# Patient Record
Sex: Female | Born: 1935 | Race: White | Hispanic: No | Smoking: Never smoker
Health system: Southern US, Community
[De-identification: ages and names within clinical notes are randomized; demographics above are authoritative.]

## PROBLEM LIST (undated history)

## (undated) DIAGNOSIS — H409 Unspecified glaucoma: Secondary | ICD-10-CM

## (undated) DIAGNOSIS — J45909 Unspecified asthma, uncomplicated: Secondary | ICD-10-CM

## (undated) DIAGNOSIS — T7840XA Allergy, unspecified, initial encounter: Secondary | ICD-10-CM

## (undated) DIAGNOSIS — E785 Hyperlipidemia, unspecified: Secondary | ICD-10-CM

## (undated) HISTORY — DX: Unspecified asthma, uncomplicated: J45.909

## (undated) HISTORY — DX: Unspecified glaucoma: H40.9

## (undated) HISTORY — DX: Allergy, unspecified, initial encounter: T78.40XA

## (undated) HISTORY — DX: Hyperlipidemia, unspecified: E78.5

## (undated) HISTORY — PX: LOBECTOMY: SHX5089

## (undated) HISTORY — PX: LAMINECTOMY: SHX219

## (undated) HISTORY — PX: ABDOMINAL HYSTERECTOMY: SHX81

---

## 2005-10-04 ENCOUNTER — Encounter: Admission: RE | Admit: 2005-10-04 | Discharge: 2005-10-04 | Payer: Self-pay | Admitting: Neurology

## 2005-10-26 ENCOUNTER — Emergency Department (HOSPITAL_COMMUNITY): Admission: EM | Admit: 2005-10-26 | Discharge: 2005-10-26 | Payer: Self-pay | Admitting: Emergency Medicine

## 2005-11-24 ENCOUNTER — Encounter: Admission: RE | Admit: 2005-11-24 | Discharge: 2005-11-24 | Payer: Self-pay | Admitting: Family Medicine

## 2006-06-03 ENCOUNTER — Encounter: Admission: RE | Admit: 2006-06-03 | Discharge: 2006-06-03 | Payer: Self-pay | Admitting: Family Medicine

## 2007-08-02 IMAGING — CR DG CHEST 2V
2 series · 2 of 2 positions shown · non-contrast
Comparison: none

CLINICAL DATA: Cough and congestion

Chest 2 view:
No previous available for comparison. Linear scarring or atelectasis in the left
lower lobe. Blunting of the left costophrenic angle suggesting small effusion.
Right lung clear. Heart size normal.

[w chest pa *]
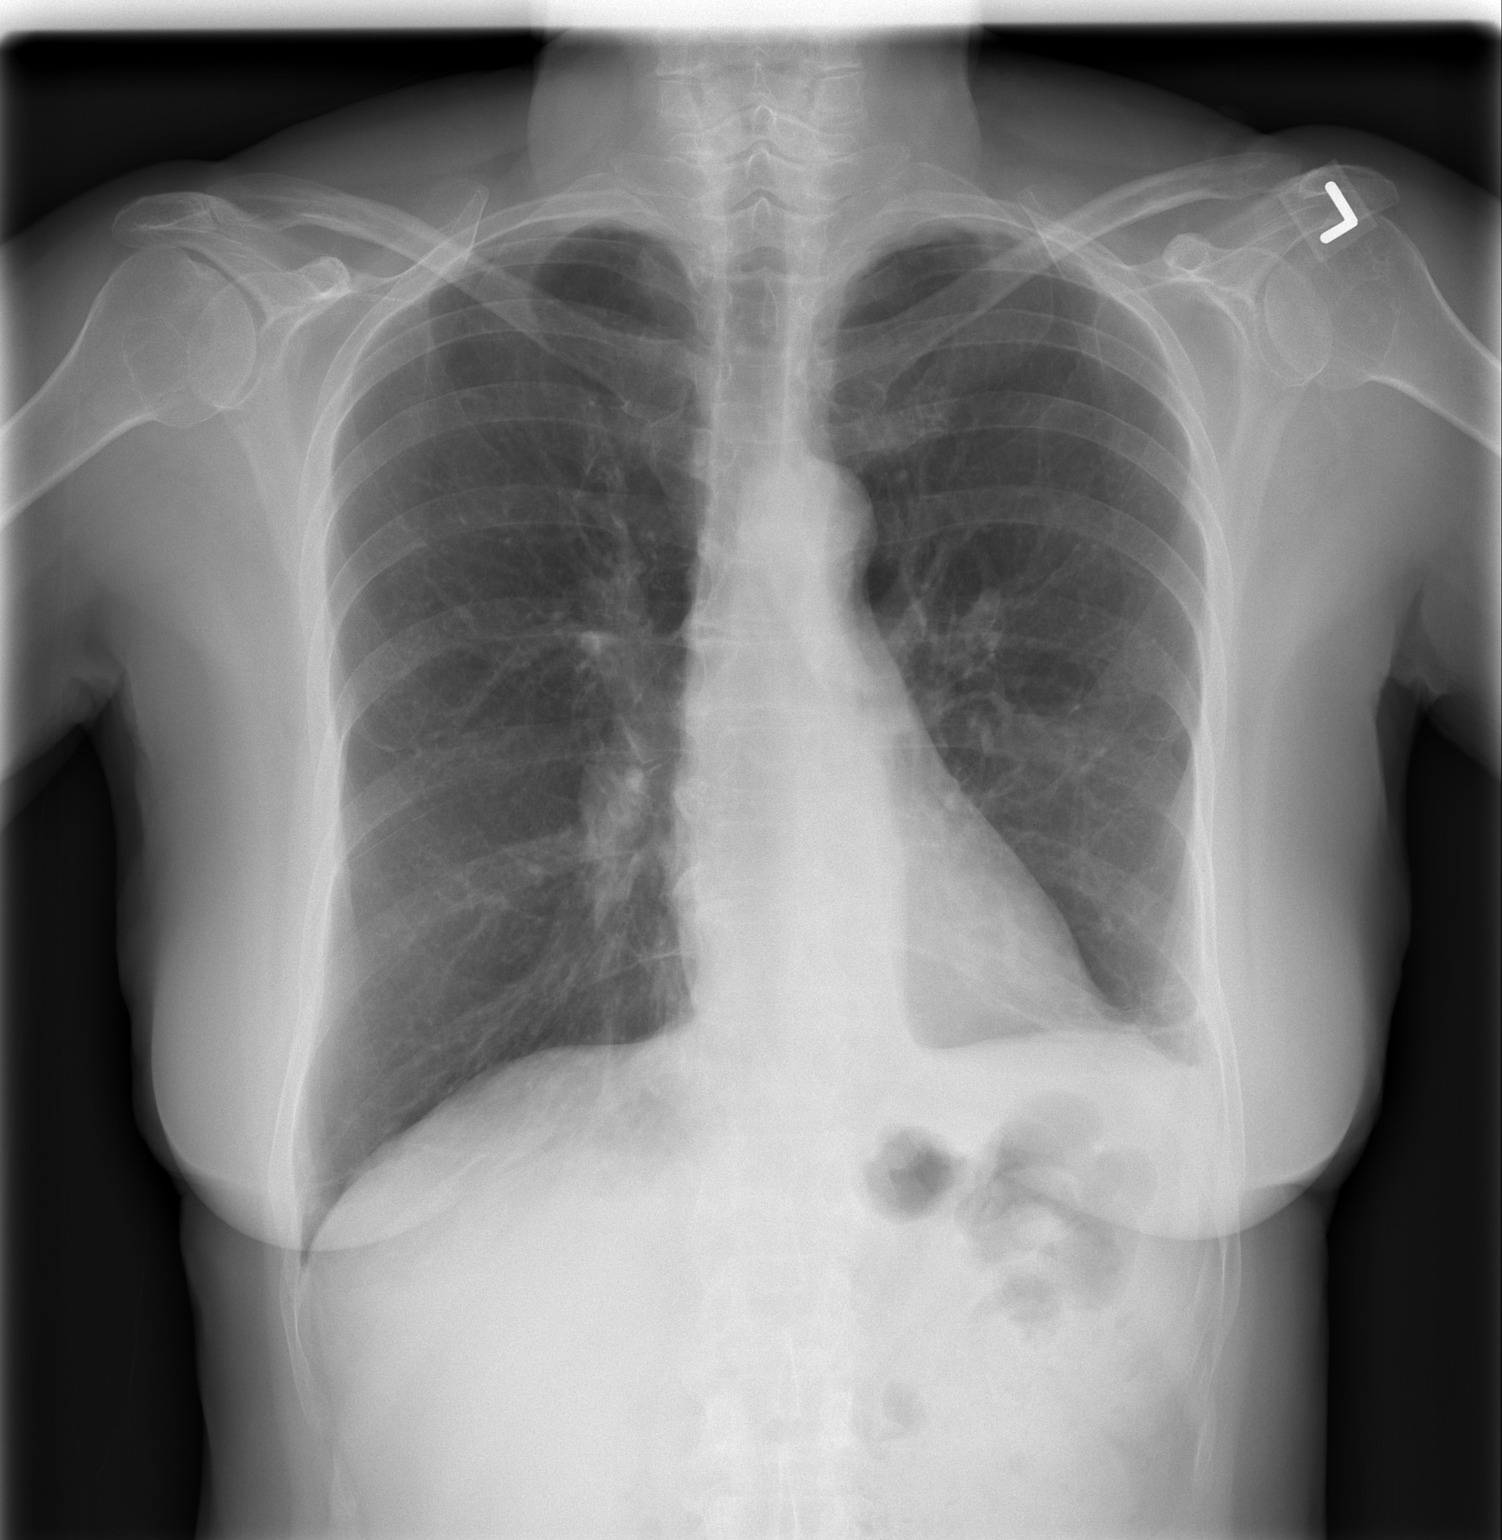

[w chest lat]
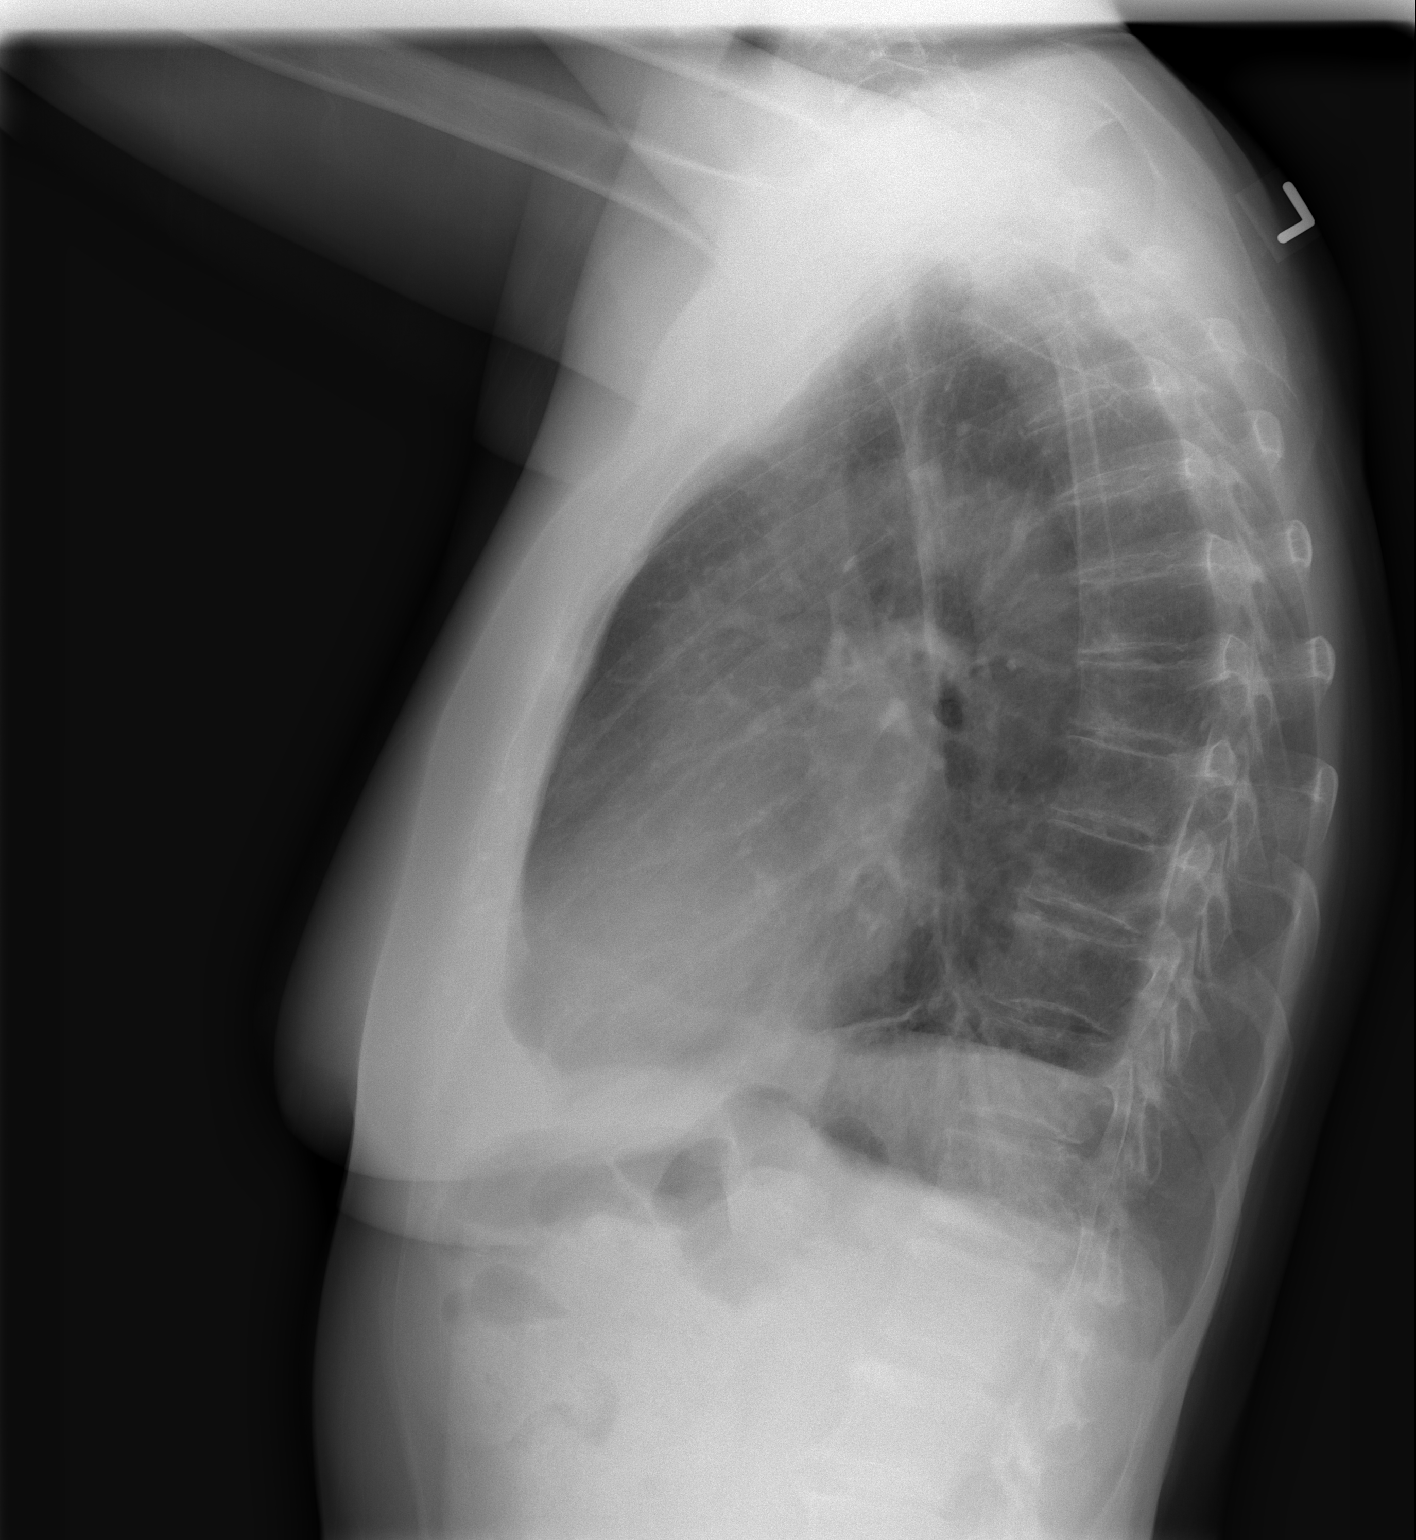

[2 of 2 positions shown; findings below may reference images not displayed]

IMPRESSION: 1. Linear scarring or atelectasis in the left lower lobe with question of small
effusion.

## 2022-03-20 DIAGNOSIS — I10 Essential (primary) hypertension: Secondary | ICD-10-CM | POA: Insufficient documentation

## 2022-03-20 HISTORY — DX: Essential (primary) hypertension: I10

## 2022-03-24 DIAGNOSIS — J449 Chronic obstructive pulmonary disease, unspecified: Secondary | ICD-10-CM | POA: Insufficient documentation

## 2022-03-24 HISTORY — DX: Chronic obstructive pulmonary disease, unspecified: J44.9

## 2022-06-27 ENCOUNTER — Other Ambulatory Visit: Payer: Self-pay

## 2022-06-27 ENCOUNTER — Emergency Department (HOSPITAL_COMMUNITY)
Admission: EM | Admit: 2022-06-27 | Discharge: 2022-06-28 | Disposition: A | Discharge status: Home / Self Care | Payer: Medicare Other | Attending: Emergency Medicine | Admitting: Emergency Medicine

## 2022-06-27 ENCOUNTER — Emergency Department (HOSPITAL_COMMUNITY): Payer: Medicare Other

## 2022-06-27 DIAGNOSIS — S0101XA Laceration without foreign body of scalp, initial encounter: Secondary | ICD-10-CM | POA: Diagnosis not present

## 2022-06-27 DIAGNOSIS — Z23 Encounter for immunization: Secondary | ICD-10-CM | POA: Insufficient documentation

## 2022-06-27 DIAGNOSIS — W01190A Fall on same level from slipping, tripping and stumbling with subsequent striking against furniture, initial encounter: Secondary | ICD-10-CM | POA: Diagnosis not present

## 2022-06-27 DIAGNOSIS — T148XXA Other injury of unspecified body region, initial encounter: Secondary | ICD-10-CM

## 2022-06-27 DIAGNOSIS — S098XXA Other specified injuries of head, initial encounter: Secondary | ICD-10-CM

## 2022-06-27 LAB — BASIC METABOLIC PANEL
Anion gap: 11 (ref 5–15)
BUN: 40 mg/dL — ABNORMAL HIGH (ref 8–23)
CO2: 23 mmol/L (ref 22–32)
Calcium: 9.8 mg/dL (ref 8.9–10.3)
Chloride: 104 mmol/L (ref 98–111)
Creatinine, Ser: 1.31 mg/dL — ABNORMAL HIGH (ref 0.44–1.00)
GFR, Estimated: 40 mL/min — ABNORMAL LOW (ref >60)
Glucose, Bld: 109 mg/dL — ABNORMAL HIGH (ref 70–99)
Potassium: 3.9 mmol/L (ref 3.5–5.1)
Sodium: 138 mmol/L (ref 135–145)

## 2022-06-27 LAB — CBC WITH DIFFERENTIAL/PLATELET
Abs Immature Granulocytes: 0.06 10*3/uL (ref 0.00–0.07)
Basophils Absolute: 0.1 10*3/uL (ref 0.0–0.1)
Basophils Relative: 0 %
Eosinophils Absolute: 0.2 10*3/uL (ref 0.0–0.5)
Eosinophils Relative: 1 %
HCT: 31.2 % — ABNORMAL LOW (ref 36.0–46.0)
Hemoglobin: 10.6 g/dL — ABNORMAL LOW (ref 12.0–15.0)
Immature Granulocytes: 1 %
Lymphocytes Relative: 14 %
Lymphs Abs: 1.9 10*3/uL (ref 0.7–4.0)
MCH: 30.8 pg (ref 26.0–34.0)
MCHC: 34 g/dL (ref 30.0–36.0)
MCV: 90.7 fL (ref 80.0–100.0)
Monocytes Absolute: 1 10*3/uL (ref 0.1–1.0)
Monocytes Relative: 7 %
Neutro Abs: 10 10*3/uL — ABNORMAL HIGH (ref 1.7–7.7)
Neutrophils Relative %: 77 %
Platelets: 378 10*3/uL (ref 150–400)
RBC: 3.44 MIL/uL — ABNORMAL LOW (ref 3.87–5.11)
RDW: 13.1 % (ref 11.5–15.5)
WBC: 13.1 10*3/uL — ABNORMAL HIGH (ref 4.0–10.5)
nRBC: 0 % (ref 0.0–0.2)

## 2022-06-27 MED ORDER — ACETAMINOPHEN 325 MG PO TABS
650.0000 mg | ORAL_TABLET | Freq: Once | ORAL | Status: AC
  Administered 2022-06-27: 650 mg via ORAL
  Filled 2022-06-27: qty 2

## 2022-06-27 MED ORDER — TETANUS-DIPHTH-ACELL PERTUSSIS 5-2.5-18.5 LF-MCG/0.5 IM SUSY
0.5000 mL | PREFILLED_SYRINGE | Freq: Once | INTRAMUSCULAR | Status: AC
  Administered 2022-06-27: 0.5 mL via INTRAMUSCULAR
  Filled 2022-06-27: qty 0.5

## 2022-06-27 NOTE — ED Triage Notes (Signed)
BIB EMS from an independent living facility for a fall from standing, -LOC. Per medic pt has a right 1-2in hematoma above eyebrow, has soaked through abdominal pads. No blood thinners. 100cc of blood estimated on floor on scene

## 2022-06-27 NOTE — ED Provider Notes (Signed)
[facility] EMERGENCY DEPARTMENT Provider Note   CSN: [id number] Arrival date & time: 06/27/22  2222     History  Chief Complaint  Patient presents with   Nicole [name] is a 87 y.o. female.  Patient is an 87 year old female presenting for independent living facility after tripping on her slipper and a rug.  Patient states she fell forward hitting her forehead on her bed.  Patient has large hematoma to the right supraorbital ridge and a laceration.  It was reported to have approximately 100 cc of blood on the floor on EMS arrival.  Patient denies any LOC.  Denies any sensation or motor deficits at this time.  Denies any other bony pain.  Not on blood thinners.  The history is provided by the patient. No language interpreter was used.  Fall Pertinent negatives include no chest pain, no abdominal pain and no shortness of breath.       Home Medications Prior to Admission medications   Not on File      Allergies    Codeine and Penicillins    Review of Systems   Review of Systems  Constitutional:  Negative for chills and fever.  HENT:  Negative for ear pain and sore throat.   Eyes:  Negative for pain and visual disturbance.  Respiratory:  Negative for cough and shortness of breath.   Cardiovascular:  Negative for chest pain and palpitations.  Gastrointestinal:  Negative for abdominal pain and vomiting.  Genitourinary:  Negative for dysuria and hematuria.  Musculoskeletal:  Negative for arthralgias and back pain.  Skin:  Positive for wound. Negative for color change and rash.  Neurological:  Negative for seizures and syncope.  All other systems reviewed and are negative.   Physical Exam Updated Vital Signs BP (!) 149/68 (BP Location: Right Arm)   Pulse (!) 56   Resp 18   SpO2 95%  Physical Exam Vitals and nursing note reviewed.  Constitutional:      General: She is not in acute distress.    Appearance: She is well-developed.  HENT:      Head: Normocephalic.   Eyes:     General: Lids are normal. Vision grossly intact.     Conjunctiva/sclera: Conjunctivae normal.     Pupils: Pupils are equal, round, and reactive to light.     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   Cardiovascular:     Rate and Rhythm: Normal rate and regular rhythm.     Heart sounds: No murmur heard. Pulmonary:     Effort: Pulmonary effort is normal. No respiratory distress.     Breath sounds: Normal breath sounds.  Abdominal:     Palpations: Abdomen is soft.     Tenderness: There is no abdominal tenderness.  Musculoskeletal:        General: No swelling.     Cervical back: Neck supple.  Skin:    General: Skin is warm and dry.     Capillary Refill: Capillary refill takes less than 2 seconds.  Neurological:     Mental Status: She is alert.  Psychiatric:        Mood and Affect: Mood normal.     ED Results / Procedures / Treatments   Labs (all labs ordered are listed, but only abnormal results are displayed) Labs Reviewed  CBC WITH DIFFERENTIAL/PLATELET - Abnormal; Notable for the following components:      Result Value  WBC 13.1 (*)    RBC 3.44 (*)    Hemoglobin 10.6 (*)    HCT 31.2 (*)    Neutro Abs 10.0 (*)    All other components within normal limits  BASIC METABOLIC PANEL    EKG None  Radiology No results found.  Procedures .Marland KitchenLaceration Repair  Date/Time: 06/27/2022 11:26 PM  Performed by: Franne Forts, DO Authorized by: Franne Forts, DO   Consent:    Consent obtained:  Verbal   Consent given by:  Patient   Risks discussed:  Infection and need for additional repair   Alternatives discussed:  No treatment Universal protocol:    Immediately prior to procedure, a time out was called: no     Patient identity confirmed:  Verbally with patient, arm band and provided demographic data Anesthesia:    Anesthesia method:  None Laceration details:    Location:  Scalp    Scalp location:  Frontal   Length (cm):  2 Exploration:    Imaging outcome: foreign body not noted     Wound extent: no muscle damage noted, no nerve damage noted, no tendon damage noted and no vascular damage noted   Treatment:    Area cleansed with:  Saline   Amount of cleaning:  Standard   Irrigation method:  Syringe   Debridement:  None   Undermining:  None   Scar revision: no   Skin repair:    Repair method:  Sutures   Suture size:  5-0   Suture technique:  Simple interrupted   Number of sutures:  3 Approximation:    Approximation:  Close Repair type:    Repair type:  Simple Post-procedure details:    Procedure completion:  Tolerated well, no immediate complications     Medications Ordered in ED Medications  acetaminophen (TYLENOL) tablet 650 mg (650 mg Oral Given 06/27/22 2325)  Tdap (BOOSTRIX) injection 0.5 mL (0.5 mLs Intramuscular Given 06/27/22 2325)    ED Course/ Medical Decision Making/ A&P                           Medical Decision Making Amount and/or Complexity of Data Reviewed Labs: ordered. Radiology: ordered. ECG/medicine tests: ordered.  Risk OTC drugs. Prescription drug management.   50:63 PM  87 year old female presenting for independent living facility after tripping on her slipper and a rug.  Patient is alert oriented x3, no acute distress, afebrile, stable vital signs.  GCS of 15.  No neurovascular deficits.  Physical exam demonstrates 2 cm laceration to the right supraorbital ridge that is not actively bleeding.  No foreign bodies.  Wound irrigated and closed with approximately 3 nonabsorbable sutures.  Patient also has a large right-sided hematoma on forehead.  History of blood thinner use.  Will obtain CT head and neck due to patient's age.  If normal patient is otherwise stable for discharge home.  Patient signed out to oncoming physician while awaiting CT results.         Final Clinical Impression(s) / ED Diagnoses Final diagnoses:   Blunt head trauma, initial encounter  Hematoma  Laceration of scalp, initial encounter    Rx / DC Orders ED Discharge Orders     None         Franne Forts, DO 06/27/22 2331

## 2022-06-28 MED ORDER — AZITHROMYCIN 250 MG PO TABS: ORAL_TABLET | ORAL | 0 refills | Status: DC

## 2022-06-28 NOTE — ED Notes (Signed)
DC papers reviewed. No questions or concerns. No signs of distress. Pt assisted to wheelchair and out to lobby. Appropriate measures for safety taken. 

## 2022-07-16 DIAGNOSIS — E039 Hypothyroidism, unspecified: Secondary | ICD-10-CM | POA: Insufficient documentation

## 2022-07-16 DIAGNOSIS — J479 Bronchiectasis, uncomplicated: Secondary | ICD-10-CM

## 2022-07-16 HISTORY — DX: Bronchiectasis, uncomplicated: J47.9

## 2022-09-29 ENCOUNTER — Inpatient Hospital Stay (HOSPITAL_COMMUNITY)
Admission: EM | Admit: 2022-09-29 | Discharge: 2022-10-08 | DRG: 871 | Disposition: A | Discharge status: Skilled Nursing Facility | Payer: Medicare Other | Source: Skilled Nursing Facility | Attending: Internal Medicine | Admitting: Internal Medicine

## 2022-09-29 ENCOUNTER — Inpatient Hospital Stay (HOSPITAL_COMMUNITY): Payer: Medicare Other

## 2022-09-29 ENCOUNTER — Emergency Department (HOSPITAL_COMMUNITY): Payer: Medicare Other

## 2022-09-29 ENCOUNTER — Encounter (HOSPITAL_COMMUNITY): Payer: Self-pay

## 2022-09-29 ENCOUNTER — Other Ambulatory Visit: Payer: Self-pay

## 2022-09-29 DIAGNOSIS — J9601 Acute respiratory failure with hypoxia: Secondary | ICD-10-CM

## 2022-09-29 DIAGNOSIS — Z79899 Other long term (current) drug therapy: Secondary | ICD-10-CM

## 2022-09-29 DIAGNOSIS — I2699 Other pulmonary embolism without acute cor pulmonale: Secondary | ICD-10-CM | POA: Diagnosis present

## 2022-09-29 DIAGNOSIS — Z1152 Encounter for screening for COVID-19: Secondary | ICD-10-CM

## 2022-09-29 DIAGNOSIS — I959 Hypotension, unspecified: Secondary | ICD-10-CM | POA: Diagnosis not present

## 2022-09-29 DIAGNOSIS — E875 Hyperkalemia: Secondary | ICD-10-CM

## 2022-09-29 DIAGNOSIS — Z681 Body mass index (BMI) 19 or less, adult: Secondary | ICD-10-CM

## 2022-09-29 DIAGNOSIS — K920 Hematemesis: Secondary | ICD-10-CM

## 2022-09-29 DIAGNOSIS — E039 Hypothyroidism, unspecified: Secondary | ICD-10-CM | POA: Diagnosis present

## 2022-09-29 DIAGNOSIS — E872 Acidosis, unspecified: Secondary | ICD-10-CM | POA: Diagnosis present

## 2022-09-29 DIAGNOSIS — J479 Bronchiectasis, uncomplicated: Secondary | ICD-10-CM | POA: Diagnosis present

## 2022-09-29 DIAGNOSIS — E785 Hyperlipidemia, unspecified: Secondary | ICD-10-CM | POA: Diagnosis present

## 2022-09-29 DIAGNOSIS — J690 Pneumonitis due to inhalation of food and vomit: Secondary | ICD-10-CM | POA: Diagnosis present

## 2022-09-29 DIAGNOSIS — G9341 Metabolic encephalopathy: Secondary | ICD-10-CM | POA: Diagnosis present

## 2022-09-29 DIAGNOSIS — Z885 Allergy status to narcotic agent status: Secondary | ICD-10-CM

## 2022-09-29 DIAGNOSIS — R739 Hyperglycemia, unspecified: Secondary | ICD-10-CM | POA: Diagnosis present

## 2022-09-29 DIAGNOSIS — A419 Sepsis, unspecified organism: Principal | ICD-10-CM

## 2022-09-29 DIAGNOSIS — D62 Acute posthemorrhagic anemia: Secondary | ICD-10-CM | POA: Diagnosis present

## 2022-09-29 DIAGNOSIS — R54 Age-related physical debility: Secondary | ICD-10-CM | POA: Diagnosis present

## 2022-09-29 DIAGNOSIS — R6521 Severe sepsis with septic shock: Secondary | ICD-10-CM

## 2022-09-29 DIAGNOSIS — E871 Hypo-osmolality and hyponatremia: Secondary | ICD-10-CM | POA: Diagnosis present

## 2022-09-29 DIAGNOSIS — M19011 Primary osteoarthritis, right shoulder: Secondary | ICD-10-CM | POA: Diagnosis present

## 2022-09-29 DIAGNOSIS — I110 Hypertensive heart disease with heart failure: Secondary | ICD-10-CM | POA: Diagnosis present

## 2022-09-29 DIAGNOSIS — R001 Bradycardia, unspecified: Secondary | ICD-10-CM | POA: Diagnosis present

## 2022-09-29 DIAGNOSIS — J189 Pneumonia, unspecified organism: Secondary | ICD-10-CM

## 2022-09-29 DIAGNOSIS — M19042 Primary osteoarthritis, left hand: Secondary | ICD-10-CM | POA: Diagnosis present

## 2022-09-29 DIAGNOSIS — E440 Moderate protein-calorie malnutrition: Secondary | ICD-10-CM | POA: Insufficient documentation

## 2022-09-29 DIAGNOSIS — R7401 Elevation of levels of liver transaminase levels: Secondary | ICD-10-CM | POA: Diagnosis present

## 2022-09-29 DIAGNOSIS — I4510 Unspecified right bundle-branch block: Secondary | ICD-10-CM | POA: Diagnosis present

## 2022-09-29 DIAGNOSIS — I5033 Acute on chronic diastolic (congestive) heart failure: Secondary | ICD-10-CM | POA: Diagnosis present

## 2022-09-29 DIAGNOSIS — Z66 Do not resuscitate: Secondary | ICD-10-CM | POA: Diagnosis present

## 2022-09-29 DIAGNOSIS — N179 Acute kidney failure, unspecified: Secondary | ICD-10-CM

## 2022-09-29 DIAGNOSIS — N170 Acute kidney failure with tubular necrosis: Secondary | ICD-10-CM | POA: Diagnosis present

## 2022-09-29 DIAGNOSIS — J9600 Acute respiratory failure, unspecified whether with hypoxia or hypercapnia: Secondary | ICD-10-CM

## 2022-09-29 DIAGNOSIS — Z7189 Other specified counseling: Secondary | ICD-10-CM

## 2022-09-29 DIAGNOSIS — Z781 Physical restraint status: Secondary | ICD-10-CM

## 2022-09-29 DIAGNOSIS — J188 Other pneumonia, unspecified organism: Secondary | ICD-10-CM

## 2022-09-29 DIAGNOSIS — Z880 Allergy status to penicillin: Secondary | ICD-10-CM

## 2022-09-29 LAB — URINALYSIS, ROUTINE W REFLEX MICROSCOPIC
Bilirubin Urine: NEGATIVE
Glucose, UA: NEGATIVE mg/dL
Hgb urine dipstick: NEGATIVE
Ketones, ur: NEGATIVE mg/dL
Leukocytes,Ua: NEGATIVE
Nitrite: NEGATIVE
Protein, ur: NEGATIVE mg/dL
Specific Gravity, Urine: 1.003 — ABNORMAL LOW (ref 1.005–1.030)
pH: 7 (ref 5.0–8.0)

## 2022-09-29 LAB — GLUCOSE, CAPILLARY
Glucose-Capillary: 158 mg/dL — ABNORMAL HIGH (ref 70–99)
Glucose-Capillary: 159 mg/dL — ABNORMAL HIGH (ref 70–99)
Glucose-Capillary: 204 mg/dL — ABNORMAL HIGH (ref 70–99)
Glucose-Capillary: 38 mg/dL — CL (ref 70–99)
Glucose-Capillary: 40 mg/dL — CL (ref 70–99)
Glucose-Capillary: 82 mg/dL (ref 70–99)
Glucose-Capillary: 84 mg/dL (ref 70–99)

## 2022-09-29 LAB — HEMOGLOBIN A1C
Hgb A1c MFr Bld: 6.3 % — ABNORMAL HIGH (ref 4.8–5.6)
Mean Plasma Glucose: 134 mg/dL

## 2022-09-29 LAB — I-STAT ARTERIAL BLOOD GAS, ED
Acid-base deficit: 11 mmol/L — ABNORMAL HIGH (ref 0.0–2.0)
Bicarbonate: 14.4 mmol/L — ABNORMAL LOW (ref 20.0–28.0)
Calcium, Ion: 1.06 mmol/L — ABNORMAL LOW (ref 1.15–1.40)
HCT: 27 % — ABNORMAL LOW (ref 36.0–46.0)
Hemoglobin: 9.2 g/dL — ABNORMAL LOW (ref 12.0–15.0)
O2 Saturation: 100 %
Patient temperature: 92.3
Potassium: 6.4 mmol/L (ref 3.5–5.1)
Sodium: 120 mmol/L — ABNORMAL LOW (ref 135–145)
TCO2: 15 mmol/L — ABNORMAL LOW (ref 22–32)
pCO2 arterial: 25.5 mmHg — ABNORMAL LOW (ref 32–48)
pH, Arterial: 7.343 — ABNORMAL LOW (ref 7.35–7.45)
pO2, Arterial: 340 mmHg — ABNORMAL HIGH (ref 83–108)

## 2022-09-29 LAB — BASIC METABOLIC PANEL
Anion gap: 12 (ref 5–15)
Anion gap: 10 (ref 5–15)
Anion gap: 12 (ref 5–15)
BUN: 26 mg/dL — ABNORMAL HIGH (ref 8–23)
BUN: 29 mg/dL — ABNORMAL HIGH (ref 8–23)
BUN: 29 mg/dL — ABNORMAL HIGH (ref 8–23)
CO2: 18 mmol/L — ABNORMAL LOW (ref 22–32)
CO2: 19 mmol/L — ABNORMAL LOW (ref 22–32)
CO2: 18 mmol/L — ABNORMAL LOW (ref 22–32)
Calcium: 8.2 mg/dL — ABNORMAL LOW (ref 8.9–10.3)
Calcium: 8.5 mg/dL — ABNORMAL LOW (ref 8.9–10.3)
Calcium: 8.7 mg/dL — ABNORMAL LOW (ref 8.9–10.3)
Chloride: 95 mmol/L — ABNORMAL LOW (ref 98–111)
Chloride: 96 mmol/L — ABNORMAL LOW (ref 98–111)
Chloride: 98 mmol/L (ref 98–111)
Creatinine, Ser: 1.77 mg/dL — ABNORMAL HIGH (ref 0.44–1.00)
Creatinine, Ser: 1.72 mg/dL — ABNORMAL HIGH (ref 0.44–1.00)
Creatinine, Ser: 1.8 mg/dL — ABNORMAL HIGH (ref 0.44–1.00)
GFR, Estimated: 28 mL/min — ABNORMAL LOW (ref >60)
GFR, Estimated: 29 mL/min — ABNORMAL LOW (ref >60)
GFR, Estimated: 27 mL/min — ABNORMAL LOW (ref >60)
Glucose, Bld: 234 mg/dL — ABNORMAL HIGH (ref 70–99)
Glucose, Bld: 139 mg/dL — ABNORMAL HIGH (ref 70–99)
Glucose, Bld: 28 mg/dL — CL (ref 70–99)
Potassium: 5.5 mmol/L — ABNORMAL HIGH (ref 3.5–5.1)
Potassium: 5.8 mmol/L — ABNORMAL HIGH (ref 3.5–5.1)
Potassium: 4.6 mmol/L (ref 3.5–5.1)
Sodium: 125 mmol/L — ABNORMAL LOW (ref 135–145)
Sodium: 125 mmol/L — ABNORMAL LOW (ref 135–145)
Sodium: 128 mmol/L — ABNORMAL LOW (ref 135–145)

## 2022-09-29 LAB — I-STAT VENOUS BLOOD GAS, ED
Acid-base deficit: 9 mmol/L — ABNORMAL HIGH (ref 0.0–2.0)
Bicarbonate: 17.3 mmol/L — ABNORMAL LOW (ref 20.0–28.0)
Calcium, Ion: 1.08 mmol/L — ABNORMAL LOW (ref 1.15–1.40)
HCT: 30 % — ABNORMAL LOW (ref 36.0–46.0)
Hemoglobin: 10.2 g/dL — ABNORMAL LOW (ref 12.0–15.0)
O2 Saturation: 43 %
Potassium: 6.4 mmol/L (ref 3.5–5.1)
Sodium: 122 mmol/L — ABNORMAL LOW (ref 135–145)
TCO2: 18 mmol/L — ABNORMAL LOW (ref 22–32)
pCO2, Ven: 37.5 mmHg — ABNORMAL LOW (ref 44–60)
pH, Ven: 7.272 (ref 7.25–7.43)
pO2, Ven: 27 mmHg — CL (ref 32–45)

## 2022-09-29 LAB — CBC
HCT: 28.1 % — ABNORMAL LOW (ref 36.0–46.0)
HCT: 25.7 % — ABNORMAL LOW (ref 36.0–46.0)
Hemoglobin: 9.3 g/dL — ABNORMAL LOW (ref 12.0–15.0)
Hemoglobin: 8.4 g/dL — ABNORMAL LOW (ref 12.0–15.0)
MCH: 29.2 pg (ref 26.0–34.0)
MCH: 28.6 pg (ref 26.0–34.0)
MCHC: 33.1 g/dL (ref 30.0–36.0)
MCHC: 32.7 g/dL (ref 30.0–36.0)
MCV: 88.1 fL (ref 80.0–100.0)
MCV: 87.4 fL (ref 80.0–100.0)
Platelets: 490 10*3/uL — ABNORMAL HIGH (ref 150–400)
Platelets: 357 10*3/uL (ref 150–400)
RBC: 3.19 MIL/uL — ABNORMAL LOW (ref 3.87–5.11)
RBC: 2.94 MIL/uL — ABNORMAL LOW (ref 3.87–5.11)
RDW: 14.3 % (ref 11.5–15.5)
RDW: 14.3 % (ref 11.5–15.5)
WBC: 20.8 10*3/uL — ABNORMAL HIGH (ref 4.0–10.5)
WBC: 18.5 10*3/uL — ABNORMAL HIGH (ref 4.0–10.5)
nRBC: 0 % (ref 0.0–0.2)
nRBC: 0 % (ref 0.0–0.2)

## 2022-09-29 LAB — ECHOCARDIOGRAM COMPLETE
AR max vel: 0.72 cm2
AV Area VTI: 0.7 cm2
AV Area mean vel: 0.66 cm2
AV Mean grad: 25 mmHg
AV Peak grad: 38.4 mmHg
Ao pk vel: 3.1 m/s
Area-P 1/2: 3.93 cm2
MV M vel: 5 m/s
MV Peak grad: 100 mmHg
MV VTI: 1.7 cm2
P 1/2 time: 823 msec
S' Lateral: 2.8 cm
Weight: 1788.37 oz

## 2022-09-29 LAB — AMMONIA: Ammonia: 27 umol/L (ref 9–35)

## 2022-09-29 LAB — LACTIC ACID, PLASMA
Lactic Acid, Venous: 5.3 mmol/L (ref 0.5–1.9)
Lactic Acid, Venous: 3.7 mmol/L (ref 0.5–1.9)

## 2022-09-29 LAB — TSH: TSH: 3.466 u[IU]/mL (ref 0.350–4.500)

## 2022-09-29 LAB — COMPREHENSIVE METABOLIC PANEL
ALT: 109 U/L — ABNORMAL HIGH (ref 0–44)
AST: 188 U/L — ABNORMAL HIGH (ref 15–41)
Albumin: 3.4 g/dL — ABNORMAL LOW (ref 3.5–5.0)
Alkaline Phosphatase: 111 U/L (ref 38–126)
Anion gap: 15 (ref 5–15)
BUN: 25 mg/dL — ABNORMAL HIGH (ref 8–23)
CO2: 17 mmol/L — ABNORMAL LOW (ref 22–32)
Calcium: 8.6 mg/dL — ABNORMAL LOW (ref 8.9–10.3)
Chloride: 93 mmol/L — ABNORMAL LOW (ref 98–111)
Creatinine, Ser: 1.92 mg/dL — ABNORMAL HIGH (ref 0.44–1.00)
GFR, Estimated: 25 mL/min — ABNORMAL LOW (ref >60)
Glucose, Bld: 229 mg/dL — ABNORMAL HIGH (ref 70–99)
Potassium: 6.6 mmol/L (ref 3.5–5.1)
Sodium: 125 mmol/L — ABNORMAL LOW (ref 135–145)
Total Bilirubin: 0.6 mg/dL (ref 0.3–1.2)
Total Protein: 6.3 g/dL — ABNORMAL LOW (ref 6.5–8.1)

## 2022-09-29 LAB — PROTIME-INR
INR: 1.1 (ref 0.8–1.2)
Prothrombin Time: 14.1 seconds (ref 11.4–15.2)

## 2022-09-29 LAB — RESP PANEL BY RT-PCR (RSV, FLU A&B, COVID)  RVPGX2
Influenza A by PCR: NEGATIVE
Influenza B by PCR: NEGATIVE
Resp Syncytial Virus by PCR: NEGATIVE
SARS Coronavirus 2 by RT PCR: NEGATIVE

## 2022-09-29 LAB — TYPE AND SCREEN
ABO/RH(D): A NEG
Antibody Screen: NEGATIVE

## 2022-09-29 LAB — MRSA NEXT GEN BY PCR, NASAL: MRSA by PCR Next Gen: NOT DETECTED

## 2022-09-29 LAB — MAGNESIUM: Magnesium: 2.1 mg/dL (ref 1.7–2.4)

## 2022-09-29 LAB — TROPONIN I (HIGH SENSITIVITY)
Troponin I (High Sensitivity): 8 ng/L (ref <18)
Troponin I (High Sensitivity): 8 ng/L (ref <18)

## 2022-09-29 LAB — ABO/RH: ABO/RH(D): A NEG

## 2022-09-29 LAB — LIPASE, BLOOD: Lipase: 32 U/L (ref 11–51)

## 2022-09-29 LAB — SODIUM, URINE, RANDOM: Sodium, Ur: 38 mmol/L

## 2022-09-29 LAB — PHOSPHORUS: Phosphorus: 3.9 mg/dL (ref 2.5–4.6)

## 2022-09-29 LAB — OSMOLALITY, URINE: Osmolality, Ur: 191 mOsm/kg — ABNORMAL LOW (ref 300–900)

## 2022-09-29 MED ORDER — FENTANYL CITRATE PF 50 MCG/ML IJ SOSY: 25.0000 ug | PREFILLED_SYRINGE | INTRAMUSCULAR | Status: DC | PRN

## 2022-09-29 MED ORDER — INSULIN ASPART 100 UNIT/ML IV SOLN
10.0000 [IU] | Freq: Once | INTRAVENOUS | Status: AC
  Administered 2022-09-29: 10 [IU] via INTRAVENOUS

## 2022-09-29 MED ORDER — SODIUM ZIRCONIUM CYCLOSILICATE 10 G PO PACK
10.0000 g | PACK | Freq: Once | ORAL | Status: AC
  Administered 2022-09-29: 10 g
  Filled 2022-09-29: qty 1

## 2022-09-29 MED ORDER — PANTOPRAZOLE SODIUM 40 MG IV SOLR
40.0000 mg | Freq: Two times a day (BID) | INTRAVENOUS | Status: DC
  Administered 2022-09-29 – 2022-10-06 (×14): 40 mg via INTRAVENOUS
  Filled 2022-09-29 (×15): qty 10

## 2022-09-29 MED ORDER — SODIUM CHLORIDE 0.9 % IV SOLN
500.0000 mg | INTRAVENOUS | Status: DC
  Administered 2022-09-29 – 2022-09-30 (×2): 500 mg via INTRAVENOUS
  Filled 2022-09-29 (×3): qty 5

## 2022-09-29 MED ORDER — PROPOFOL 1000 MG/100ML IV EMUL
5.0000 ug/kg/min | INTRAVENOUS | Status: DC
  Administered 2022-09-29: 35 ug/kg/min via INTRAVENOUS
  Administered 2022-09-29 – 2022-09-30 (×2): 30 ug/kg/min via INTRAVENOUS
  Filled 2022-09-29 (×2): qty 100

## 2022-09-29 MED ORDER — ETOMIDATE 2 MG/ML IV SOLN
INTRAVENOUS | Status: DC | PRN
  Administered 2022-09-29: 10 mg via INTRAVENOUS

## 2022-09-29 MED ORDER — CHLORHEXIDINE GLUCONATE CLOTH 2 % EX PADS
6.0000 | MEDICATED_PAD | Freq: Every day | CUTANEOUS | Status: DC
  Administered 2022-09-29 – 2022-09-30 (×2): 6 via TOPICAL

## 2022-09-29 MED ORDER — DOCUSATE SODIUM 50 MG/5ML PO LIQD: 100.0000 mg | Freq: Two times a day (BID) | ORAL | Status: DC | PRN

## 2022-09-29 MED ORDER — SODIUM CHLORIDE 0.9 % IV SOLN
2.0000 g | INTRAVENOUS | Status: AC
  Administered 2022-09-29 – 2022-10-06 (×8): 2 g via INTRAVENOUS
  Filled 2022-09-29 (×8): qty 20

## 2022-09-29 MED ORDER — DEXTROSE 50 % IV SOLN
25.0000 mL | Freq: Once | INTRAVENOUS | Status: AC
  Administered 2022-09-29: 25 mL via INTRAVENOUS
  Filled 2022-09-29: qty 50

## 2022-09-29 MED ORDER — DEXTROSE 50 % IV SOLN
INTRAVENOUS | Status: AC
  Administered 2022-09-29: 50 mL via INTRAVENOUS
  Filled 2022-09-29: qty 50

## 2022-09-29 MED ORDER — POLYETHYLENE GLYCOL 3350 17 G PO PACK
17.0000 g | PACK | Freq: Every day | ORAL | Status: DC | PRN
  Filled 2022-09-29: qty 1

## 2022-09-29 MED ORDER — INSULIN ASPART 100 UNIT/ML IJ SOLN
0.0000 [IU] | INTRAMUSCULAR | Status: DC
  Administered 2022-09-29: 2 [IU] via SUBCUTANEOUS
  Administered 2022-09-29: 3 [IU] via SUBCUTANEOUS

## 2022-09-29 MED ORDER — SODIUM CHLORIDE 0.9 % IV BOLUS
1000.0000 mL | Freq: Once | INTRAVENOUS | Status: AC
  Administered 2022-09-29: 1000 mL via INTRAVENOUS

## 2022-09-29 MED ORDER — CALCIUM GLUCONATE-NACL 1-0.675 GM/50ML-% IV SOLN
1.0000 g | Freq: Once | INTRAVENOUS | Status: AC
  Administered 2022-09-29: 1000 mg via INTRAVENOUS
  Filled 2022-09-29: qty 50

## 2022-09-29 MED ORDER — NOREPINEPHRINE 4 MG/250ML-% IV SOLN
0.0000 ug/min | INTRAVENOUS | Status: DC
  Administered 2022-09-29: 30 ug/min via INTRAVENOUS
  Filled 2022-09-29: qty 250

## 2022-09-29 MED ORDER — LACTATED RINGERS IV SOLN: INTRAVENOUS | Status: DC

## 2022-09-29 MED ORDER — SODIUM CHLORIDE 0.9 % IV SOLN
2.0000 g | Freq: Once | INTRAVENOUS | Status: AC
  Administered 2022-09-29: 2 g via INTRAVENOUS
  Filled 2022-09-29: qty 12.5

## 2022-09-29 MED ORDER — ROCURONIUM BROMIDE 50 MG/5ML IV SOLN
INTRAVENOUS | Status: DC | PRN
  Administered 2022-09-29: 80 mg via INTRAVENOUS

## 2022-09-29 MED ORDER — DEXTROSE 50 % IV SOLN: 1.0000 | Freq: Once | INTRAVENOUS | Status: AC

## 2022-09-29 MED ORDER — VANCOMYCIN HCL 1250 MG/250ML IV SOLN
1250.0000 mg | Freq: Once | INTRAVENOUS | Status: AC
  Administered 2022-09-29: 1250 mg via INTRAVENOUS
  Filled 2022-09-29: qty 250

## 2022-09-29 MED ORDER — ORAL CARE MOUTH RINSE
15.0000 mL | OROMUCOSAL | Status: DC
  Administered 2022-09-29 – 2022-09-30 (×17): 15 mL via OROMUCOSAL

## 2022-09-29 MED ORDER — ORAL CARE MOUTH RINSE: 15.0000 mL | OROMUCOSAL | Status: DC | PRN

## 2022-09-29 MED ORDER — INSULIN ASPART 100 UNIT/ML IV SOLN
5.0000 [IU] | Freq: Once | INTRAVENOUS | Status: AC
  Administered 2022-09-29: 5 [IU] via INTRAVENOUS

## 2022-09-29 MED ORDER — DEXTROSE 50 % IV SOLN
1.0000 | Freq: Once | INTRAVENOUS | Status: AC
  Administered 2022-09-29: 50 mL via INTRAVENOUS
  Filled 2022-09-29: qty 50

## 2022-09-29 MED ORDER — FENTANYL CITRATE PF 50 MCG/ML IJ SOSY
25.0000 ug | PREFILLED_SYRINGE | INTRAMUSCULAR | Status: DC | PRN
  Administered 2022-09-29 – 2022-09-30 (×4): 50 ug via INTRAVENOUS
  Filled 2022-09-29 (×4): qty 1

## 2022-09-29 MED ORDER — SODIUM CHLORIDE 0.9 % IV SOLN: 2.0000 g | Freq: Two times a day (BID) | INTRAVENOUS | Status: DC

## 2022-09-29 MED ORDER — PROPOFOL 1000 MG/100ML IV EMUL
INTRAVENOUS | Status: AC
  Administered 2022-09-29: 5 ug/kg/min
  Filled 2022-09-29: qty 100

## 2022-09-29 MED ORDER — VANCOMYCIN VARIABLE DOSE PER UNSTABLE RENAL FUNCTION (PHARMACIST DOSING): Status: DC

## 2022-09-29 NOTE — Consult Note (Signed)
Cardiology Consultation   Patient ID: Nicole [name] MRN: [id number]; DOB: 1935/04/07  Admit date: 09/29/2022 Date of Consult: 09/29/2022  PCP:  Mercy Moore, MD   [facility] Providers Cardiologist:  None        Patient Profile:   Nicole [name] is a 87 y.o. female with unknown medical history who is being seen 09/29/2022 for the evaluation of bradycardia at the request of Dr. Pilar Plate.  History of Present Illness:   Of note, Nicole [name] is transferred from her nursing facility for evaluation of respiratory distress. When I see the patient, she is alert, awake, however disoriented with blood in her mouth, and ED team is ready to intubate her. Limited medical history was obtained from medical staff. Patient was found be in respiratory distress and have hematemesis. She was given NS and atropine in route to our ED. ECG and telemetry on admission demonstrated baseline artifacts which appeared to junctional rhythm with baseline RBBB vs. Ventricular escape rhythm (40-50s)  ED, Na 125, K 6.6, Cr 1.92, AST/ALT 188/109, lactic acid 5.3, troponin 8, CXR showed possible pneumonia with normal heart size  History reviewed. No pertinent past medical history.  History reviewed. No pertinent surgical history.    Inpatient Medications: Scheduled Meds:  insulin aspart  5 Units Intravenous Once   And   dextrose  1 ampule Intravenous Once   Continuous Infusions:  ceFEPime (MAXIPIME) IV     lactated ringers     norepinephrine (LEVOPHED) Adult infusion Stopped (09/29/22 0258)   PRN Meds: etomidate, rocuronium  Allergies:    Allergies  Allergen Reactions   Codeine    Penicillins     Social History:   Social History   Socioeconomic History   Marital status: Married    Spouse name: Not on file   Number of children: Not on file   Years of education: Not on file   Highest education level: Not on file  Occupational History   Not on file  Tobacco Use   Smoking status: Not on  file   Smokeless tobacco: Not on file  Substance and Sexual Activity   Alcohol use: Not on file   Drug use: Not on file   Sexual activity: Not on file  Other Topics Concern   Not on file  Social History Narrative   Not on file   Social Determinants of Health   Financial Resource Strain: Not on file  Food Insecurity: Not on file  Transportation Needs: Not on file  Physical Activity: Not on file  Stress: Not on file  Social Connections: Not on file  Intimate Partner Violence: Not on file    Family History:   History reviewed. No pertinent family history.   ROS:  Please see the history of present illness.   All other ROS reviewed and negative.     Physical Exam/Data:   Vitals:   09/29/22 0215 09/29/22 0220 09/29/22 0224 09/29/22 0225  BP: (!) 191/88 (!) 196/91  (!) 185/88  Pulse: (!) 44 (!) 44 (!) 44 (!) 44  Resp: 16 17 16 16   SpO2: 100% 100% 100% 100%   No intake or output data in the 24 hours ending 09/29/22 0301     No data to display           There is no height or weight on file to calculate BMI.  General:  fragile, in acute respiratory distress HEENT: normal Neck: no JVD Vascular: No carotid bruits; Distal pulses 2+ bilaterally  Cardiac:  normal S1, S2; RRR; no murmur  Lungs:  decreased breath sounds  Abd: soft, nontender, no hepatomegaly  Ext: no edema Musculoskeletal:  seems to move extremities freely Skin: warm and dry  Neuro:  unable to assess  Relevant CV Studies:   Laboratory Data:  High Sensitivity Troponin:   Recent Labs  Lab 09/29/22 0124  TROPONINIHS 8     Chemistry Recent Labs  Lab 09/29/22 0124 09/29/22 0150 09/29/22 0250  NA 125* 122* 120*  K 6.6* 6.4* 6.4*  CL 93*  --   --   CO2 17*  --   --   GLUCOSE 229*  --   --   BUN 25*  --   --   CREATININE 1.92*  --   --   CALCIUM 8.6*  --   --   GFRNONAA 25*  --   --   ANIONGAP 15  --   --     Recent Labs  Lab 09/29/22 0124  PROT 6.3*  ALBUMIN 3.4*  AST 188*  ALT 109*   ALKPHOS 111  BILITOT 0.6   Lipids No results for input(s): "CHOL", "TRIG", "HDL", "LABVLDL", "LDLCALC", "CHOLHDL" in the last 168 hours.  Hematology Recent Labs  Lab 09/29/22 0124 09/29/22 0150 09/29/22 0250  WBC 20.8*  --   --   RBC 3.19*  --   --   HGB 9.3* 10.2* 9.2*  HCT 28.1* 30.0* 27.0*  MCV 88.1  --   --   MCH 29.2  --   --   MCHC 33.1  --   --   RDW 14.3  --   --   PLT 490*  --   --    Thyroid No results for input(s): "TSH", "FREET4" in the last 168 hours.  BNPNo results for input(s): "BNP", "PROBNP" in the last 168 hours.  DDimer No results for input(s): "DDIMER" in the last 168 hours.   Radiology/Studies:  DG Chest Portable 1 View  Result Date: 09/29/2022 CLINICAL DATA:  Intubation. EXAM: PORTABLE CHEST 1 VIEW COMPARISON:  09/29/2022. FINDINGS: The heart size and mediastinal contours are stable. There is atherosclerotic calcification of the aorta. Patchy opacities are present at the lung bases bilaterally. There is blunting of the costophrenic angles, possible small pleural effusions. No pneumothorax. Apical pleural thickening is unchanged. Degenerative changes are present in the thoracic spine. The distal tip of the endotracheal tube terminates 2.9 cm above the carina. IMPRESSION: 1. The endotracheal tube terminates 2.9 cm above the carina. 2. Remaining findings are unchanged. Electronically Signed   By: Thornell Sartorius M.D.   On: 09/29/2022 02:18   DG Chest Port 1 View  Result Date: 09/29/2022 CLINICAL DATA:  Hematemesis. EXAM: PORTABLE CHEST 1 VIEW COMPARISON:  Remote radiograph 11/24/2005 FINDINGS: Rotated exam. The heart is normal in size. There are patchy ill-defined opacities at both lung bases. Blunting of both costophrenic angles may represent small effusions. Mild biapical pleuroparenchymal scarring. No pulmonary edema. Limited assessment, no acute osseous findings IMPRESSION: 1. Patchy ill-defined opacities at both lung bases, suspicious for pneumonia. Recommend  radiographic follow-up to resolution. 2. Possible small bilateral pleural effusions. Electronically Signed   By: Narda Rutherford M.D.   On: 09/29/2022 01:46     Assessment and Plan:   #Bradycardia -in the setting of electrolytes abnormalities -ECG and telemetry on admission demonstrated baseline artifacts which appeared to be junctional rhythm with baseline RBBB vs. Ventricular escape rhythm (40-50s), no prior ECG in our system -No need for temp  pacing or permanent pacing at this time -Hold any AVN blocking agents -place zoll patch -acquire a TTE -acquire a co-oximetry from her central line, trend troponin, creatinine and LFTs -We will follow along and reassess if pacing is going to be needed once metabolic abnormalities are corrected  Risk Assessment/Risk Scores:     For questions or updates, please contact [facility] Please consult [email or web address] for contact info under    Signed, Filiberto Pinks, MD  09/29/2022 3:01 AM

## 2022-09-29 NOTE — Progress Notes (Signed)
eLink Physician-Brief Progress Note Patient Name: Nicole [name] DOB: Dec 09, 1934 MRN: [id number]   Date of Service  09/29/2022  HPI/Events of Note  Agitation - Nursing request for bilateral soft wrist restraints.   eICU Interventions  Will order bilateral soft wrist restraints X 13 hours.      Intervention Category Major Interventions: Delirium, psychosis, severe agitation - evaluation and management  Tyffani Foglesong Eugene 09/29/2022, 7:47 PM

## 2022-09-29 NOTE — Progress Notes (Addendum)
Initial Nutrition Assessment  DOCUMENTATION CODES:   Non-severe (moderate) malnutrition in context of social or environmental circumstances  INTERVENTION:  Per abdominal x-ray 12/11, side-port of OG tube is at GE junction and advancement is recommended.  Plan is for consideration of trickle tube feeds tomorrow.  Once pt appropriate for enteral nutrition and OG tube has been advanced and verified recommend: -Initiate Osmolite 1.2 at 15 mL/hour -Once appropriate for advancement, advance by 10 mL/hour every 8 hours to goal rate of Osmolite 1.2 at 55 mL/hour -Provides: 1584 kcal, 73 grams protein, 1003 mL H2O daily  NUTRITION DIAGNOSIS:   Moderate Malnutrition related to social / environmental circumstances (inadequate oral intake, decreasing appetite) as evidenced by mild fat depletion, mild muscle depletion, moderate muscle depletion.  GOAL:   Provide needs based on ASPEN/SCCM guidelines  MONITOR:   Vent status, Labs, Weight trends, TF tolerance, I & O's  REASON FOR ASSESSMENT:   Ventilator    ASSESSMENT:   87 year old female with no significant PMHx admitted from independent living facility with acute hypoxic respiratory failure due to PNA, acute renal failure, hyperkalemia, hyponatremia, anemia, and report of coffee-ground emesis. After intubation paperwork arrived with a portable DNR (this paperwork wasn't initially present on arrival).  Met with patient's family at bedside. Daughter reports patient has had a decreasing appetite for a while now, but would always eat a good breakfast. She would typically have oatmeal with banana and bacon. She has difficulty cutting meat due to arthritis, but could tolerate the meat and meals at her facility as they were soft and more tender. She had just been eating less at meals than her baseline intake was. She also enjoys hot tea, apple juice, and cookies.   Daughter reports patient has likely had weight loss over time. UBW was around 130  lbs for most of adult life. Currently documented to be 50.7 kg (111.77 lbs) per chart. Daughter thought she weighed 106 lbs at a recent appointment, so will need to continue to monitor. No other weights available in chart to trend at this time. Height was not entered in chart at time of RD assessment. Family reports her height is 5'.   Patient is currently intubated on ventilator support MV: 7 L/min Temp (24hrs), Avg:93.6 F (34.2 C), Min:92 F (33.3 C), Max:99.7 F (37.6 C)  Propofol: 9.13 ml/hr (241 kcal daily)  Medications reviewed and include: Novolog 0-9 units Q4hrs, pantoprazole, azithromycin, ceftriaxone, norepinephrine gtt now off, propofol gtt.  Labs reviewed: CBG 158-204, Sodium 125, Potassium 5.8, Chloride 96, CO2 19, BUN 29, Creatinine 1.72.  Enteral Access: 16 Fr. OGT placed 09/29/22; 65 cm at corner of mouth; catheter is in stomach but proximal side port lies at GE junction and could be advanced deeper per abdominal x-ray 09/29/22 Discussed with RN via secure chat regarding recommendation for advancement  UOP: 400 mL  I/O: +1069.4 mL since admission  Discussed with RN. Also discussed with PA. Plan is to hold off on initiation of enteral nutrition today. Family does not want aggressive measured. Plan is for time limited trial on ventilator and then possibly consider one way extubation. May be able to consider initiating trickle tube feeds tomorrow pending plan of care. Patient with report of coffee ground emesis on admission.  NUTRITION - FOCUSED PHYSICAL EXAM:  Flowsheet Row Most Recent Value  Orbital Region Mild depletion  Upper Arm Region Moderate depletion  Thoracic and Lumbar Region Mild depletion  Buccal Region Unable to assess  Temple Region Moderate depletion  Clavicle Bone Region Mild depletion  Clavicle and Acromion Bone Region Moderate depletion  Scapular Bone Region Unable to assess  Dorsal Hand Mild depletion  Patellar Region Moderate depletion  Anterior  Thigh Region Moderate depletion  Posterior Calf Region Moderate depletion  Edema (RD Assessment) None  Hair Reviewed  Eyes Unable to assess  Mouth Unable to assess  Skin Reviewed  Nails Reviewed       Diet Order:   Diet Order             Diet NPO time specified  Diet effective now                  EDUCATION NEEDS:   No education needs have been identified at this time  Skin:  Skin Assessment: Reviewed RN Assessment  Last BM:  Unknown/PTA  Height:   Ht Readings from Last 1 Encounters:  09/29/22 5' (1.524 m)   Weight:   Wt Readings from Last 1 Encounters:  09/29/22 50.7 kg   Ideal Body Weight:  45.5 kg  BMI:  Body mass index is 21.83 kg/m.  Estimated Nutritional Needs:   Kcal:  1500-1700  Protein:  70-85 grams  Fluid:  1.5-1.7 L/day  Loanne Drilling, MS, RD, LDN, CNSC Pager number available on Amion

## 2022-09-29 NOTE — ED Triage Notes (Signed)
Patient arrives via EMS from [facility] side [facility] for respiratory distress. On scene EMS reports patients heart rate was 39BPM, unable to palpate an BP. Patient was given NS, 3mg  atropine at 0045, and Epi drip started at 0050. Patient is currently on NRB sitting straight up int he bed. Patient is also vomiting. Emesis is coffee ground and dark.

## 2022-09-29 NOTE — Progress Notes (Signed)
  Echocardiogram 2D Echocardiogram has been performed.  Maren Reamer 09/29/2022, 9:54 AM

## 2022-09-29 NOTE — IPAL (Signed)
  Interdisciplinary Goals of Care Family Meeting   Date carried out:: 09/29/2022  Location of the meeting: Bedside  Member's involved: Bedside Registered Nurse, Family Member or next of kin, and Other: PA-C  Durable Power of Attorney or acting medical decision maker: Daughter [name]    Discussion: We discussed goals of care for Nicole [name] .  Spoke with Selena Batten and her husband at bedside. Updated on patients current condition. Let them know we attempted an SBT and she got tachypneic. Family does not want aggressive measures and would not want trach performed. They do not like seeing patient with an ETT in place and want to know when that can be removed. Recommended to family that we give a time limited trial on the ventilator given patient time to heal with antibiotics and do a one way extubation some time this week. If patients hemodynamic status worsens, will notify family and to discuss comfort options. Family is adament that they want patient to be comfortable but okay with the current plan on giving a few days of time and then transition to comfort if she doesn't improve.   Code status: Full DNR  Disposition: Continue current acute care   Time spent for the meeting: 35 minutes  Lidia Collum 09/29/2022, 11:53 AM

## 2022-09-29 NOTE — ED Provider Notes (Addendum)
Nicole [name], Nicole [name], Nicole [name]   History [name] Present Illness   Nicole [name] is a 87 y.o. year-old female with unknown past medical history presenting to the ED with chief complaint [name] [name].  [name], Nicole [name], Nicole [name] [name].  Nicole [name] Nicole hypotension noted by EMS.  Review [name] Systems  I was unable to obtain a full/accurate HPI, PMH, or ROS due to the patient's altered mental status.  Patient's Health History   History reviewed. No pertinent past medical history.  History reviewed. No pertinent surgical history.  History reviewed. No pertinent family history.  Social History   Socioeconomic History   Marital status: Married    Spouse name: Not on file   Number [name] children: Not on file   Years [name] education: Not on file   Highest education level: Not on file  Occupational History   Not on file  Tobacco Use   Smoking status: Not on file   Smokeless tobacco: Not on file  Substance Nicole Sexual Activity   Alcohol use: Not on file   Drug use: Not on file   Sexual activity: Not on file  Other Topics Concern   Not on file  Social History Narrative   Not on file   Social Determinants [name] Health   Financial Resource Strain: Not on file  Food Insecurity: Not on file  Transportation Needs: Not on file  Physical Activity: Not on file  Stress: Not on file  Social Connections: Not on file  Intimate Partner Violence: Not on file     Physical Exam   Vitals:   09/29/22 0224 09/29/22 0225  BP:  (!) 185/88  Pulse: (!) 44 (!) 44  Resp: 16 16  SpO2: 100% 100%    CONSTITUTIONAL: Ill-appearing NEURO/PSYCH: Alert, not oriented, moves all extremities EYES:  eyes equal Nicole reactive ENT/NECK:  no LAD, no JVD CARDIO: Bradycardic rate, poorly perfused PULM:  CTAB no wheezing  or rhonchi GI/GU:  non-distended, non-tender MSK/SPINE:  No gross deformities, no edema SKIN:  no rash, atraumatic   *Additional Nicole/or pertinent findings included in MDM below  Diagnostic Nicole Interventional Summary    EKG Interpretation  Date/Time:  Monday September 29 2022 01:11:39 EST Ventricular Rate:  47 PR Interval:    QRS Duration: 155 QT Interval:  496 QTC Calculation: 439 R Axis:   97 Text Interpretation: Junctional Nicole [name] right bundle branch block Probable lateral infarct, age indeterminate Confirmed by Gerlene Fee [phone]) on 09/29/2022 3:01:56 AM       Labs Reviewed  CBC - Abnormal; Notable for the following components:      Result Value   WBC 20.8 (*)    RBC 3.19 (*)    Hemoglobin 9.3 (*)    HCT 28.1 (*)    Platelets 490 (*)    All other components within normal limits  COMPREHENSIVE METABOLIC PANEL - Abnormal; Notable for the following components:   Sodium 125 (*)    Potassium 6.6 (*)    Chloride 93 (*)    CO2 17 (*)    Glucose, Bld 229 (*)    BUN 25 (*)    Creatinine, Ser 1.92 (*)    Calcium 8.6 (*)    Total Protein 6.3 (*)    Albumin 3.4 (*)    AST 188 (*)  ALT 109 (*)    GFR, Estimated 25 (*)    All other components within normal limits  LACTIC ACID, PLASMA - Abnormal; Notable for the following components:   Lactic Acid, Venous 5.3 (*)    All other components within normal limits  I-STAT VENOUS BLOOD GAS, ED - Abnormal; Notable for the following components:   pCO2, Ven 37.5 (*)    pO2, Ven 27 (*)    Bicarbonate 17.3 (*)    TCO2 18 (*)    Acid-base deficit 9.0 (*)    Sodium 122 (*)    Potassium 6.4 (*)    Calcium, Ion 1.08 (*)    HCT 30.0 (*)    Hemoglobin 10.2 (*)    All other components within normal limits  I-STAT ARTERIAL BLOOD GAS, ED - Abnormal; Notable for the following components:   pH, Arterial 7.343 (*)    pCO2 arterial 25.5 (*)    pO2, Arterial 340 (*)    Bicarbonate 14.4 (*)    TCO2 15 (*)    Acid-base deficit  11.0 (*)    Sodium 120 (*)    Potassium 6.4 (*)    Calcium, Ion 1.06 (*)    HCT 27.0 (*)    Hemoglobin 9.2 (*)    All other components within normal limits  CULTURE, BLOOD (ROUTINE X 2)  CULTURE, BLOOD (ROUTINE X 2)  PROTIME-INR  LIPASE, BLOOD  URINALYSIS, ROUTINE W REFLEX MICROSCOPIC  TYPE Nicole SCREEN  ABO/RH  TROPONIN I (HIGH SENSITIVITY)  TROPONIN I (HIGH SENSITIVITY)    DG Chest Portable 1 View  Final Result    DG Chest Port 1 View  Final Result    DG Abdomen 1 View    (Results Pending)    Medications  norepinephrine (LEVOPHED) 49m in 2542m(0.016 mg/mL) premix infusion (0 mcg/min Intravenous Stopped 09/29/22 0258)  etomidate (AMIDATE) injection (10 mg Intravenous Given 09/29/22 0205)  rocuronium (ZEMURON) injection (80 mg Intravenous Given 09/29/22 0205)  insulin aspart (novoLOG) injection 5 Units (has no administration in time range)    Nicole  dextrose 50 % solution 50 mL (has no administration in time range)  ceFEPIme (MAXIPIME) 2 g in sodium chloride 0.9 % 100 mL IVPB (has no administration in time range)  lactated ringers infusion (has no administration in time range)  sodium chloride 0.9 % bolus 1,000 mL (1,000 mLs Intravenous New Bag/Given 09/29/22 0145)  propofol (DIPRIVAN) 1000 MG/100ML infusion (5 mcg/kg/min  New Bag/Given 09/29/22 0234)     Procedures  /  Critical Care Procedure Name: Intubation Date/Time: 09/29/2022 2:53 AM  Performed by: BeMaudie FlakesMDPre-anesthesia Checklist: Patient identified, Patient being monitored, Emergency Drugs available, Timeout performed Nicole Suction available Oxygen Delivery Method: Non-rebreather mask Preoxygenation: Pre-oxygenation with 100% oxygen Induction Type: Rapid sequence Ventilation: Mask ventilation without difficulty Laryngoscope Size: Mac Nicole 4 Grade View: Grade I Tube size: 7.5 mm Number [name] attempts: 1 Airway Equipment Nicole Method: Stylet Placement Confirmation: ETT inserted through vocal cords under  direct vision, CO2 detector Nicole [name] sounds checked- equal Nicole bilateral Secured at: 23 cm Tube secured with: ETT holder Comments: RSI with 10 mg etomidate, 80 mg rocuronium.    .Critical Care  Performed by: BeMaudie FlakesMD Authorized by: BeMaudie FlakesMD   Critical care provider statement:    Critical care time (minutes):  80   Critical care was necessary to treat or prevent imminent or life-threatening deterioration [name] the following conditions:  Respiratory failure   Critical care was  time spent personally by me on the following activities:  Development [name] treatment plan with patient or surrogate, discussions with consultants, evaluation [name] patient's response to treatment, examination [name] patient, ordering Nicole review [name] laboratory studies, ordering Nicole review [name] radiographic studies, ordering Nicole performing treatments Nicole interventions, pulse oximetry, re-evaluation [name] patient's condition Nicole review [name] old charts .Central Line  Date/Time: 09/29/2022 2:54 AM  Performed by: Maudie Flakes, MD Authorized by: Maudie Flakes, MD   Consent:    Consent obtained:  Emergent situation Universal protocol:    Patient identity confirmed:  Anonymous protocol, patient vented/unresponsive Pre-procedure details:    Indication(s): central venous access     Hand hygiene: Hand hygiene performed prior to insertion     Skin preparation:  Chlorhexidine   Skin preparation agent: Skin preparation agent completely dried prior to procedure   Sedation:    Sedation type:  Deep Anesthesia:    Anesthesia method:  None Procedure details:    Location:  R femoral   Patient position:  Supine   Procedural supplies:  Triple lumen   Catheter size:  7 Fr   Landmarks identified: yes     Ultrasound guidance: yes     Ultrasound guidance timing: real time     Number [name] attempts:  1   Successful placement: yes   Post-procedure details:    Post-procedure:  Dressing applied Nicole line sutured   Assessment:   Blood return through all ports Nicole free fluid flow   Procedure completion:  Tolerated well, no immediate complications Comments:     CVL placed rapidly using aseptic technique but not fully sterile.  Recommend removal or replacement in 24 to 48 hours.   ED Course Nicole Medical Decision Making  Initial Impression Nicole Ddx Patient presenting with profound hypoxia as well as Nicole [name], hypotension.  Nicole report [name] hematemesis.  Differential diagnosis includes sepsis, hypovolemic shock, cardiopulmonary collapse such as MI, CHF, PE.  Patient is altered, very ill-appearing, saturations only 88% on 15 L nasal cannula.  Decision was made to intubate for airway protection Nicole to help management [name] hypoxic respiratory failure.  Central line placed as described above given the continued need for high-dose pressors.  Has been able to downtrend the norepinephrine drip to 10 mics per minute.  Given the EKG demonstrating a junctional Nicole [name], cardiology was Nicole consulted for recommendations.  Past medical/surgical history that increases complexity [name] ED encounter: Unknown, no documented history, unable to reach daughter by phone  Interpretation [name] Diagnostics I personally reviewed the EKG Nicole my interpretation is as follows: Junctional Nicole [name]    Patient Reassessment Nicole Ultimate Disposition/Management     Patient will be admitted to the intensive care unit for continued care.  She is currently on a propofol drip for sedation, receiving cefepime given lingering concern for sepsis, code sepsis was initiated.  LR infusion as well.   3:15 AM update: I am informed that the care [name] has now produced a picture or photocopy [name] the DNR, which was not transported with the patient.  This is obviously unfortunate as patient is now intubated Nicole sedated.  Still unable to reach daughter.  Personally I am hesitant to terminally extubate without first discussing the case with family.  Patient management  required discussion with the following services or consulting groups:  Intensivist Service Nicole Cardiology  Complexity [name] Problems Addressed Acute illness or injury that poses threat [name] life [name] bodily function  Additional Data Reviewed Nicole Analyzed Further history obtained from: EMS  on arrival  Additional Factors Impacting ED Encounter Risk Consideration [name] hospitalization Nicole Major procedures  Barth Kirks. Sedonia Small, MD [email or web address]  Final Clinical Impressions(s) / ED Diagnoses     ICD-10-CM   1. Nicole [name]  R00.1     2. Hypotension, unspecified hypotension type  I95.9     3. Hematemesis, unspecified whether nausea present  K92.0     4. Acute respiratory failure with hypoxia (Andrew)  J96.01       ED Discharge Orders     None        Discharge Instructions Discussed with Nicole Provided to Patient:   Discharge Instructions   None      Maudie Flakes, MD 09/29/22 0302    Maudie Flakes, MD 09/29/22 [phone]

## 2022-09-29 NOTE — H&P (Signed)
NAME:  Nicole [name], MRN:  [id number], DOB:  September 07, 1935, LOS: 0 ADMISSION DATE:  09/29/2022, CONSULTATION DATE:  09/29/22 REFERRING MD:  Pilar Plate, CHIEF COMPLAINT:  SOB, Bradycardia, emesis   History of Present Illness:  Patient is encephalopathic and/or intubated. Therefore history has been obtained from chart review.   Nicole [name], is a 87 y.o. female, who presented to the [facility] ED via EMS from her independent living facility with a chief complaint of shortness of breath, bradycardia, emesis  She has no known past medical history.  No history available.  Care everywhere.  No history available at this time from facility.  No family available at this time.  Patient presented in September with fall from standing.  On arrival to ED she was found to be hypoxic, bradycardia, hypotensive.  She was intubated.  Code sepsis was called.  Blood cultures were obtained.  Fluid was given.  Cefepime was started.  Documentation was later sent with patient as DNR.  Significant labs WBC 20.8, hemoglobin 9.3, sodium 125, potassium 6.6, lactate 5.3, creatinine 1.92, VBG 7.27/37.03/15/16.3. Temp 92 F. CXR concerning for aspiration. Reports of coffee ground emesis. Scant dark ngt output and emesis on face.  PCCM was consulted for admission.  Pertinent  Medical History  No known past medical history  Significant Hospital Events: Including procedures, antibiotic start and stop dates in addition to other pertinent events   12/11 presented to [facility] ED, PCCM consult, Cefepime,   Interim History / Subjective:  See above  Intubated  Prop  Unable to obtain subjective evaluation due to patient status  Objective   Blood pressure 138/77, pulse (!) 41, temperature (!) 92 F (33.3 C), resp. rate (!) 0, SpO2 94 %.    Vent Mode: PRVC FiO2 (%):  [40 %-100 %] 40 % Set Rate:  [18 bmp] 18 bmp Vt Set:  [400 mL] 400 mL PEEP:  [5 cmH20] 5 cmH20 Plateau Pressure:  [17 cmH20-19 cmH20] 17 cmH20   Intake/Output Summary  (Last 24 hours) at 09/29/2022 0345 Last data filed at 09/29/2022 0302 Gross per 24 hour  Intake --  Output 300 ml  Net -300 ml   There were no vitals filed for this visit.  Examination: General: In bed, elderly, frail HEENT: MM pink/moist, anicteric, atraumatic Neuro: RASS -5, right eye irregular, nonreactive, left eye 3 mm reactive to light, sedated CV: S1S2, junctional rhythm, no m/r/g appreciated PULM:  air movement in all lobes, trachea midline, chest expansion symmetric GI: soft, bsx4 hypoactive, non-tender   Extremities: warm/dry, no pretibial edema, capillary refill less than 3 seconds  Skin:  no rashes or lesions noted  Labs/Imaging WBC 20.8 Platelets 490 Hemoglobin 9.3 Sodium 125 Potassium 6.6 Chloride 93 CO2 17, anion gap 15 Glucose 229 Creatinine 1.92, BUN 25 AST 188, ALT 109, albumin 3.4 Lipase is normal limits Lactate 5.3 Troponin within normal limits Blood cultures pending UA negative for nitrite, leukocytes. Respiratory panel pending Chest x-ray: No pneumothorax, possible small bilateral pleural effusions, suspicious right lower lobe infiltrate. KUB no free air, gastric tube in stomach. Twelve-lead: Junctional rhythm with RBB versus ventricular escape rhythm  Resolved Hospital Problem list     Assessment & Plan:  Shock undifferentiated, suspect septic shock, possibly hemorrhagic component Lactic acidosis, secondary to sepsis WBC 20.8, lactate 5.3, patient hypotensive and hypothermic. chest x-ray suspicious for pneumonia, possibly aspiration event.  UA unremarkable.  -Goal MAP 65 or greater. Levophed ordered. Titrate medication to goal -S/P fluid resusitation. -Obtain/Follow up [facility]  and UC -On Cefepime and Vancomycin. Narrow as cultures result.  -Trend lactate -Follow-up blood cultures, urine culture, tracheal aspirate, MRSA PCR -Obtain ECHO -Obtain CT chest -Geophysicist/field seismologist.  Goal normothermia.  Goal temperature rise 1 degree an  hour.  Acute metabolic encephalopathy, suspect secondary to sepsis and metabolic derangements Anisocoria -obtain CT head -monitor neuro exam -RASS goal 0 to -1  Acute respiratory failure with hypoxia secondary to suspected pneumonia versus aspiration right lower lobe -LTVV strategy with tidal volumes of 4-8 cc/kg ideal body weight -Goal plateau pressures less than 30 and driving pressures less than 15 -Wean PEEP/FiO2 for SpO2 92-98% -VAP bundle -Daily SAT and SBT -PAD bundle with Propofol gtt and fentanyl push -RASS goal 0 to -1 -ABX as above  Bradycardia, suspect secondary to electrolyte abnormalities, hypothermia -Appreciate cardiology assistance. -Avoid AV nodal blocking agents -Follow-up echo -Correct electrolytes as below  Hyperkalemia Hyponatremia K 6.6, sodium 125.  Temporized in ED. -Repeat BMP post fluid resuscitation.  Sodium goal rise 8-41meq -Lokelma now  ?Coffee ground emesis Acute on chronic blood loss anemia Patient presented with coffee-ground emesis.  Hemoglobin 10.63 months ago, now 9.3. -Transfuse PRBC if HBG less than 7 -Obtain AM CBC to trend H&H -Monitor for signs of bleeding -Continue n.p.o.,  -continue NG tube to low intermittent suction -Consider a.m. GI consult status post resuscitation and stabilization -PPI BID  AKI Creatinine 1.92, BUN 25. Secondary to shock -Ensure renal perfusion. Goal MAP 65 or greater. -Avoid neprotoxic drugs as possible. -Strict I&O's -Follow up AM creatinine -continue foley  Transaminitis AST 188, ALT 109, suspect secondary to shock -Trend CMP  DNR Patient initially presenting to ED and intubated. DNR paperwork was sent later  -Continue DNR -Attempt to call family in AM. Continue GOC conversations   Best Practice (right click and "Reselect all SmartList Selections" daily)   Diet/type: NPO DVT prophylaxis: SCD GI prophylaxis: PPI Lines: Central line and yes and it is still needed Foley:  Yes, and it  is still needed Code Status:  DNR Last date of multidisciplinary goals of care discussion [DNR, see above]  Labs   CBC: Recent Labs  Lab 09/29/22 0124 09/29/22 0150 09/29/22 0250  WBC 20.8*  --   --   HGB 9.3* 10.2* 9.2*  HCT 28.1* 30.0* 27.0*  MCV 88.1  --   --   PLT 490*  --   --     Basic Metabolic Panel: Recent Labs  Lab 09/29/22 0124 09/29/22 0150 09/29/22 0250  NA 125* 122* 120*  K 6.6* 6.4* 6.4*  CL 93*  --   --   CO2 17*  --   --   GLUCOSE 229*  --   --   BUN 25*  --   --   CREATININE 1.92*  --   --   CALCIUM 8.6*  --   --    GFR: CrCl cannot be calculated (Unknown ideal weight.). Recent Labs  Lab 09/29/22 0124  WBC 20.8*  LATICACIDVEN 5.3*    Liver Function Tests: Recent Labs  Lab 09/29/22 0124  AST 188*  ALT 109*  ALKPHOS 111  BILITOT 0.6  PROT 6.3*  ALBUMIN 3.4*   Recent Labs  Lab 09/29/22 0124  LIPASE 32   No results for input(s): "AMMONIA" in the last 168 hours.  ABG    Component Value Date/Time   PHART 7.343 (L) 09/29/2022 0250   PCO2ART 25.5 (L) 09/29/2022 0250   PO2ART 340 (H) 09/29/2022 0250   HCO3 14.4 (  L) 09/29/2022 0250   TCO2 15 (L) 09/29/2022 0250   ACIDBASEDEF 11.0 (H) 09/29/2022 0250   O2SAT 100 09/29/2022 0250     Coagulation Profile: Recent Labs  Lab 09/29/22 0124  INR 1.1    Cardiac Enzymes: No results for input(s): "CKTOTAL", "CKMB", "CKMBINDEX", "TROPONINI" in the last 168 hours.  HbA1C: No results found for: "HGBA1C"  CBG: No results for input(s): "GLUCAP" in the last 168 hours.  Review of Systems:   Unable to obtain review of systems due to patient status  Past Medical History:  She,  has no past medical history on file.   Surgical History:  History reviewed. No pertinent surgical history.   Social History:   No social history on file  Family History:  Her family history is not on file.   Allergies Allergies  Allergen Reactions   Codeine    Penicillins      Home Medications   Prior to Admission medications   Medication Sig Start Date End Date Taking? Authorizing Provider  azithromycin (ZITHROMAX Z-PAK) 250 MG tablet Take 500 mg (2 tablets) the first day and 250 mg (1 tablet) each day after. 06/28/22   Franne Forts, DO     Critical care time: 40 minutes    The patient is critically ill with multiple organ systems failure and requires high complexity decision making for assessment and support, frequent evaluation and titration of therapies, application of advanced monitoring technologies and extensive interpretation of multiple databases.    Critical Care Time devoted to patient care services described in this note is 40 minutes. This time reflects time of care of this signee Karle Barr NP. This critical care time does not reflect procedure time but could involve care discussion time with the PCCM attending.  Gershon Mussel., MSN, APRN, AGACNP-BC [facility]  09/29/2022 , 4:17 AM  Please see [email or web address] for pager details  If no response, please call [phone] After hours, please call Elink at [phone]

## 2022-09-29 NOTE — Progress Notes (Addendum)
Rounding Note    Patient Name: Nicole [name] Date of Encounter: 09/29/2022  [facility] Cardiologist: None   Subjective   Pt is intubated and sedated.   Inpatient Medications    Scheduled Meds:  insulin aspart  0-9 Units Subcutaneous Q4H   mouth rinse  15 mL Mouth Rinse Q2H   pantoprazole (PROTONIX) IV  40 mg Intravenous Q12H   vancomycin variable dose per unstable renal function (pharmacist dosing)   Does not apply See admin instructions   Continuous Infusions:  lactated ringers 125 mL/hr at 09/29/22 0600   norepinephrine (LEVOPHED) Adult infusion Stopped (09/29/22 0231)   propofol (DIPRIVAN) infusion     PRN Meds: docusate, etomidate, mouth rinse, polyethylene glycol, rocuronium   Vital Signs    Vitals:   09/29/22 0530 09/29/22 0545 09/29/22 0600 09/29/22 0615  BP: (!) 164/76 (!) 183/91 (!) 167/76 137/72  Pulse: (!) 42 (!) 50 (!) 44 (!) 41  Resp: (!) 0  18 18  Temp: (!) 94 F (34.4 C)     SpO2: 98% 100% 99% 98%  Weight:        Intake/Output Summary (Last 24 hours) at 09/29/2022 0805 Last data filed at 09/29/2022 0600 Gross per 24 hour  Intake 1469.43 ml  Output 400 ml  Net 1069.43 ml      09/29/2022    5:00 AM  Last 3 Weights  Weight (lbs) 111 lb 12.4 oz  Weight (kg) 50.7 kg      Telemetry    Junctional bradycardia - Personally Reviewed  ECG    Junctional bradycardia with PAC, RBBB - Personally Reviewed  Physical Exam   GEN: Intubated, sedated.    Neck: No JVD Cardiac: Huston Foley, regular.  Respiratory: Clear to auscultation bilaterally. GI: Soft MS: No LE edema Neuro:  unable to assess Psych: sedated  Labs    High Sensitivity Troponin:   Recent Labs  Lab 09/29/22 0124 09/29/22 0312  TROPONINIHS 8 8     Chemistry Recent Labs  Lab 09/29/22 0124 09/29/22 0150 09/29/22 0250 09/29/22 0528  NA 125* 122* 120* 125*  K 6.6* 6.4* 6.4* 5.5*  CL 93*  --   --  95*  CO2 17*  --   --  18*  GLUCOSE 229*  --   --  234*  BUN  25*  --   --  26*  CREATININE 1.92*  --   --  1.77*  CALCIUM 8.6*  --   --  8.2*  MG  --   --   --  2.1  PROT 6.3*  --   --   --   ALBUMIN 3.4*  --   --   --   AST 188*  --   --   --   ALT 109*  --   --   --   ALKPHOS 111  --   --   --   BILITOT 0.6  --   --   --   GFRNONAA 25*  --   --  28*  ANIONGAP 15  --   --  12    Lipids No results for input(s): "CHOL", "TRIG", "HDL", "LABVLDL", "LDLCALC", "CHOLHDL" in the last 168 hours.  Hematology Recent Labs  Lab 09/29/22 0124 09/29/22 0150 09/29/22 0250 09/29/22 0528  WBC 20.8*  --   --  18.5*  RBC 3.19*  --   --  2.94*  HGB 9.3* 10.2* 9.2* 8.4*  HCT 28.1* 30.0* 27.0* 25.7*  MCV 88.1  --   --  87.4  MCH 29.2  --   --  28.6  MCHC 33.1  --   --  32.7  RDW 14.3  --   --  14.3  PLT 490*  --   --  357   Thyroid No results for input(s): "TSH", "FREET4" in the last 168 hours.  BNPNo results for input(s): "BNP", "PROBNP" in the last 168 hours.  DDimer No results for input(s): "DDIMER" in the last 168 hours.   Radiology    CT CHEST WO CONTRAST  Result Date: 09/29/2022 CLINICAL DATA:  87 year old female with history of altered mental status. Pneumonia. EXAM: CT CHEST WITHOUT CONTRAST TECHNIQUE: Multidetector CT imaging of the chest was performed following the standard protocol without IV contrast. RADIATION DOSE REDUCTION: This exam was performed according to the departmental dose-optimization program which includes automated exposure control, adjustment of the mA and/or kV according to patient size and/or use of iterative reconstruction technique. COMPARISON:  No priors. FINDINGS: Cardiovascular: Heart size is borderline enlarged. There is no significant pericardial fluid, thickening or pericardial calcification. There is aortic atherosclerosis, as well as atherosclerosis of the great vessels of the mediastinum and the coronary arteries, including calcified atherosclerotic plaque in the left main, left anterior descending, left circumflex  and right coronary arteries. Severe calcifications of the aortic valve. Mediastinum/Nodes: Enlarged AP window lymph node measuring up to 2.4 cm in short axis (axial image 58 of series 3). Oral contrast material in the esophagus. Nasogastric tube extending into the stomach. No axillary lymphadenopathy. Patient is intubated, with the tip of the endotracheal tube 2 cm above the carina. Lungs/Pleura: Scattered areas of bronchial wall thickening, severe thickening of the peribronchovascular interstitium, cylindrical bronchiectasis, extensive mucoid impaction and extensive peribronchovascular micro and macronodularity are noted throughout the lungs bilaterally (left-greater-than-right). Findings are most evident in the left mid to lower lung where the largest of the nodules measure up to 11 x 10 mm (axial image 87 of series 4). Trace bilateral pleural effusions. Calcified pleural plaques in the left hemithorax, presumably sequela of remote left pleural infection or hemorrhage. Upper Abdomen: Aortic atherosclerosis. Musculoskeletal: There are no aggressive appearing lytic or blastic lesions noted in the visualized portions of the skeleton. IMPRESSION: 1. 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 However, the possibility of neoplasm is not entirely excluded. Short-term follow-up noncontrast chest CT is recommended in 3 months, preferably after trial of antimicrobial therapy to ensure the stability or regression of these findings and exclude neoplasm. 2. Enlarged AP window lymph node, potentially reactive in the setting of chronic infection. Attention at time of repeat noncontrast chest CT is recommended to ensure stability or regression. 3. Aortic atherosclerosis, in addition to left main and three-vessel coronary artery disease. 4. There are calcifications of the aortic valve. Echocardiographic correlation for  evaluation of potential valvular dysfunction may be warranted if clinically indicated. Aortic Atherosclerosis (ICD10-I70.0). Electronically Signed   By: Trudie Reed M.D.   On: 09/29/2022 07:26   CT HEAD WO CONTRAST ( )  Result Date: 09/29/2022 CLINICAL DATA:  Altered mental status with unknown cause EXAM: CT HEAD WITHOUT CONTRAST TECHNIQUE: Contiguous axial images were obtained from the base of the skull through the vertex without intravenous contrast. RADIATION DOSE REDUCTION: This exam was performed according to the departmental dose-optimization program which includes automated exposure control, adjustment of the mA and/or kV according to patient size and/or use of iterative reconstruction technique. COMPARISON:  06/27/2022 FINDINGS:  Brain: No evidence of acute infarction, hemorrhage, hydrocephalus, extra-axial collection or mass lesion/mass effect. Patchy low-density in the cerebral white matter attributed to chronic small vessel ischemia. Generalized cerebral volume loss that is mild for age. Vascular: Atheromatous calcification Skull: Normal. Negative for fracture or focal lesion. Sinuses/Orbits: Patchy bilateral sinus opacification, chronic. IMPRESSION: Senescent changes without acute or reversible finding. Electronically Signed   By: Tiburcio Pea M.D.   On: 09/29/2022 07:08   DG Abdomen 1 View  Result Date: 09/29/2022 CLINICAL DATA:  Check gastric catheter placement EXAM: ABDOMEN - 1 VIEW COMPARISON:  None Available. FINDINGS: Gastric catheter is noted within the stomach. Proximal side port lies at the gastroesophageal junction. This could be advanced deeper into the stomach. No free air is seen. No obstructive changes are noted. Right femoral central line is noted standing into the IVC. IMPRESSION: Gastric catheter as described. This could be advanced deeper into the stomach. Electronically Signed   By: Alcide Clever M.D.   On: 09/29/2022 03:18   DG Chest Portable 1 View  Result Date:  09/29/2022 CLINICAL DATA:  Intubation. EXAM: PORTABLE CHEST 1 VIEW COMPARISON:  09/29/2022. FINDINGS: The heart size and mediastinal contours are stable. There is atherosclerotic calcification of the aorta. Patchy opacities are present at the lung bases bilaterally. There is blunting of the costophrenic angles, possible small pleural effusions. No pneumothorax. Apical pleural thickening is unchanged. Degenerative changes are present in the thoracic spine. The distal tip of the endotracheal tube terminates 2.9 cm above the carina. IMPRESSION: 1. The endotracheal tube terminates 2.9 cm above the carina. 2. Remaining findings are unchanged. Electronically Signed   By: Thornell Sartorius M.D.   On: 09/29/2022 02:18   DG Chest Port 1 View  Result Date: 09/29/2022 CLINICAL DATA:  Hematemesis. EXAM: PORTABLE CHEST 1 VIEW COMPARISON:  Remote radiograph 11/24/2005 FINDINGS: Rotated exam. The heart is normal in size. There are patchy ill-defined opacities at both lung bases. Blunting of both costophrenic angles may represent small effusions. Mild biapical pleuroparenchymal scarring. No pulmonary edema. Limited assessment, no acute osseous findings IMPRESSION: 1. Patchy ill-defined opacities at both lung bases, suspicious for pneumonia. Recommend radiographic follow-up to resolution. 2. Possible small bilateral pleural effusions. Electronically Signed   By: Narda Rutherford M.D.   On: 09/29/2022 01:46    Cardiac Studies     Patient Profile     87 y.o. female admitted from Nursing facility with respiratory distress and confusion. She was found to have blood in her mouth and was intubated. Cardiology consulted for bradycardia. Potassium 6.6. Lactic acid 5.3. Full consult note on 09/29/22 at 3am. EKG with junctional rhythm with rates in the 40s to 50s.   Assessment & Plan    Bradycardia in setting of pneumonia, sepsis, acute renal failure, hyperkalemia and lactic acidosis: She is DNR. Continue to follow on telemetry.  No indicated for temporary pacing. Continue to hold any AV nodal blocking agents. Echo today.   Addendum: Echo with normal LV systolic function. Mild AS.   Cardiology will sign off. Please call with questions.   For questions or updates, please contact [facility] Please consult [email or web address] for contact info under      Signed, Verne Carrow, MD , [facility] 09/29/2022, 8:05 AM

## 2022-09-29 NOTE — Progress Notes (Addendum)
Secure chatted bedside RN inquiring if the Pt was on a bed or stretcher that we could get a current weight. Unfortunately they weren't so an approximate weight of 130 lbs will be used for IVF calculations.

## 2022-09-29 NOTE — Progress Notes (Signed)
Pharmacy Antibiotic Note  Nicole [name] is a 87 y.o. female admitted on 09/29/2022 with sepsis.  Pharmacy has been consulted for vancomycin dosing.  Plan: Vancomycin 1250mg  IV x1; monitor SCr +/- vanc level prior to redosing.  Temp (24hrs), Avg:92.1 F (33.4 C), Min:92 F (33.3 C), Max:92.3 F (33.5 C)  Recent Labs  Lab 09/29/22 0124  WBC 20.8*  CREATININE 1.92*  LATICACIDVEN 5.3*      Allergies  Allergen Reactions   Codeine    Penicillins     Thank you for allowing pharmacy to be a part of this patient's care.  14/11/23, PharmD, BCPS  09/29/2022 5:00 AM

## 2022-09-29 NOTE — ED Notes (Signed)
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 My cell is [phone]. This is in regards to Nicole [name], DOB 06/25/1935 who should be arriving shortly via EMS from our Independent Living side of the [employer] [facility]. "

## 2022-09-29 NOTE — Progress Notes (Signed)
Pt being followed by ELink for Sepsis protocol. 

## 2022-09-29 NOTE — Progress Notes (Signed)
Pt transported from 2H20 to CT and back to 2H20 with no complications.

## 2022-09-29 NOTE — Progress Notes (Signed)
NAME:  Nicole [name], MRN:  [id number], DOB:  Nov 28, 1934, LOS: 0 ADMISSION DATE:  09/29/2022, CONSULTATION DATE:  09/29/22 REFERRING MD:  Pilar Plate, CHIEF COMPLAINT:  SOB, Bradycardia, emesis   History of Present Illness:  Patient is encephalopathic and/or intubated. Therefore history has been obtained from chart review.   Nicole [name], is a 87 y.o. female, who presented to the [facility] ED via EMS from her independent living facility with a chief complaint of shortness of breath, bradycardia, emesis  She has no known past medical history.  No history available.  Care everywhere.  No history available at this time from facility.  No family available at this time.  Patient presented in September with fall from standing.  On arrival to ED she was found to be hypoxic, bradycardia, hypotensive.  She was intubated.  Code sepsis was called.  Blood cultures were obtained.  Fluid was given.  Cefepime was started.  Documentation was later sent with patient as DNR.  Significant labs WBC 20.8, hemoglobin 9.3, sodium 125, potassium 6.6, lactate 5.3, creatinine 1.92, VBG 7.27/37.03/15/16.3. Temp 92 F. CXR concerning for aspiration. Reports of coffee ground emesis. Scant dark ngt output and emesis on face.  PCCM was consulted for admission.  Pertinent  Medical History  No known past medical history  Significant Hospital Events: Including procedures, antibiotic start and stop dates in addition to other pertinent events   12/11 presented to [facility] ED, PCCM consult, Cefepime,   Interim History / Subjective:  Remains intubated on PRVC; failed SBT due to tachypnea Off pressors Off sedation; eyes open and moving but not following commands  Objective   Blood pressure (!) 168/131, pulse (!) 55, temperature (!) 96.3 F (35.7 C), temperature source Bladder, resp. rate (!) 22, weight 50.7 kg, SpO2 100 %.    Vent Mode: PRVC FiO2 (%):  [40 %-100 %] 40 % Set Rate:  [18 bmp] 18 bmp Vt Set:  [400 mL] 400 mL PEEP:  [5  cmH20] 5 cmH20 Plateau Pressure:  [15 cmH20-19 cmH20] 15 cmH20   Intake/Output Summary (Last 24 hours) at 09/29/2022 1006 Last data filed at 09/29/2022 0900 Gross per 24 hour  Intake 2020.16 ml  Output 450 ml  Net 1570.16 ml    Filed Weights   09/29/22 0500  Weight: 50.7 kg    Examination: General:  critically ill elderly appearing female on mech vent HEENT: MM pink/moist; ETT in place Neuro: Off sedation; eyes open and moving but not following commands, Left pupil reactive, Right pupil eye injury in past with pupil in superior position of eye CV: s1s2, brady 50s, no m/r/g PULM:  dim clear BS bilaterally; on mech vent PRVC GI: soft, bsx4 active  Extremities: warm/dry, no edema  Skin: no rashes or lesions appreciated  Resolved Hospital Problem list     Assessment & Plan:  Shock undifferentiated, suspect septic shock, possibly hemorrhagic component Lactic acidosis, secondary to sepsis WBC 20.8, lactate 5.3, patient hypotensive and hypothermic. chest x-ray suspicious for pneumonia, possibly aspiration event.  UA unremarkable.  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  Temporized in ED. P: -giving lokelma, calcium, and insulin/dextrose -IV fluids given -trend bmp  ?Coffee ground emesis Acute on chronic blood loss anemia Patient presented with coffee-ground emesis.  Hemoglobin 10.63 months ago, now 9.3. P: -trend cbc -Continue PPI bid -hold on GI consult for now  AKI Creatinine 1.92, BUN 25. Secondary to shock P: -Trend BMP / urinary output -Replace electrolytes as indicated -Avoid nephrotoxic agents, ensure adequate renal perfusion  Transaminitis AST 188, ALT 109, suspect secondary to shock P: -Trend CMP   Best Practice (right click and "Reselect all SmartList Selections" daily)   Diet/type: NPO DVT prophylaxis: SCD GI prophylaxis: PPI Lines: Central line and yes and it is still needed Foley:  Yes, and it is still needed Code Status:  DNR Last date of multidisciplinary goals of care discussion [12/11 see separate IPAL note]  Labs   CBC: Recent Labs  Lab 09/29/22 0124 09/29/22 0150 09/29/22 0250 09/29/22 0528  WBC 20.8*  --   --  18.5*  HGB 9.3* 10.2* 9.2* 8.4*  HCT 28.1* 30.0* 27.0* 25.7*  MCV 88.1  --   --  87.4  PLT 490*  --   --  357     Basic Metabolic Panel: Recent Labs  Lab 09/29/22 0124 09/29/22 0150 09/29/22 0250 09/29/22 0528 09/29/22 0759  NA 125* 122* 120* 125* 125*  K 6.6* 6.4* 6.4* 5.5* 5.8*  CL 93*  --   --  95* 96*  CO2 17*  --   --  18* 19*  GLUCOSE 229*  --   --  234* 139*  BUN 25*  --   --  26* 29*  CREATININE 1.92*  --   --  1.77* 1.72*  CALCIUM 8.6*  --   --  8.2* 8.5*  MG  --   --   --  2.1  --   PHOS  --   --   --  3.9  --     GFR: CrCl cannot be calculated (Unknown ideal weight.). Recent Labs  Lab 09/29/22 0124  09/29/22 0528  WBC 20.8* 18.5*  LATICACIDVEN 5.3* 3.7*     Liver Function Tests: Recent Labs  Lab 09/29/22 0124  AST 188*  ALT 109*  ALKPHOS 111  BILITOT 0.6  PROT 6.3*  ALBUMIN 3.4*    Recent Labs  Lab 09/29/22 0124  LIPASE 32    No results for input(s): "AMMONIA" in the last 168 hours.  ABG    Component Value Date/Time   PHART 7.343 (L) 09/29/2022 0250   PCO2ART 25.5 (L) 09/29/2022 0250   PO2ART 340 (H) 09/29/2022 0250   HCO3 14.4 (L) 09/29/2022 0250   TCO2 15 (L) 09/29/2022 0250   ACIDBASEDEF 11.0 (H) 09/29/2022 0250   O2SAT 100 09/29/2022 0250     Coagulation Profile: Recent Labs  Lab 09/29/22 0124  INR 1.1     Cardiac Enzymes: No results for input(s): "CKTOTAL", "CKMB", "CKMBINDEX", "TROPONINI" in the last 168 hours.  HbA1C: No results  found for: "HGBA1C"  CBG: Recent Labs  Lab 09/29/22 0751  GLUCAP 158*    Review of Systems:   Unable to obtain review of systems due to patient status  Past Medical History:  She,  has no past medical history on file.   Surgical History:  History reviewed. No pertinent surgical history.   Social History:   No social history on file  Family History:  Her family history is not on file.   Allergies Allergies  Allergen Reactions   Codeine    Penicillins      Home Medications  Prior to Admission medications   Medication Sig Start Date End Date Taking? Authorizing Provider  azithromycin (ZITHROMAX Z-PAK) 250 MG tablet Take 500 mg (2 tablets) the first day and 250 mg (1 tablet) each day after. 06/28/22   Franne Forts, DO     Critical care time: 35 minutes    JD Anselm [facility] 09/29/2022, 11:46 AM  Please see [email or web address] for pager details.  From 7A-7P if no response, please call [phone]. After hours, please call ELink [phone].

## 2022-09-29 NOTE — Progress Notes (Signed)
eLink Physician-Brief Progress Note Patient Name: Nicole [name] DOB: [birth year] MRN: [id number]   Date of Service  09/29/2022  HPI/Events of Note  87/F brought in her independent living facility due to shortness of breath, bradycardia and emesis.  In the ED, she was hypotensive, bradycardia and hypoxia.  She was intubated. Documentation of DNR was later sent after she got intubated.   CXR with patchy opacities bilaterally.  7.343/pCO2 25.2/pO2 340  eICU Interventions  Continue empiric antibiotics.  Get CT head.  Follow K. Pt given lokelma.  Insulin for glucose control. Titrate FiO2.  Pt sedated on propofol.  Protonix for GI prophylaxis.       Intervention Category Evaluation Type: New Patient Evaluation  Larinda Buttery 09/29/2022, 6:18 AM

## 2022-09-30 DIAGNOSIS — R6521 Severe sepsis with septic shock: Secondary | ICD-10-CM | POA: Diagnosis not present

## 2022-09-30 DIAGNOSIS — A419 Sepsis, unspecified organism: Secondary | ICD-10-CM | POA: Diagnosis not present

## 2022-09-30 DIAGNOSIS — E871 Hypo-osmolality and hyponatremia: Secondary | ICD-10-CM

## 2022-09-30 LAB — COMPREHENSIVE METABOLIC PANEL
ALT: 126 U/L — ABNORMAL HIGH (ref 0–44)
AST: 97 U/L — ABNORMAL HIGH (ref 15–41)
Albumin: 2.9 g/dL — ABNORMAL LOW (ref 3.5–5.0)
Alkaline Phosphatase: 102 U/L (ref 38–126)
Anion gap: 12 (ref 5–15)
BUN: 23 mg/dL (ref 8–23)
CO2: 18 mmol/L — ABNORMAL LOW (ref 22–32)
Calcium: 8.4 mg/dL — ABNORMAL LOW (ref 8.9–10.3)
Chloride: 103 mmol/L (ref 98–111)
Creatinine, Ser: 1.35 mg/dL — ABNORMAL HIGH (ref 0.44–1.00)
GFR, Estimated: 39 mL/min — ABNORMAL LOW (ref >60)
Glucose, Bld: 84 mg/dL (ref 70–99)
Potassium: 3.4 mmol/L — ABNORMAL LOW (ref 3.5–5.1)
Sodium: 133 mmol/L — ABNORMAL LOW (ref 135–145)
Total Bilirubin: 0.3 mg/dL (ref 0.3–1.2)
Total Protein: 5.3 g/dL — ABNORMAL LOW (ref 6.5–8.1)

## 2022-09-30 LAB — CBC
HCT: 23 % — ABNORMAL LOW (ref 36.0–46.0)
Hemoglobin: 8 g/dL — ABNORMAL LOW (ref 12.0–15.0)
MCH: 29.3 pg (ref 26.0–34.0)
MCHC: 34.8 g/dL (ref 30.0–36.0)
MCV: 84.2 fL (ref 80.0–100.0)
Platelets: 279 10*3/uL (ref 150–400)
RBC: 2.73 MIL/uL — ABNORMAL LOW (ref 3.87–5.11)
RDW: 14.6 % (ref 11.5–15.5)
WBC: 13.7 10*3/uL — ABNORMAL HIGH (ref 4.0–10.5)
nRBC: 0 % (ref 0.0–0.2)

## 2022-09-30 LAB — GLUCOSE, CAPILLARY
Glucose-Capillary: 76 mg/dL (ref 70–99)
Glucose-Capillary: 70 mg/dL (ref 70–99)
Glucose-Capillary: 72 mg/dL (ref 70–99)
Glucose-Capillary: 92 mg/dL (ref 70–99)
Glucose-Capillary: 105 mg/dL — ABNORMAL HIGH (ref 70–99)
Glucose-Capillary: 87 mg/dL (ref 70–99)

## 2022-09-30 LAB — MAGNESIUM: Magnesium: 2 mg/dL (ref 1.7–2.4)

## 2022-09-30 LAB — TRIGLYCERIDES: Triglycerides: 66 mg/dL (ref <150)

## 2022-09-30 MED ORDER — METOPROLOL SUCCINATE ER 50 MG PO TB24: 50.0000 mg | ORAL_TABLET | Freq: Every day | ORAL | Status: DC

## 2022-09-30 MED ORDER — METOPROLOL TARTRATE 25 MG PO TABS
25.0000 mg | ORAL_TABLET | Freq: Two times a day (BID) | ORAL | Status: DC
  Administered 2022-09-30: 25 mg via ORAL
  Filled 2022-09-30: qty 1

## 2022-09-30 MED ORDER — MOMETASONE FURO-FORMOTEROL FUM 200-5 MCG/ACT IN AERO
2.0000 | INHALATION_SPRAY | Freq: Two times a day (BID) | RESPIRATORY_TRACT | Status: DC
  Administered 2022-10-01 – 2022-10-08 (×13): 2 via RESPIRATORY_TRACT
  Filled 2022-09-30 (×2): qty 8.8

## 2022-09-30 MED ORDER — HYDRALAZINE HCL 20 MG/ML IJ SOLN
10.0000 mg | INTRAMUSCULAR | Status: DC | PRN
  Administered 2022-10-01 – 2022-10-07 (×2): 20 mg via INTRAVENOUS
  Filled 2022-09-30 (×2): qty 1

## 2022-09-30 MED ORDER — UMECLIDINIUM BROMIDE 62.5 MCG/ACT IN AEPB
1.0000 | INHALATION_SPRAY | Freq: Every day | RESPIRATORY_TRACT | Status: DC
  Administered 2022-10-01 – 2022-10-08 (×8): 1 via RESPIRATORY_TRACT
  Filled 2022-09-30 (×2): qty 7

## 2022-09-30 MED ORDER — MONTELUKAST SODIUM 10 MG PO TABS
10.0000 mg | ORAL_TABLET | Freq: Every day | ORAL | Status: DC
  Administered 2022-09-30 – 2022-10-08 (×9): 10 mg via ORAL
  Filled 2022-09-30 (×9): qty 1

## 2022-09-30 MED ORDER — LEVOTHYROXINE SODIUM 75 MCG PO TABS: 75.0000 ug | ORAL_TABLET | Freq: Every day | ORAL | Status: DC

## 2022-09-30 MED ORDER — POTASSIUM CHLORIDE 20 MEQ PO PACK
40.0000 meq | PACK | Freq: Once | ORAL | Status: AC
  Administered 2022-09-30: 40 meq
  Filled 2022-09-30: qty 2

## 2022-09-30 MED ORDER — ACETAMINOPHEN 160 MG/5ML PO SOLN
650.0000 mg | Freq: Four times a day (QID) | ORAL | Status: DC | PRN
  Administered 2022-09-30 – 2022-10-01 (×2): 650 mg
  Filled 2022-09-30 (×4): qty 20.3

## 2022-09-30 MED ORDER — LEVOTHYROXINE SODIUM 75 MCG PO TABS
75.0000 ug | ORAL_TABLET | Freq: Every day | ORAL | Status: DC
  Administered 2022-10-01 – 2022-10-08 (×8): 75 ug via ORAL
  Filled 2022-09-30 (×8): qty 1

## 2022-09-30 MED ORDER — TIOTROPIUM BROMIDE MONOHYDRATE 18 MCG IN CAPS
18.0000 ug | ORAL_CAPSULE | Freq: Every day | RESPIRATORY_TRACT | Status: DC
  Filled 2022-09-30: qty 5

## 2022-09-30 MED ORDER — ORAL CARE MOUTH RINSE: 15.0000 mL | OROMUCOSAL | Status: DC | PRN

## 2022-09-30 NOTE — Progress Notes (Signed)
[facility] ADULT ICU REPLACEMENT PROTOCOL   The patient does apply for the [facility] Adult ICU Electrolyte Replacment Protocol based on the criteria listed below:   1.Exclusion criteria: TCTS, ECMO, Dialysis, and Myasthenia Gravis patients 2. Is GFR >/= 30 ml/min? Yes.    Patient's GFR today is 39 3. Is SCr </= 2? Yes.   Patient's SCr is 1.35 mg/dL 4. Did SCr increase >/= 0.5 in 24 hours? No. 5.Pt's weight >40kg  Yes.   6. Abnormal electrolyte(s): K+3.4  7. Electrolytes replaced per protocol 8.  Call MD STAT for K+ </= 2.5, Phos </= 1, or Mag </= 1 Physician:  Dr.Sommer  Lolita Lenz 09/30/2022 6:24 AM

## 2022-09-30 NOTE — Evaluation (Signed)
Physical Therapy Evaluation Patient Details Name: Nicole [name] MRN: [id number] DOB: 09-08-1935 Today's Date: 09/30/2022  History of Present Illness  Pt is an 87 y.o. female admitted from [facility] with SOB, bradycardia, emesis. CXR concerning for aspiration. Workup for acute respiratory failure and metabolic encephalopathy secondary to sepsis, suspected RLL aspiration PNA. ETT 12/11-12/12. PMH includes recent admission for fall 06/2022 sustaining scalp hematoma, otherwise no known PMH.   Clinical Impression  Pt presents with an overall decrease in functional mobility secondary to above. PTA, pt resident [facility], lives alone in apartment, recently 'graduated' from HHPT there, ambulatory with RW. Today, pt requiring frequent min-modA for LOB with transfers and ambulation; motivated to participate despite fatigue. Pt limited by generalized weakness, decreased activity tolerance and impaired balance strategies/postural reactions. Pt would benefit from continued acute PT services to maximize functional mobility and independence prior to d/c with SNF-level therapies if ILF unable to provide necessary level of assist.     SpO2 94-95% on RA HR 86, BP 154/92   Recommendations for follow up therapy are one component of a multi-disciplinary discharge planning process, led by the attending physician.  Recommendations may be updated based on patient status, additional functional criteria and insurance authorization.  Follow Up Recommendations Skilled nursing-short term rehab (<3 hours/day) Can patient physically be transported by private vehicle: Yes    Assistance Recommended at Discharge Frequent or constant Supervision/Assistance  Patient can return home with the following  A little help with walking and/or transfers;A little help with bathing/dressing/bathroom;Assistance with cooking/housework;Assist for transportation;Direct supervision/assist for medications management;Direct supervision/assist for financial  management;Help with stairs or ramp for entrance    Equipment Recommendations  (TBD)  Recommendations for Other Services       Functional Status Assessment Patient has had a recent decline in their functional status and demonstrates the ability to make significant improvements in function in a reasonable and predictable amount of time.     Precautions / Restrictions Precautions Precautions: Fall Restrictions Weight Bearing Restrictions: No      Mobility  Bed Mobility               General bed mobility comments: received sitting in recliner    Transfers Overall transfer level: Needs assistance Equipment used: Rolling walker (2 wheels) Transfers: Sit to/from Stand Sit to Stand: Mod assist           General transfer comment: pt able to stand with minA for trunk elevation, heavy posterior lean requiring modA to prevent LOB ; initiating pregait activity helped pt translate weight anteirorly and establish center of balance    Ambulation/Gait Ambulation/Gait assistance: Min guard, Min assist Gait Distance (Feet): 220 Feet Assistive device: Rolling walker (2 wheels) Gait Pattern/deviations: Step-to pattern, Step-through pattern, Decreased stride length, Shuffle, Trunk flexed Gait velocity: Decreased     General Gait Details: slow, mildly unsteady gait with RW and frequent minA to prevent LOB; pt easily distracted by her own conversation, stopping to talk with hands or removing UE support from RW completely with intermittent LOB; frequent cues for sequencing, safety, attention  Stairs            Wheelchair Mobility    Modified Rankin (Stroke Patients Only)       Balance Overall balance assessment: Needs assistance Sitting-balance support: No upper extremity supported, Feet supported Sitting balance-Leahy Scale: Fair     Standing balance support: Reliant on assistive device for balance, During functional activity Standing balance-Leahy Scale:  Poor Standing balance comment: reliant on UE support; LOB  without UE support                             Pertinent Vitals/Pain Pain Assessment Pain Assessment: Faces Faces Pain Scale: Hurts a little bit Pain Location: R shoulder Pain Descriptors / Indicators: Discomfort Pain Intervention(s): Monitored during session    Home Living Family/patient expects to be discharged to:: Assisted living                 Home Equipment: Agricultural consultant (2 wheels) Additional Comments: Resident at [facility], lives alone in apartment; meals provided by [employer]. Daughter visits near daily    Prior Function Prior Level of Function : Independent/Modified Independent             Mobility Comments: mod indep ambulating with RW. recently finished working with HHPT services at ILF ADLs Comments: reports mod indep standing to shower. meals provided by facility     Hand Dominance        Extremity/Trunk Assessment   Upper Extremity Assessment Upper Extremity Assessment: Generalized weakness    Lower Extremity Assessment Lower Extremity Assessment: Generalized weakness    Cervical / Trunk Assessment Cervical / Trunk Assessment: Kyphotic  Communication   Communication: No difficulties  Cognition Arousal/Alertness: Awake/alert Behavior During Therapy: WFL for tasks assessed/performed Overall Cognitive Status: History of cognitive impairments - at baseline                                 General Comments: did not discuss baseline cognition with family; pt easily distracted and requiring frequent redirection in conversation, tangential with speech at times; suspect pt near baseline        General Comments General comments (skin integrity, edema, etc.): pt's family present and supportive. daughter reports hopeful for pt to receive short term rehab at her facility, or transition to assisted living "for a bit" to allow for increased assist as  needed. SpO2 >/94% on RA    Exercises     Assessment/Plan    PT Assessment Patient needs continued PT services  PT Problem List Decreased strength;Decreased activity tolerance;Decreased balance;Decreased mobility;Decreased cognition;Decreased safety awareness;Cardiopulmonary status limiting activity       PT Treatment Interventions DME instruction;Gait training;Functional mobility training;Therapeutic activities;Therapeutic exercise;Balance training;Patient/family education    PT Goals (Current goals can be found in the Care Plan section)  Acute Rehab PT Goals Patient Stated Goal: return home PT Goal Formulation: With patient Time For Goal Achievement: 10/14/22 Potential to Achieve Goals: Good    Frequency Min 2X/week     Co-evaluation               AM-PAC PT "6 Clicks" Mobility  Outcome Measure Help needed turning from your back to your side while in a flat bed without using bedrails?: A Lot Help needed moving from lying on your back to sitting on the side of a flat bed without using bedrails?: A Lot Help needed moving to and from a bed to a chair (including a wheelchair)?: A Little Help needed standing up from a chair using your arms (e.g., wheelchair or bedside chair)?: A Lot Help needed to walk in hospital room?: A Little Help needed climbing 3-5 steps with a railing? : A Lot 6 Click Score: 14    End of Session Equipment Utilized During Treatment: Gait belt Activity Tolerance: Patient tolerated treatment well Patient left: in chair;with call  bell/phone within reach;with family/visitor present Nurse Communication: Mobility status PT Visit Diagnosis: Other abnormalities of gait and mobility (R26.89);Muscle weakness (generalized) (M62.81)    Time: 6237-6283 PT Time Calculation (min) (ACUTE ONLY): 25 min   Charges:   PT Evaluation $PT Eval Moderate Complexity: 1 Mod PT Treatments $Gait Training: 8-22 mins      Ina Homes, PT, DPT Acute Rehabilitation  Services  Personal: Secure Chat Rehab Office: [phone]  Malachy Chamber 09/30/2022, 5:31 PM
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
## 2022-09-30 NOTE — Progress Notes (Signed)
NAME:  Nicole [name], MRN:  [id number], DOB:  [birth year], LOS: 1 ADMISSION DATE:  09/29/2022, CONSULTATION DATE:  09/29/22 REFERRING MD:  Pilar Plate, CHIEF COMPLAINT:  SOB, Bradycardia, emesis   History of Present Illness:  Patient is encephalopathic and/or intubated. Therefore history has been obtained from chart review.   Nicole [name], is a 87 y.o. female, who presented to the [facility] ED via EMS from her independent living facility with a chief complaint of shortness of breath, bradycardia, emesis  She has no known past medical history.  No history available.  Care everywhere.  No history available at this time from facility.  No family available at this time.  Patient presented in September with fall from standing.  On arrival to ED she was found to be hypoxic, bradycardia, hypotensive.  She was intubated.  Code sepsis was called.  Blood cultures were obtained.  Fluid was given.  Cefepime was started.  Documentation was later sent with patient as DNR.  Significant labs WBC 20.8, hemoglobin 9.3, sodium 125, potassium 6.6, lactate 5.3, creatinine 1.92, VBG 7.27/37.03/15/16.3. Temp 92 F. CXR concerning for aspiration. Reports of coffee ground emesis. Scant dark ngt output and emesis on face.  PCCM was consulted for admission.  Pertinent  Medical History  No known past medical history on file  Significant Hospital Events: Including procedures, antibiotic start and stop dates in addition to other pertinent events   12/11 presented to [facility] ED, PCCM consult, Cefepime, vanc started; switch to rocephin/azithro; weaned off pressors 12/12 extubated  Interim History / Subjective:  Extubated this am Aox3 BP stable  Objective   Blood pressure (!) 141/76, pulse 96, temperature 98.8 F (37.1 C), temperature source Bladder, resp. rate 18, height 5' (1.524 m), weight 51.2 kg, SpO2 100 %.    Vent Mode: CPAP;PSV FiO2 (%):  [40 %] 40 % Set Rate:  [18 bmp] 18 bmp Vt Set:  [400 mL] 400 mL PEEP:  [5 cmH20] 5  cmH20 Pressure Support:  [5 cmH20] 5 cmH20 Plateau Pressure:  [14 cmH20-18 cmH20] 17 cmH20   Intake/Output Summary (Last 24 hours) at 09/30/2022 1009 Last data filed at 09/30/2022 1000 Gross per 24 hour  Intake 807.74 ml  Output 2430 ml  Net -1622.26 ml    Filed Weights   09/29/22 0500 09/30/22 0500  Weight: 50.7 kg 51.2 kg    Examination: General:  elderly appearing female on NAD HEENT: MM pink/moist; [place] in place Neuro: Aox3; MAE; Left pupil reactive, Right pupil 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 Possible history of bronchiectasis: reported by daughter P: -passed SBT; spoke with daughter who is in agreement with one way extubation -proceeded with extubation -wean [place] for sats >92% -pulm toiletry: is/flutter -on spiriva/advair at home; resume home spiriva and start dulera -follow trach culture; change abx to rocephin/azithro -repeat CT chest in 3 months  Bradycardia, suspect secondary  to electrolyte abnormalities, hypothermia P: -cards following -rate in 90s today -telemetry monitoring -hold home metoprolol/lisinopril/hctz  Hyperkalemia Hyponatremia AKI P: -improving -trend bmp -Trend BMP / urinary output -Replace electrolytes as indicated -Avoid  nephrotoxic agents, ensure adequate renal perfusion  ?Coffee ground emesis Acute on chronic blood loss anemia Patient presented with coffee-ground emesis.  Hemoglobin 10.63 months ago, now 9.3. P: -trend cbc -Continue PPI bid  Transaminitis AST 188, ALT 109, suspect secondary to shock P: -LFTs trending down -hold home statin for now  HTN HLD P: -hold home verapimil, lisinopril/hctz, metoprolol -consider resuming metoprolol tomorrow if bp stable -prn hydralazine for HTN  Hypothyroidism P: -resume synthroid   Best Practice (right click and "Reselect all SmartList Selections" daily)   Diet/type: NPO bedside swallow eval DVT prophylaxis: SCD GI prophylaxis: PPI Lines: No longer needed.  Order written to d/c  Foley:  removal ordered  Code Status:  DNR Last date of multidisciplinary goals of care discussion [12/12 called daughter and informed on one way extubation today; agreed with plan]  Labs   CBC: Recent Labs  Lab 09/29/22 0124 09/29/22 0150 09/29/22 0250 09/29/22 0528 09/30/22 0506  WBC 20.8*  --   --  18.5* 13.7*  HGB 9.3* 10.2* 9.2* 8.4* 8.0*  HCT 28.1* 30.0* 27.0* 25.7* 23.0*  MCV 88.1  --   --  87.4 84.2  PLT 490*  --   --  357 279     Basic Metabolic Panel: Recent Labs  Lab 09/29/22 0124 09/29/22 0150 09/29/22 0250 09/29/22 0528 09/29/22 0759 09/29/22 1600 09/30/22 0506  NA 125*   < > 120* 125* 125* 128* 133*  K 6.6*   < > 6.4* 5.5* 5.8* 4.6 3.4*  CL 93*  --   --  95* 96* 98 103  CO2 17*  --   --  18* 19* 18* 18*  GLUCOSE 229*  --   --  234* 139* 28* 84  BUN 25*  --   --  26* 29* 29* 23  CREATININE 1.92*  --   --  1.77* 1.72* 1.80* 1.35*  CALCIUM 8.6*  --   --  8.2* 8.5* 8.7* 8.4*  MG  --   --   --  2.1  --   --  2.0  PHOS  --   --   --  3.9  --   --   --    < > = values in this interval not displayed.    GFR: Estimated Creatinine Clearance: 21.9 mL/min (A) (by C-G formula based on SCr of 1.35 mg/dL (H)). Recent Labs  Lab  09/29/22 0124 09/29/22 0528 09/30/22 0506  WBC 20.8* 18.5* 13.7*  LATICACIDVEN 5.3* 3.7*  --      Liver Function Tests: Recent Labs  Lab 09/29/22 0124 09/30/22 0506  AST 188* 97*  ALT 109* 126*  ALKPHOS 111 102  BILITOT 0.6 0.3  PROT 6.3* 5.3*  ALBUMIN 3.4* 2.9*    Recent Labs  Lab 09/29/22 0124  LIPASE 32    Recent Labs  Lab 09/29/22 1140  AMMONIA 27    ABG    Component Value Date/Time   PHART 7.343 (L) 09/29/2022 0250   PCO2ART 25.5 (L) 09/29/2022 0250   PO2ART 340 (H) 09/29/2022 0250   HCO3 14.4 (L) 09/29/2022 0250   TCO2 15 (L) 09/29/2022 0250   ACIDBASEDEF 11.0 (H) 09/29/2022 0250   O2SAT 100 09/29/2022 0250     Coagulation Profile: Recent Labs  Lab 09/29/22 0124  INR  1.1     Cardiac Enzymes: No results for input(s): "CKTOTAL", "CKMB", "CKMBINDEX", "TROPONINI" in the last 168 hours.  HbA1C: Hgb A1c MFr Bld  Date/Time Value Ref Range Status  09/29/2022 05:28 AM 6.3 (H) 4.8 - 5.6 % Final    Comment:    (NOTE)         Prediabetes: 5.7 - 6.4         Diabetes: >6.4         Glycemic control for adults with diabetes: <7.0     CBG: Recent Labs  Lab 09/29/22 1644 09/29/22 2008 09/29/22 2345 09/30/22 0420 09/30/22 0753  GLUCAP 159* 84 82 76 70     Review of Systems:   Unable to obtain review of systems due to patient status  Past Medical History:  She,  has no past medical history on file.   Surgical History:  History reviewed. No pertinent surgical history.   Social History:   No social history on file  Family History:  Her family history is not on file.   Allergies Allergies  Allergen Reactions   Codeine Nausea Only and Other (See Comments)    Dizziness    Penicillins Other (See Comments)    Unknown     Home Medications  Prior to Admission medications   Medication Sig Start Date End Date Taking? Authorizing Provider  azithromycin (ZITHROMAX Z-PAK) 250 MG tablet Take 500 mg (2 tablets) the first day and 250 mg (1  tablet) each day after. 06/28/22   Franne Forts, DO     Critical care time: 35 minutes    JD Anselm Lis [facility] 09/30/2022, 10:09 AM  Please see [email or web address] for pager details.  From 7A-7P if no response, please call [phone]. After hours, please call ELink [phone].
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
## 2022-10-01 ENCOUNTER — Inpatient Hospital Stay (HOSPITAL_COMMUNITY): Payer: Medicare Other

## 2022-10-01 DIAGNOSIS — K920 Hematemesis: Secondary | ICD-10-CM | POA: Diagnosis not present

## 2022-10-01 DIAGNOSIS — R001 Bradycardia, unspecified: Secondary | ICD-10-CM | POA: Diagnosis not present

## 2022-10-01 DIAGNOSIS — I959 Hypotension, unspecified: Secondary | ICD-10-CM

## 2022-10-01 DIAGNOSIS — E440 Moderate protein-calorie malnutrition: Secondary | ICD-10-CM | POA: Insufficient documentation

## 2022-10-01 DIAGNOSIS — A419 Sepsis, unspecified organism: Secondary | ICD-10-CM | POA: Diagnosis not present

## 2022-10-01 DIAGNOSIS — J9601 Acute respiratory failure with hypoxia: Secondary | ICD-10-CM | POA: Diagnosis not present

## 2022-10-01 DIAGNOSIS — E871 Hypo-osmolality and hyponatremia: Secondary | ICD-10-CM

## 2022-10-01 LAB — CBC
HCT: 24.3 % — ABNORMAL LOW (ref 36.0–46.0)
Hemoglobin: 8.3 g/dL — ABNORMAL LOW (ref 12.0–15.0)
MCH: 29 pg (ref 26.0–34.0)
MCHC: 34.2 g/dL (ref 30.0–36.0)
MCV: 85 fL (ref 80.0–100.0)
Platelets: 299 10*3/uL (ref 150–400)
RBC: 2.86 MIL/uL — ABNORMAL LOW (ref 3.87–5.11)
RDW: 14.6 % (ref 11.5–15.5)
WBC: 11.6 10*3/uL — ABNORMAL HIGH (ref 4.0–10.5)
nRBC: 0 % (ref 0.0–0.2)

## 2022-10-01 LAB — COMPREHENSIVE METABOLIC PANEL
ALT: 104 U/L — ABNORMAL HIGH (ref 0–44)
AST: 61 U/L — ABNORMAL HIGH (ref 15–41)
Albumin: 3.1 g/dL — ABNORMAL LOW (ref 3.5–5.0)
Alkaline Phosphatase: 100 U/L (ref 38–126)
Anion gap: 9 (ref 5–15)
BUN: 13 mg/dL (ref 8–23)
CO2: 22 mmol/L (ref 22–32)
Calcium: 8.7 mg/dL — ABNORMAL LOW (ref 8.9–10.3)
Chloride: 107 mmol/L (ref 98–111)
Creatinine, Ser: 0.93 mg/dL (ref 0.44–1.00)
GFR, Estimated: >60 mL/min (ref >60)
Glucose, Bld: 85 mg/dL (ref 70–99)
Potassium: 3.5 mmol/L (ref 3.5–5.1)
Sodium: 138 mmol/L (ref 135–145)
Total Bilirubin: 0.4 mg/dL (ref 0.3–1.2)
Total Protein: 5.6 g/dL — ABNORMAL LOW (ref 6.5–8.1)

## 2022-10-01 LAB — C-REACTIVE PROTEIN: CRP: 7.1 mg/dL — ABNORMAL HIGH (ref <1.0)

## 2022-10-01 LAB — GLUCOSE, CAPILLARY
Glucose-Capillary: 98 mg/dL (ref 70–99)
Glucose-Capillary: 87 mg/dL (ref 70–99)
Glucose-Capillary: 123 mg/dL — ABNORMAL HIGH (ref 70–99)
Glucose-Capillary: 105 mg/dL — ABNORMAL HIGH (ref 70–99)
Glucose-Capillary: 119 mg/dL — ABNORMAL HIGH (ref 70–99)

## 2022-10-01 LAB — URIC ACID: Uric Acid, Serum: 5.4 mg/dL (ref 2.5–7.1)

## 2022-10-01 LAB — SEDIMENTATION RATE: Sed Rate: 25 mm/hr — ABNORMAL HIGH (ref 0–22)

## 2022-10-01 LAB — MAGNESIUM: Magnesium: 1.9 mg/dL (ref 1.7–2.4)

## 2022-10-01 MED ORDER — POLYETHYLENE GLYCOL 3350 17 G PO PACK: 17.0000 g | PACK | Freq: Every day | ORAL | Status: DC | PRN

## 2022-10-01 MED ORDER — ACETAMINOPHEN 325 MG PO TABS
650.0000 mg | ORAL_TABLET | Freq: Four times a day (QID) | ORAL | Status: DC | PRN
  Administered 2022-10-01 (×2): 650 mg via ORAL
  Filled 2022-10-01 (×2): qty 2

## 2022-10-01 MED ORDER — INSULIN ASPART 100 UNIT/ML IJ SOLN
0.0000 [IU] | Freq: Every day | INTRAMUSCULAR | Status: DC
  Administered 2022-10-03: 2 [IU] via SUBCUTANEOUS

## 2022-10-01 MED ORDER — ENSURE ENLIVE PO LIQD
237.0000 mL | Freq: Two times a day (BID) | ORAL | Status: DC
  Administered 2022-10-01 – 2022-10-08 (×10): 237 mL via ORAL

## 2022-10-01 MED ORDER — ADULT MULTIVITAMIN W/MINERALS CH
1.0000 | ORAL_TABLET | Freq: Every day | ORAL | Status: DC
  Administered 2022-10-01 – 2022-10-04 (×4): 1 via ORAL
  Filled 2022-10-01 (×4): qty 1

## 2022-10-01 MED ORDER — AZITHROMYCIN 250 MG PO TABS
500.0000 mg | ORAL_TABLET | Freq: Every day | ORAL | Status: AC
  Administered 2022-10-01 – 2022-10-06 (×6): 500 mg via ORAL
  Filled 2022-10-01 (×6): qty 2

## 2022-10-01 MED ORDER — DOCUSATE SODIUM 100 MG PO CAPS
100.0000 mg | ORAL_CAPSULE | Freq: Two times a day (BID) | ORAL | Status: DC | PRN
  Filled 2022-10-01: qty 1

## 2022-10-01 MED ORDER — INSULIN ASPART 100 UNIT/ML IJ SOLN
0.0000 [IU] | Freq: Three times a day (TID) | INTRAMUSCULAR | Status: DC
  Administered 2022-10-01: 1 [IU] via SUBCUTANEOUS
  Administered 2022-10-02: 2 [IU] via SUBCUTANEOUS
  Administered 2022-10-02: 1 [IU] via SUBCUTANEOUS
  Administered 2022-10-03: 2 [IU] via SUBCUTANEOUS
  Administered 2022-10-04: 1 [IU] via SUBCUTANEOUS
  Administered 2022-10-04: 3 [IU] via SUBCUTANEOUS
  Administered 2022-10-05: 2 [IU] via SUBCUTANEOUS
  Administered 2022-10-05: 1 [IU] via SUBCUTANEOUS
  Administered 2022-10-06: 2 [IU] via SUBCUTANEOUS
  Administered 2022-10-06: 1 [IU] via SUBCUTANEOUS
  Administered 2022-10-07: 2 [IU] via SUBCUTANEOUS
  Administered 2022-10-07: 1 [IU] via SUBCUTANEOUS

## 2022-10-01 MED ORDER — TRAMADOL HCL 50 MG PO TABS
50.0000 mg | ORAL_TABLET | Freq: Three times a day (TID) | ORAL | Status: DC | PRN
  Administered 2022-10-07: 50 mg via ORAL
  Filled 2022-10-01: qty 1

## 2022-10-01 NOTE — Progress Notes (Signed)
PHARMACIST - PHYSICIAN COMMUNICATION   CONCERNING: IV to Oral Route Change Policy  RECOMMENDATION: This patient is receiving azithromycin by the intravenous route.  Based on criteria approved by the Pharmacy and Therapeutics Committee, the intravenous medication(s) is/are being converted to the equivalent oral dose form(s).   DESCRIPTION: These criteria include: The patient is eating (either orally or via tube) and/or has been taking other orally administered medications for a least 24 hours The patient has no evidence of active gastrointestinal bleeding or impaired GI absorption (gastrectomy, short bowel, patient on TNA or NPO).  If you have questions about this conversion, please contact the Pharmacy Department  []  ( [phone] )  [place] []  ( [phone] )  [facility] [x]  ( [phone] )  [place] []  ( [phone] )  [facility] []  ( [phone] )  [facility]   Maccoy Haubner, PharmD Clinical Pharmacist **Pharmacist phone directory can now be found on [email or web address] ([email or web address]).  Listed under [facility].    

## 2022-10-01 NOTE — Progress Notes (Signed)
[facility]                                                                              Nicole [name], is a 87 y.o. female, DOB - 21-Jul-1935, OJJ:[id number] Admit date - 09/29/2022    Outpatient Primary MD for the patient is Sherrie Sport, MD  LOS - 2  days  Chief Complaint  Patient presents with   Shortness of Breath   Bradycardia   Emesis       Brief summary   Patient is a 87 year old female with no known past medical history on file presented to ED via EMS from her independent living facility with shortness of breath, bradycardia and emesis.  In ED, she was found to be hypoxic, bradycardic and hypotensive.  She was intubated and code sepsis was called.  Blood cultures obtained.  Patient was placed on IV fluids and antibiotics. Documentation was later sent with the patient as DNR. WBCs 28.8, hemoglobin 9.3, sodium 125, potassium 6.6, lactate 5.3, creatinine 1.92. VBG 7.27/37.03/15/16.3. Temp 92 F. CXR concerning for aspiration. Reports of coffee ground emesis. Scant dark ngt output and emesis on face.  Patient was admitted to the ICU by CCM. She was extubated on 12/12, weaned off of pressors, transferred to the floor  [facility] assumed care on 12/13  Assessment & Plan    Principal Problem: Acute metabolic encephalopathy -Possibly due to sepsis and metabolic derangements, currently alert and oriented -CT head negative for any acute intracranial abnormality - may have unknown baseline dementia   Active problems Acute respiratory failure with hypoxia suspected secondary to pneumonia versus aspiration, has underlying history of bronchiectasis -CT chest showed chronic indolent atypical infectious process such as MAI however, the possibility of neoplasm is not entirely excluded, recommended follow-up noncontrast chest CT in 3 months -Patient was extubated on 12/12 -Continue Spiriva, Dulera and patient was placed on Rocephin, Zithromax -Repeat CT chest in 3  months  Bradycardia likely due to hypothermia and electrolyte abnormalities -Patient was noted to be headache with heart rate in low 40s on admission with hypothermia with temp of 92.1 F -Home metoprolol, lisinopril, HCTZ  held, heart rate now improved  Acute kidney injury, lactic acidosis -Presented with sodium of 125, potassium 6.6, creatinine of 1.92  -Baseline creatinine 1.3 on 06/27/2022.  Lisinopril, HCTZ held -Patient was placed on IV fluid hydration -Creatinine improved to 0.9, back to baseline   Hyperkalemia, Hyponatremia -Potassium 6.6, sodium 125 on admission -Lisinopril HCTZ held, sodium improved to 138, potassium 3.5   Acute on chronic blood loss anemia, ?  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   Weight as of this encounter: 48.7  kg.  Code Status: DNR DVT Prophylaxis:  SCDs Start: 09/29/22 0340   Level of Care: Level of care: Telemetry Medical Family Communication: Updated patient  Disposition Plan:      Remains inpatient appropriate: Hopefully DC to independent living facility tomorrow, patient declines SNF   Procedures:  Intubation, extubation  Consultants:   Patient was admitted by CCM  Antimicrobials:   Anti-infectives (From admission, onward)    Start     Dose/Rate Route Frequency Ordered Stop   10/01/22 1215  azithromycin (ZITHROMAX) tablet 500 mg        500 mg Oral Daily 10/01/22 1123     09/29/22 1700  ceFEPIme (MAXIPIME) 2 g in sodium chloride 0.9 % 100 mL IVPB  Status:  Discontinued        2 g 200 mL/hr over 30 Minutes Intravenous Every 12 hours 09/29/22 1142 09/29/22 1148   09/29/22 1700  cefTRIAXone (ROCEPHIN) 2 g in sodium chloride 0.9 % 100 mL IVPB        2 g 200 mL/hr over 30 Minutes Intravenous Every 24 hours 09/29/22 1152     09/29/22 1245  azithromycin (ZITHROMAX) 500 mg in sodium chloride 0.9 % 250 mL IVPB  Status:  Discontinued        500 mg 250 mL/hr over 60 Minutes Intravenous Every 24 hours 09/29/22 1148 10/01/22 1123   09/29/22 0501  vancomycin variable dose per unstable renal function (pharmacist dosing)  Status:  Discontinued         Does not apply See admin instructions 09/29/22 0501 09/29/22 1148   09/29/22 0500  vancomycin (VANCOREADY) IVPB 1250 mg/250 mL        1,250 mg 166.7 mL/hr over 90 Minutes Intravenous  Once 09/29/22 0455 09/29/22 0652   09/29/22 0300  ceFEPIme (MAXIPIME) 2 g in sodium chloride 0.9 % 100 mL IVPB        2 g 200 mL/hr over 30 Minutes Intravenous  Once 09/29/22 0245 09/29/22 0351          Medications  azithromycin  500 mg Oral Daily   Chlorhexidine Gluconate Cloth  6 each Topical Daily   feeding supplement  237 mL Oral BID BM   insulin aspart  0-5 Units Subcutaneous QHS   insulin aspart  0-9 Units Subcutaneous TID WC   levothyroxine  75  mcg Oral Q0600   mometasone-formoterol  2 puff Inhalation BID   montelukast  10 mg Oral Daily   multivitamin with minerals  1 tablet Oral Daily   pantoprazole (PROTONIX) IV  40 mg Intravenous Q12H   umeclidinium bromide  1 puff Inhalation Daily      Subjective:   Nicole [name] was seen and examined today.  Complaining of left hand/wrist pain and right shoulder pain.  No acute swelling noted.  No chest pain, shortness of breath, dizziness, lightheadedness.  Alert and oriented.  Objective:   Vitals:   10/01/22 0205 10/01/22 0435 10/01/22 0901 10/01/22 1232  BP: (!) 170/80 (!) 109/56 138/70 126/61  Pulse: 87 87 79 75  Resp: 20  16 18  Temp: 99.1 F (37.3 C) 98.3 F (36.8 C) 98.5 F (36.9 C) 97.9 F (36.6 C)  TempSrc: Oral Oral Oral Oral  SpO2: 94% 95% 96% 97%  Weight: 48.7 kg     Height: _0  (1.575 m)       Intake/Output Summary (Last 24 hours) at 10/01/2022 1523 Last data filed at 10/01/2022 1000 Gross per 24 hour  Intake 220 ml  Output 200 ml  Net 20 ml     Wt Readings from Last 3 Encounters:  10/01/22 48.7 kg     Exam General: Alert and oriented x 3, NAD Cardiovascular: S1 S2 auscultated,  RRR Respiratory: Clear to auscultation bilaterally, no wheezing Gastrointestinal: Soft, nontender, nondistended, + bowel sounds Ext: no pedal edema bilaterally Neuro: Strength 5/5 upper and lower extremities bilaterally Musculoskeletal: No digital cyanosis, clubbing, ROM within normal limits Skin: No rashes Psych: Normal affect and demeanor, alert and oriented x3     Data Reviewed:  I have personally reviewed following labs    CBC Lab Results  Component Value Date   WBC 11.6 (H) 10/01/2022   RBC 2.86 (L) 10/01/2022   HGB 8.3 (L) 10/01/2022   HCT 24.3 (L) 10/01/2022   MCV 85.0 10/01/2022   MCH 29.0 10/01/2022   PLT 299 10/01/2022   MCHC 34.2 10/01/2022   RDW 14.6 10/01/2022   LYMPHSABS 1.9 06/27/2022   MONOABS 1.0 06/27/2022   EOSABS 0.2 06/27/2022    BASOSABS 0.1 63/14/9702     Last metabolic panel Lab Results  Component Value Date   NA 138 10/01/2022   K 3.5 10/01/2022   CL 107 10/01/2022   CO2 22 10/01/2022   BUN 13 10/01/2022   CREATININE 0.93 10/01/2022   GLUCOSE 85 10/01/2022   GFRNONAA >60 10/01/2022   CALCIUM 8.7 (L) 10/01/2022   PHOS 3.9 09/29/2022   PROT 5.6 (L) 10/01/2022   ALBUMIN 3.1 (L) 10/01/2022   BILITOT 0.4 10/01/2022   ALKPHOS 100 10/01/2022   AST 61 (H) 10/01/2022   ALT 104 (H) 10/01/2022   ANIONGAP 9 10/01/2022    CBG (last 3)  Recent Labs    10/01/22 0436 10/01/22 0859 10/01/22 1238  GLUCAP 98 87 123*      Coagulation Profile: Recent Labs  Lab 09/29/22 0124  INR 1.1     Radiology Studies: I have personally reviewed the imaging studies  DG Hand Complete Left  Result Date: 10/01/2022 CLINICAL DATA:  Left hand pain. EXAM: LEFT HAND - COMPLETE 3+ VIEW COMPARISON:  None Available. FINDINGS: There is diffuse decreased bone mineralization. Mild-to-moderate interphalangeal joint space narrowing and peripheral osteophytosis diffusely throughout the first through fifth digits. Moderate thumb carpometacarpal joint space narrowing, subchondral sclerosis, and peripheral osteophytosis. No acute fracture is seen.  No dislocation. Mild calcification overlying the triangular fibrocartilage complex. IMPRESSION: 1. Moderate thumb carpometacarpal osteoarthritis. 2. Mild-to-moderate interphalangeal osteoarthritis diffusely throughout the first through fifth digits. Electronically Signed   By: Yvonne Kendall M.D.   On: 10/01/2022 14:56   DG Shoulder Right  Result Date: 10/01/2022 CLINICAL DATA:  Right shoulder pain. EXAM: RIGHT SHOULDER - 2+ VIEW COMPARISON:  None Available. FINDINGS: There is diffuse decreased bone mineralization. Moderate to severe inferior glenohumeral joint space narrowing. Moderate inferior humeral head-neck junction degenerative osteophytosis. Mild peripheral glenoid degenerative  osteophytosis. Mild-to-moderate superomedial humeral head cortical flattening/remodeling with diffuse subchondral sclerosis and mild subchondral cystic change. Mild acromioclavicular joint space narrowing and peripheral osteophytosis. No acute fracture or dislocation. The visualized portion of the  right lung is unremarkable. Mild-to-moderate atherosclerotic calcifications within the aortic arch. IMPRESSION: 1. 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 2. Mild acromioclavicular osteoarthritis. Electronically Signed   By: Yvonne Kendall M.D.   On: 10/01/2022 14:55       Buckley Bradly M.D. [facility] 10/01/2022, 3:23 PM  Available via Epic secure chat 7am-7pm After 7 pm, please refer to night coverage provider listed on amion.

## 2022-10-01 NOTE — Progress Notes (Addendum)
Nutrition Follow-up  DOCUMENTATION CODES:   Non-severe (moderate) malnutrition in context of social or environmental circumstances  INTERVENTION:  - Add Ensure Enlive po BID, each supplement provides 350 kcal and 20 grams of protein.   - Add MVI q day.   NUTRITION DIAGNOSIS:   Moderate Malnutrition related to social / environmental circumstances (inadequate oral intake, decreasing appetite) as evidenced by mild fat depletion, mild muscle depletion, moderate muscle depletion.  GOAL:   Provide needs based on ASPEN/SCCM guidelines  MONITOR:   Vent status, Labs, Weight trends, TF tolerance, I & O's  REASON FOR ASSESSMENT:   Ventilator    ASSESSMENT:   87 year old female with no significant PMHx admitted from independent living facility with acute hypoxic respiratory failure due to PNA, acute renal failure, hyperkalemia, hyponatremia, anemia, and report of coffee-ground emesis. After intubation paperwork arrived with a portable DNR (this paperwork wasn't initially present on arrival).  Meds reviewed: sliding scale insulin. Labs reviewed.   The pt was extubated on 12/12. Pt's diet was advanced to soft diet today. Per RN documentation, pt was hitting staff. No intakes documented at this time. RD will add Ensure BID for now. Will continue to monitor PO intakes.   Diet Order:   Diet Order             DIET SOFT Room service appropriate? Yes; Fluid consistency: Thin  Diet effective now                   EDUCATION NEEDS:   No education needs have been identified at this time  Skin:  Skin Assessment: Reviewed RN Assessment  Last BM:  Unknown/PTA  Height:   Ht Readings from Last 1 Encounters:  10/01/22 5\' 2"  (1.575 m)    Weight:   Wt Readings from Last 1 Encounters:  10/01/22 48.7 kg    Ideal Body Weight:  45.5 kg  BMI:  Body mass index is 19.64 kg/m.  Estimated Nutritional Needs:   Kcal:  1500-1700  Protein:  70-85 grams  Fluid:  1.5-1.7  L/day  10/03/22, RD, LDN, CNSC.

## 2022-10-01 NOTE — Evaluation (Signed)
Occupational Therapy Evaluation Patient Details Name: Nicole [name] MRN: [id number] DOB: [birth year] Today's Date: 10/01/2022   History of Present Illness Pt is an 87 y.o. female admitted from ILF with SOB, bradycardia, emesis. CXR concerning for aspiration. Workup for acute respiratory failure and metabolic encephalopathy secondary to sepsis, suspected RLL aspiration PNA. ETT 12/11-12/12. PMH includes recent admission for fall 06/2022 sustaining scalp hematoma, otherwise no known PMH.   Clinical Impression   Pt from ALF, needing assist for ADLs at baseline and uses RW for mobility. At time of OT evaluation, pt needing min-mod A for ADLs, min A for bed mobility, and min A for transfers with RW. Pt currently  presenting with BUE weakness, however functional for basic ADLs. Pt presenting with impairments listed below, will follow acutely. Recommend SNF at d/c.     Recommendations for follow up therapy are one component of a multi-disciplinary discharge planning process, led by the attending physician.  Recommendations may be updated based on patient status, additional functional criteria and insurance authorization.   Follow Up Recommendations  Skilled nursing-short term rehab (<3 hours/day)     Assistance Recommended at Discharge Frequent or constant Supervision/Assistance  Patient can return home with the following A little help with walking and/or transfers;A lot of help with bathing/dressing/bathroom    Functional Status Assessment  Patient has had a recent decline in their functional status and demonstrates the ability to make significant improvements in function in a reasonable and predictable amount of time.  Equipment Recommendations  Other (comment) (defer)    Recommendations for Other Services PT consult     Precautions / Restrictions Precautions Precautions: Fall Restrictions Weight Bearing Restrictions: No      Mobility Bed Mobility Overal bed mobility: Needs  Assistance Bed Mobility: Supine to Sit     Supine to sit: Min assist     General bed mobility comments: min A, cues to scoot forward to EOB    Transfers Overall transfer level: Needs assistance Equipment used: Rolling walker (2 wheels) Transfers: Sit to/from Stand Sit to Stand: Min assist                  Balance Overall balance assessment: Needs assistance Sitting-balance support: No upper extremity supported, Feet supported Sitting balance-Leahy Scale: Fair     Standing balance support: Reliant on assistive device for balance, During functional activity Standing balance-Leahy Scale: Poor Standing balance comment: reliant on UE support; LOB without UE support                           ADL either performed or assessed with clinical judgement   ADL Overall ADL's : Needs assistance/impaired Eating/Feeding: Set up   Grooming: Set up   Upper Body Bathing: Minimal assistance   Lower Body Bathing: Moderate assistance   Upper Body Dressing : Minimal assistance   Lower Body Dressing: Moderate assistance   Toilet Transfer: Minimal assistance;Rolling walker (2 wheels);Ambulation;Regular Toilet   Toileting- Clothing Manipulation and Hygiene: Minimal assistance       Functional mobility during ADLs: Minimal assistance;Rolling walker (2 wheels)       Vision   Additional Comments: will further assess     Perception Perception Perception Tested?: No   Praxis Praxis Praxis tested?: Not tested    Pertinent Vitals/Pain Pain Assessment Pain Assessment: Faces Pain Score: 4  Faces Pain Scale: Hurts little more Pain Location: R shoulder, back and L hand Pain Descriptors / Indicators: Discomfort Pain Intervention(s): Limited  activity within patient's tolerance, Monitored during session, Repositioned     Hand Dominance     Extremity/Trunk Assessment Upper Extremity Assessment Upper Extremity Assessment: LUE deficits/detail;RUE deficits/detail RUE  Deficits / Details: Shoulder AROM 90*, 150* PROM, L hand with mild edema noted, dupuytren's contracture, 3/5 grasp RUE Coordination: decreased fine motor;decreased gross motor LUE Deficits / Details: shoulder flexion WNL, dupuytren's contracture, decreased grasp (2/5)   Lower Extremity Assessment Lower Extremity Assessment: Defer to PT evaluation   Cervical / Trunk Assessment Cervical / Trunk Assessment: Kyphotic   Communication Communication Communication: No difficulties   Cognition Arousal/Alertness: Awake/alert Behavior During Therapy: WFL for tasks assessed/performed Overall Cognitive Status: History of cognitive impairments - at baseline                                 General Comments: tangential and conversational, follows commands with increased time     General Comments  VSS on RA    Exercises     Shoulder Instructions      Home Living Family/patient expects to be discharged to:: Assisted living                             Home Equipment: Agricultural consultant (2 wheels)   Additional Comments: Resident at [facility], lives alone in apartment; meals provided by [employer]. Daughter visits near daily      Prior Functioning/Environment Prior Level of Function : Independent/Modified Independent             Mobility Comments: mod indep ambulating with RW. recently finished working with HHPT services at ILF ADLs Comments: reports mod indep standing to shower. meals provided by facility        OT Problem List: Decreased strength;Decreased range of motion;Decreased activity tolerance;Impaired balance (sitting and/or standing);Decreased safety awareness;Impaired UE functional use      OT Treatment/Interventions: Self-care/ADL training;Therapeutic exercise;Energy conservation;DME and/or AE instruction;Therapeutic activities;Patient/family education;Balance training    OT Goals(Current goals can be found in the care plan section)  Acute Rehab OT Goals Patient Stated Goal: none stated OT Goal Formulation: With patient Time For Goal Achievement: 10/15/22 Potential to Achieve Goals: Good ADL Goals Pt Will Perform Upper Body Dressing: with supervision;sitting Pt Will Perform Lower Body Dressing: with min assist;sit to/from stand;sitting/lateral leans Pt Will Transfer to Toilet: with supervision;ambulating;regular height toilet Pt Will Perform Tub/Shower Transfer: Tub transfer;Shower transfer;with supervision;ambulating;rolling walker  OT Frequency: Min 2X/week    Co-evaluation              AM-PAC OT "6 Clicks" Daily Activity     Outcome Measure Help from another person eating meals?: None Help from another person taking care of personal grooming?: A Little Help from another person toileting, which includes using toliet, bedpan, or urinal?: A Little Help from another person bathing (including washing, rinsing, drying)?: A Lot Help from another person to put on and taking off regular upper body clothing?: A Lot Help from another person to put on and taking off regular lower body clothing?: A Lot 6 Click Score: 16   End of Session Equipment Utilized During Treatment: Gait belt;Rolling walker (2 wheels) Nurse Communication: Mobility status  Activity Tolerance: Patient tolerated treatment well Patient left: in chair;with call bell/phone within reach;with chair alarm set  OT Visit Diagnosis: Unsteadiness on feet (R26.81);Other abnormalities of gait and mobility (R26.89);Muscle weakness (generalized) (M62.81)  Time: 4128-7867 OT Time Calculation (min): 30 min Charges:  OT General Charges $OT Visit: 1 Visit OT Evaluation $OT Eval Moderate Complexity: 1 Mod OT Treatments $Self Care/Home Management : 8-22 mins  Carver Fila, OTD, OTR/L SecureChat Preferred Acute Rehab [phone]   Carver Fila Koonce 10/01/2022, 10:49 AM

## 2022-10-01 NOTE — Progress Notes (Signed)
Mobility Specialist - Progress Note   10/01/22 1154  Mobility  Activity Ambulated with assistance in room  Level of Assistance Minimal assist, patient does 75% or more  Assistive Device Front wheel walker  Distance Ambulated (ft) 20 ft  Activity Response Tolerated well  Mobility Referral Yes  $Mobility charge 1 Mobility   Pt received in chair and agreeable to session. Pt c/o RUE pain throughout. Pt was returned to chair with all needs met and family present.   Franki Monte  Mobility Specialist Please contact via Solicitor or Rehab office at [phone]

## 2022-10-01 NOTE — TOC Progression Note (Signed)
Transition of Care [facility]) - Progression Note    Patient Details  Name: Nicole [name] MRN: [id number] Date of Birth: May 22, 1935  [facility] CM/SW Contact  Graves-Bigelow, Lamar Laundry, RN Phone Number: 10/01/2022, 2:34 PM  Clinical Narrative:  Patient is currently active with [facility] for CSW, PT,OT- patient will need resumption orders for services once stable to transition home. No further needs identified at this time.   Expected Discharge Plan: Home w Home Health Services Barriers to Discharge: Continued Medical Work up  Expected Discharge Plan and Services Expected Discharge Plan: Home w Home Health Services In-house Referral: Clinical Social Work     Living arrangements for the past 2 months: Independent Living Facility [facility])  Readmission Risk Interventions     No data to display

## 2022-10-01 NOTE — TOC Initial Note (Addendum)
Transition of Care [facility]) - Initial/Assessment Note    Patient Details  Name: Nicole [name] MRN: [id number] Date of Birth: 1935-07-27  Transition of Care [facility]) CM/SW Contact:    [name], LCSWA Phone Number: 10/01/2022, 10:52 AM  Clinical Narrative:                  CSW received consult for possible SNF placement at time of discharge. CSW spoke with patient at bedside regarding PT recommendation of SNF placement at time of discharge. Patient reports she comes from [facility]. Patient expressed understanding of PT recommendation and declined SNF placement at time of discharge. Patient interested in returning back home. Patients daughter Nicole [name] at bedside updated on patients dc plan. CSW informed CM. No further questions reported at this time. CSW to continue to follow and assist with discharge planning needs.    Expected Discharge Plan: Home w Home Health Services (Interested in hh services) Barriers to Discharge: Continued Medical Work up   Patient Goals and CMS Choice Patient states their goals for this hospitalization and ongoing recovery are:: to return back to her independant living with [facility] pt/ interested in hh services CMS Medicare.gov Compare Post Acute Care list provided to:: Patient Choice offered to / list presented to : Patient  Expected Discharge Plan and Services Expected Discharge Plan: Home w Home Health Services (Interested in hh services) In-house Referral: Clinical Social Work     Living arrangements for the past 2 months: Independent Living Facility [facility], [facility])                                      Prior Living Arrangements/Services Living arrangements for the past 2 months: Independent Living Facility Administrator, arts) Lives with:: Self Patient language and need for interpreter reviewed:: Yes Do you feel safe going back to the place where you live?: Yes      Need for Family Participation in Patient Care: Yes  (Comment) Care giver support system in place?: Yes (comment)   Criminal Activity/Legal Involvement Pertinent to Current Situation/Hospitalization: No - Comment as needed  Activities of Daily Living      Permission Sought/Granted Permission sought to share information with : Case Manager, Family Supports, Magazine features editor Permission granted to share information with : Yes, Verbal Permission Granted  Share Information with NAME: Nicole [name]     Permission granted to share info w Relationship: daughter  Permission granted to share info w Contact Information: Nicole [name] [phone]  Emotional Assessment Appearance:: Appears stated age Attitude/Demeanor/Rapport: Gracious Affect (typically observed): Calm Orientation: : Oriented to Self, Oriented to Place, Oriented to  Time, Oriented to Situation Alcohol / Substance Use: Not Applicable Psych Involvement: No (comment)  Admission diagnosis:  Bradycardia [R00.1] Acute respiratory failure with hypoxia (HCC) [J96.01] Septic shock (HCC) [A41.9, R65.21] Hypotension, unspecified hypotension type [I95.9] Hematemesis, unspecified whether nausea present [K92.0] Patient Active Problem List   Diagnosis Date Noted   Malnutrition of moderate degree 10/01/2022   Hyponatremia 09/30/2022   Septic shock (HCC) 09/29/2022   Bradycardia 09/29/2022   Acute respiratory failure with hypoxia (HCC) 09/29/2022   Hematemesis 09/29/2022   AKI (acute kidney injury) (HCC) 09/29/2022   Hyperkalemia 09/29/2022   Pneumonia due to infectious organism 09/29/2022   Acute respiratory failure (HCC) 09/29/2022   Advance care planning 09/29/2022   PCP:  Mercy Moore, MD Pharmacy:   [facility] [place], [place] - [address] [place] [address] Phone: [phone] Fax: [phone]     Social Determinants of Health (SDOH) Interventions    Readmission Risk Interventions     No data to display

## 2022-10-02 ENCOUNTER — Inpatient Hospital Stay (HOSPITAL_COMMUNITY): Payer: Medicare Other

## 2022-10-02 LAB — MAGNESIUM: Magnesium: 1.8 mg/dL (ref 1.7–2.4)

## 2022-10-02 LAB — COMPREHENSIVE METABOLIC PANEL
ALT: 72 U/L — ABNORMAL HIGH (ref 0–44)
AST: 33 U/L (ref 15–41)
Albumin: 2.8 g/dL — ABNORMAL LOW (ref 3.5–5.0)
Alkaline Phosphatase: 83 U/L (ref 38–126)
Anion gap: 7 (ref 5–15)
BUN: 11 mg/dL (ref 8–23)
CO2: 24 mmol/L (ref 22–32)
Calcium: 8.6 mg/dL — ABNORMAL LOW (ref 8.9–10.3)
Chloride: 108 mmol/L (ref 98–111)
Creatinine, Ser: 0.79 mg/dL (ref 0.44–1.00)
GFR, Estimated: >60 mL/min (ref >60)
Glucose, Bld: 98 mg/dL (ref 70–99)
Potassium: 3.5 mmol/L (ref 3.5–5.1)
Sodium: 139 mmol/L (ref 135–145)
Total Bilirubin: 0.2 mg/dL — ABNORMAL LOW (ref 0.3–1.2)
Total Protein: 5.4 g/dL — ABNORMAL LOW (ref 6.5–8.1)

## 2022-10-02 LAB — GLUCOSE, CAPILLARY
Glucose-Capillary: 89 mg/dL (ref 70–99)
Glucose-Capillary: 125 mg/dL — ABNORMAL HIGH (ref 70–99)
Glucose-Capillary: 195 mg/dL — ABNORMAL HIGH (ref 70–99)
Glucose-Capillary: 151 mg/dL — ABNORMAL HIGH (ref 70–99)

## 2022-10-02 LAB — CBC
HCT: 22.4 % — ABNORMAL LOW (ref 36.0–46.0)
Hemoglobin: 7.6 g/dL — ABNORMAL LOW (ref 12.0–15.0)
MCH: 29.5 pg (ref 26.0–34.0)
MCHC: 33.9 g/dL (ref 30.0–36.0)
MCV: 86.8 fL (ref 80.0–100.0)
Platelets: 265 10*3/uL (ref 150–400)
RBC: 2.58 MIL/uL — ABNORMAL LOW (ref 3.87–5.11)
RDW: 15 % (ref 11.5–15.5)
WBC: 10.5 10*3/uL (ref 4.0–10.5)
nRBC: 0 % (ref 0.0–0.2)

## 2022-10-02 LAB — BRAIN NATRIURETIC PEPTIDE: B Natriuretic Peptide: 755.3 pg/mL — ABNORMAL HIGH (ref 0.0–100.0)

## 2022-10-02 LAB — HEMOGLOBIN AND HEMATOCRIT, BLOOD
HCT: 25.7 % — ABNORMAL LOW (ref 36.0–46.0)
Hemoglobin: 8.6 g/dL — ABNORMAL LOW (ref 12.0–15.0)

## 2022-10-02 LAB — D-DIMER, QUANTITATIVE: D-Dimer, Quant: 3.18 ug/mL-FEU — ABNORMAL HIGH (ref 0.00–0.50)

## 2022-10-02 MED ORDER — SALINE SPRAY 0.65 % NA SOLN
1.0000 | NASAL | Status: DC | PRN
  Filled 2022-10-02: qty 44

## 2022-10-02 MED ORDER — METHYLPREDNISOLONE SODIUM SUCC 125 MG IJ SOLR
60.0000 mg | Freq: Once | INTRAMUSCULAR | Status: AC
  Administered 2022-10-02: 60 mg via INTRAVENOUS
  Filled 2022-10-02: qty 2

## 2022-10-02 MED ORDER — POTASSIUM CHLORIDE CRYS ER 20 MEQ PO TBCR
20.0000 meq | EXTENDED_RELEASE_TABLET | Freq: Once | ORAL | Status: AC
  Administered 2022-10-02: 20 meq via ORAL
  Filled 2022-10-02: qty 1

## 2022-10-02 MED ORDER — FUROSEMIDE 10 MG/ML IJ SOLN
40.0000 mg | Freq: Every day | INTRAMUSCULAR | Status: AC
  Administered 2022-10-02 – 2022-10-03 (×2): 40 mg via INTRAVENOUS
  Filled 2022-10-02 (×2): qty 4

## 2022-10-02 MED ORDER — METOPROLOL TARTRATE 25 MG PO TABS
25.0000 mg | ORAL_TABLET | Freq: Two times a day (BID) | ORAL | Status: DC
  Administered 2022-10-02 – 2022-10-08 (×13): 25 mg via ORAL
  Filled 2022-10-02 (×13): qty 1

## 2022-10-02 MED ORDER — IOHEXOL 350 MG/ML SOLN
60.0000 mL | Freq: Once | INTRAVENOUS | Status: AC | PRN
  Administered 2022-10-02: 60 mL via INTRAVENOUS

## 2022-10-02 MED ORDER — LEVALBUTEROL HCL 0.63 MG/3ML IN NEBU
0.6300 mg | INHALATION_SOLUTION | Freq: Four times a day (QID) | RESPIRATORY_TRACT | Status: DC
  Administered 2022-10-02 – 2022-10-03 (×5): 0.63 mg via RESPIRATORY_TRACT
  Filled 2022-10-02 (×6): qty 3

## 2022-10-02 NOTE — Progress Notes (Addendum)
CTA chest IMPRESSION: 1. Findings consistent with right lower lobe segmental pulmonary embolus, and associated pulmonary infarct. Minimal clot burden. No evidence of right heart strain. 2. Persistent nodular airspace disease throughout the left lung, compatible with inflammatory or infectious etiology. As per previous recommendation, short interval follow-up recommended to document resolution after appropriate medical management. 3. Opacification of the left mainstem bronchus and segmental upper and lower lobe bronchi, consistent with retained secretions, mucoid impaction, or aspiration. 4. Stable adenopathy at the AP window. 5. Aortic Atherosclerosis (ICD10-I70.0). Coronary artery atherosclerosis.    Patient seen.  Patient is alert and oriented x 3. Explained to patient, CTA chest findings of pulmonary embolism and blood clots.  Explained the need for blood thinners, heparin drip to prevent formation of further blood clots. Discussed need for rectal to check for blood in stool based on concerns for emesis with blood in it.  Patient states she had no emesis with blood, states she drank tea prior to vomiting.  She denied possibility of GI bleed.  She denied rectal.  Patient denied initiation of heparin drip.  Patient revisited with nurse Annabelle Harman at bedside.  Again reiterated patient the blood clot/pulmonary embolism that if untreated could get larger and could possibly lead to death.  Patient again declined treatment, stating she had been going through much, she was too old and she did not want anything done.  Lower extremity Doppler ordered Attempted to contact daughter Selena Batten, phone turned off.  Message not left. Nursing did inform me that she spoke with patient's daughter on the issue last night.

## 2022-10-02 NOTE — Progress Notes (Addendum)
[facility]                                                                              Nicole [name], is a 87 y.o. female, DOB - 10/20/1935, ZJQ:[id number] Admit date - 09/29/2022    Outpatient Primary MD for the patient is Sherrie Sport, MD  LOS - 3  days  Chief Complaint  Patient presents with   Shortness of Breath   Bradycardia   Emesis       Brief summary   Patient is a 87 year old female with no known past medical history on file presented to ED via EMS from her independent living facility with shortness of breath, bradycardia and emesis.  In ED, she was found to be hypoxic, bradycardic and hypotensive.  She was intubated and code sepsis was called.  Blood cultures obtained.  Patient was placed on IV fluids and antibiotics. Documentation was later sent with the patient as DNR. WBCs 28.8, hemoglobin 9.3, sodium 125, potassium 6.6, lactate 5.3, creatinine 1.92. VBG 7.27/37.03/15/16.3. Temp 92 F. CXR concerning for aspiration. Reports of coffee ground emesis. Scant dark ngt output and emesis on face.  Patient was admitted to the ICU by CCM. She was extubated on 12/12, weaned off of pressors, transferred to the floor  [facility] assumed care on 12/13  Assessment & Plan    Principal Problem: Acute metabolic encephalopathy -Possibly due to sepsis and metabolic derangements, may have underlying mild dementia -CT head negative for any acute intracranial abnormality -Resolved, fully alert and oriented x 3, at baseline   Active problems Acute respiratory failure with hypoxia suspected secondary to pneumonia versus aspiration, has underlying history of bronchiectasis -CT chest showed chronic indolent atypical infectious process such as MAI however, the possibility of neoplasm is not entirely excluded, recommended follow-up noncontrast chest CT in 3 months -Patient was extubated on 12/12 -Continue Rocephin, Zithromax -Repeat CT chest in 3 months -Noted to be short of  breath today, on exam wheezing with bibasilar Rales, hypoxic, was placed on 5 L O2 -CXR showed bilateral pleural effusions, BNP 755, f/u D dimer -Placed on Xopenex nebs scheduled, Solu-Medrol 60 mg IV x 1, Lasix 40 mg IV daily x 2 doses -Wean O2 as tolerated.  If no improvement in hypoxia, will obtain CTA chest.    Bradycardia likely due to hypothermia and electrolyte abnormalities -On admission noted to be bradycardiac with heart rate in low 40s, hypothermia with temp of 92.1 F -Home verapamil,  metoprolol, lisinopril, HCTZ  held on admit  - HR now elevated in low 100s, will resume metoprolol.   Acute kidney injury, lactic acidosis -Presented with sodium of 125, potassium 6.6, creatinine of 1.92  -Baseline creatinine 1.3 on 06/27/2022.  Lisinopril, HCTZ held -Patient was placed on IV fluid hydration -Renal function improved, creatinine 0.79   Hyperkalemia, Hyponatremia -Potassium 6.6, sodium 125 on admission -Lisinopril HCTZ held -Sodium improved to 139, potassium 3.5   Acute on chronic blood loss anemia, ?  Coffee-ground emesis -Hemoglobin 10.6 on 06/27/2022 -Continue PPI, hemoglobin 7.6 this a.m., repeated 8.6.  Currently stable    Transaminitis AST 188, ALT 109, suspect secondary to shock -LFTs  trending down, continue to hold statins    Essential hypertension -BP now stable, resuming metoprolol due to tachycardia.   -Continue to hold verapamil, lisinopril, HCTZ.    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   Weight as of this encounter: 48.1 kg.  Code Status: DNR DVT Prophylaxis:  SCDs Start: 09/29/22 0340   Level of Care: Level of care: Telemetry Medical Family Communication: Updated patient's daughter on the phone.  Disposition Plan:      Remains inpatient appropriate:   Procedures:  Intubation, extubation  Consultants:   Patient was admitted by CCM  Antimicrobials:   Anti-infectives (From admission, onward)    Start     Dose/Rate Route Frequency Ordered Stop   10/01/22 1215  azithromycin (ZITHROMAX) tablet 500 mg        500 mg Oral Daily 10/01/22 1123     09/29/22 1700  ceFEPIme (MAXIPIME) 2 g in sodium chloride 0.9 % 100 mL IVPB  Status:  Discontinued        2 g 200 mL/hr over 30 Minutes Intravenous Every 12 hours 09/29/22 1142 09/29/22 1148   09/29/22 1700  cefTRIAXone (ROCEPHIN) 2 g in sodium chloride 0.9 % 100 mL IVPB        2 g 200 mL/hr over 30 Minutes Intravenous Every 24 hours 09/29/22 1152     09/29/22 1245  azithromycin (ZITHROMAX) 500 mg in sodium chloride 0.9 % 250 mL IVPB  Status:  Discontinued        500 mg 250 mL/hr over 60 Minutes Intravenous Every 24 hours 09/29/22 1148 10/01/22 1123   09/29/22 0501  vancomycin variable dose per unstable renal function (pharmacist dosing)  Status:  Discontinued         Does not apply See admin instructions 09/29/22 0501 09/29/22 1148   09/29/22 0500  vancomycin (VANCOREADY) IVPB 1250 mg/250 mL        1,250 mg 166.7 mL/hr over 90 Minutes Intravenous  Once 09/29/22 0455 09/29/22 0652   09/29/22 0300  ceFEPIme (MAXIPIME) 2 g in sodium chloride 0.9 % 100 mL IVPB        2 g 200  mL/hr over 30 Minutes Intravenous  Once 09/29/22 0245 09/29/22 0351          Medications  azithromycin  500 mg Oral Daily   feeding supplement  237 mL Oral BID BM   furosemide  40 mg Intravenous Daily   insulin aspart  0-5 Units Subcutaneous QHS   insulin aspart  0-9 Units Subcutaneous TID WC   levalbuterol  0.63 mg Nebulization Q6H   levothyroxine  75 mcg Oral Q0600   mometasone-formoterol  2 puff Inhalation BID   montelukast  10 mg Oral Daily   multivitamin with minerals  1 tablet Oral Daily   pantoprazole (PROTONIX) IV  40 mg Intravenous Q12H   umeclidinium bromide  1 puff Inhalation  Daily      Subjective:   Nicole [name] was seen and examined today.  Today complaining of shortness of breath, congestion, wheezing.  Hypoxic, was placed on 5 L O2.  No chest pain, fevers or chills.  Alert and oriented x 3.   Objective:   Vitals:   10/02/22 0750 10/02/22 0759 10/02/22 1042 10/02/22 1306  BP:  (!) 166/93    Pulse: (!) 101 97 99 (!) 102  Resp:  20  18  Temp:  97.9 F (36.6 C)    TempSrc:  Oral    SpO2: 91% 91% 90%   Weight:      Height:        Intake/Output Summary (Last 24 hours) at 10/02/2022 1357 Last data filed at 10/02/2022 1100 Gross per 24 hour  Intake 340 ml  Output --  Net 340 ml     Wt Readings from Last 3 Encounters:  10/02/22 48.1 kg   Physical Exam General: Alert and oriented x 3, NAD Cardiovascular: S1 S2 clear, RRR.  Tachycardia Respiratory: Bibasilar Rales with wheezing Gastrointestinal: Soft, nontender, nondistended, NBS Ext: no pedal edema bilaterally Neuro: no new deficits Skin: No rashes Psych: Normal affect    Data Reviewed:  I have personally reviewed following labs    CBC Lab Results  Component Value Date   WBC 10.5 10/02/2022   RBC 2.58 (L) 10/02/2022   HGB 8.6 (L) 10/02/2022   HCT 25.7 (L) 10/02/2022   MCV 86.8 10/02/2022   MCH 29.5 10/02/2022   PLT 265 10/02/2022   MCHC 33.9 10/02/2022   RDW 15.0 10/02/2022    LYMPHSABS 1.9 06/27/2022   MONOABS 1.0 06/27/2022   EOSABS 0.2 06/27/2022   BASOSABS 0.1 54/65/6812     Last metabolic panel Lab Results  Component Value Date   NA 139 10/02/2022   K 3.5 10/02/2022   CL 108 10/02/2022   CO2 24 10/02/2022   BUN 11 10/02/2022   CREATININE 0.79 10/02/2022   GLUCOSE 98 10/02/2022   GFRNONAA >60 10/02/2022   CALCIUM 8.6 (L) 10/02/2022   PHOS 3.9 09/29/2022   PROT 5.4 (L) 10/02/2022   ALBUMIN 2.8 (L) 10/02/2022   BILITOT 0.2 (L) 10/02/2022   ALKPHOS 83 10/02/2022   AST 33 10/02/2022   ALT 72 (H) 10/02/2022   ANIONGAP 7 10/02/2022    CBG (last 3)  Recent Labs    10/01/22 2209 10/02/22 1057 10/02/22 1234  GLUCAP 119* 89 125*      Coagulation Profile: Recent Labs  Lab 09/29/22 0124  INR 1.1     Radiology Studies: I have personally reviewed the imaging studies  DG CHEST PORT 1 VIEW  Result Date: 10/02/2022 CLINICAL DATA:  Shortness of breath. EXAM: PORTABLE CHEST 1 VIEW COMPARISON:  CT chest and chest x-ray dated September 29, 2022. FINDINGS: Interval extubation. Unchanged cardiomegaly. Similar left basilar patchy reticulonodular opacities. Improving interstitial opacity at the right lung base. Unchanged small bilateral pleural effusions. No pneumothorax. No acute osseous abnormality. IMPRESSION: 1. Unchanged left basilar and improving right basilar pneumonia. 2. Unchanged small bilateral pleural effusions. Electronically Signed   By: Titus Dubin M.D.   On: 10/02/2022 10:57   DG Hand Complete Left  Result Date: 10/01/2022 CLINICAL DATA:  Left hand pain. EXAM: LEFT HAND - COMPLETE 3+ VIEW COMPARISON:  None Available. FINDINGS: There is diffuse decreased bone mineralization. Mild-to-moderate interphalangeal joint space narrowing and peripheral osteophytosis diffusely throughout the first through fifth digits. Moderate thumb carpometacarpal joint space narrowing, subchondral sclerosis,  and peripheral osteophytosis. No acute fracture is  seen.  No dislocation. Mild calcification overlying the triangular fibrocartilage complex. IMPRESSION: 1. Moderate thumb carpometacarpal osteoarthritis. 2. Mild-to-moderate interphalangeal osteoarthritis diffusely throughout the first through fifth digits. Electronically Signed   By: Yvonne Kendall M.D.   On: 10/01/2022 14:56   DG Shoulder Right  Result Date: 10/01/2022 CLINICAL DATA:  Right shoulder pain. EXAM: RIGHT SHOULDER - 2+ VIEW COMPARISON:  None Available. FINDINGS: There is diffuse decreased bone mineralization. Moderate to severe inferior glenohumeral joint space narrowing. Moderate inferior humeral head-neck junction degenerative osteophytosis. Mild peripheral glenoid degenerative osteophytosis. Mild-to-moderate superomedial humeral head cortical flattening/remodeling with diffuse subchondral sclerosis and mild subchondral cystic change. Mild acromioclavicular joint space narrowing and peripheral osteophytosis. No acute fracture or dislocation. The visualized portion of the right lung is unremarkable. Mild-to-moderate atherosclerotic calcifications within the aortic arch. IMPRESSION: 1. 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 2. Mild acromioclavicular osteoarthritis. Electronically Signed   By: Yvonne Kendall M.D.   On: 10/01/2022 14:55       Ryaan Vanwagoner M.D. [facility] 10/02/2022, 1:57 PM  Available via Epic secure chat 7am-7pm After 7 pm, please refer to night coverage provider listed on amion.

## 2022-10-02 NOTE — Care Management Important Message (Signed)
Important Message  Patient Details  Name: Nicole [name] MRN: [id number] Date of Birth: 04-Aug-1935   Medicare Important Message Given:  Yes     Aerin, Delany 10/02/2022, 9:57 AM

## 2022-10-02 NOTE — Evaluation (Signed)
Clinical/Bedside Swallow Evaluation Patient Details  Name: Nicole [name] MRN: [id number] Date of Birth: May 21, 1935  Today's Date: 10/02/2022 Time: SLP Start Time (ACUTE ONLY): 1446 SLP Stop Time (ACUTE ONLY): 1502 SLP Time Calculation (min) (ACUTE ONLY): 16 min  Past Medical History: History reviewed. No pertinent past medical history. Past Surgical History: History reviewed. No pertinent surgical history. HPI:  Nicole [name], is a 87 y.o. female, who presented [facility] ED via EMS from her independent living facility with a chief complaint of shortness of breath, bradycardia, emesis     She has no known past medical history.  No history available.  Care everywhere.  No history available at this time from facility.  No family available at this time.  Patient presented in September with fall from standing.     On arrival to ED she was found to be hypoxic, bradycardia, hypotensive.  She was intubated.  Code sepsis was called.  Blood cultures were obtained.  Fluid was given.  Cefepime was started.  Documentation was later sent with patient as DNR.  Significant labs WBC 20.8, hemoglobin 9.3, sodium 125, potassium 6.6, lactate 5.3, creatinine 1.92, VBG 7.27/37.03/15/16.3. Temp 92 F. CXR concerning for aspiration. Reports of coffee ground emesis. Scant dark ngt output and emesis on face. CXR on 10/02/22 indicated Unchanged left basilar and improving right basilar pneumonia.  2. Unchanged small bilateral pleural effusions.  ; BSE generated to assess swallowing function    Assessment / Plan / Recommendation  Clinical Impression  Pt seen for clinical swallowing evaluation with a normal oropharyngeal swallow noted despite recent deconditioning/brief intubation.  Pt denotes "difficulty with pills", but otherwise, no dysphagia reported.  Min hoarse vocal quality during speaking, but no overt s/sx of aspiration noted during BSE.  Xerostomia noted during OME, but good strength/coordination observed.  Discussed  using breathing/swallowing strategies including small bites/sips and slow rate during meals with liquid wash /repetitive swallows prn during consumption.  Recommend continuing soft/thin liquid diet with pills whole with liquids, unless pills are large and then recommend splitting prior to intake. SLP Visit Diagnosis: Dysphagia, unspecified (R13.10)    Aspiration Risk  Mild aspiration risk    Diet Recommendation   Soft/thin liquids  Medication Administration: Whole meds with liquid (or split if too large)    Other  Recommendations Oral Care Recommendations: Oral care BID;Patient independent with oral care    Recommendations for follow up therapy are one component of a multi-disciplinary discharge planning process, led by the attending physician.  Recommendations may be updated based on patient status, additional functional criteria and insurance authorization.  Follow up Recommendations Follow physician's recommendations for discharge plan and follow up therapies      Assistance Recommended at Discharge    Functional Status Assessment Patient has had a recent decline in their functional status and demonstrates the ability to make significant improvements in function in a reasonable and predictable amount of time.  Frequency and Duration  (evaluation only)          Prognosis Prognosis for Safe Diet Advancement: Good      Swallow Study   General Date of Onset: 09/29/22 HPI: Nicole [name], is a 87 y.o. female, who presented to the [facility] ED via EMS from her independent living facility with a chief complaint of shortness of breath, bradycardia, emesis     She has no known past medical history.  No history available.  Care everywhere.  No history available at this time from facility.  No  family available at this time.  Patient presented in September with fall from standing.     On arrival to ED she was found to be hypoxic, bradycardia, hypotensive.  She was intubated.  Code sepsis was called.   Blood cultures were obtained.  Fluid was given.  Cefepime was started.  Documentation was later sent with patient as DNR.  Significant labs WBC 20.8, hemoglobin 9.3, sodium 125, potassium 6.6, lactate 5.3, creatinine 1.92, VBG 7.27/37.03/15/16.3. Temp 92 F. CXR concerning for aspiration. Reports of coffee ground emesis. Scant dark ngt output and emesis on face. CXR on 10/02/22 indicated Unchanged left basilar and improving right basilar pneumonia.  2. Unchanged small bilateral pleural effusions.  ; BSE generated to assess swallowing function Type of Study: Bedside Swallow Evaluation Previous Swallow Assessment: n/a Diet Prior to this Study: Regular;Thin liquids (soft) Temperature Spikes Noted: No Respiratory Status: Nasal cannula (2L) History of Recent Intubation: Yes Length of Intubations (days): 1 days Date extubated: 09/30/22 Behavior/Cognition: Alert;Cooperative;Pleasant mood Oral Cavity Assessment: Dry Oral Care Completed by SLP: No Oral Cavity - Dentition: Adequate natural dentition Vision: Functional for self-feeding Self-Feeding Abilities: Able to feed self Patient Positioning: Upright in bed Baseline Vocal Quality:  (hoarse (min)) Volitional Cough: Strong Volitional Swallow: Able to elicit    Oral/Motor/Sensory Function Overall Oral Motor/Sensory Function: Within functional limits   Ice Chips Ice chips: Not tested   Thin Liquid Thin Liquid: Within functional limits Presentation: Cup;Straw    Nectar Thick Nectar Thick Liquid: Not tested   Honey Thick Honey Thick Liquid: Not tested   Puree Puree: Not tested   Solid     Solid: Within functional limits Presentation: Self Fed      Tressie Stalker, M.S., CCC-SLP 10/02/2022,3:15 PM

## 2022-10-03 ENCOUNTER — Inpatient Hospital Stay (HOSPITAL_COMMUNITY): Payer: Medicare Other

## 2022-10-03 DIAGNOSIS — A419 Sepsis, unspecified organism: Secondary | ICD-10-CM | POA: Diagnosis not present

## 2022-10-03 DIAGNOSIS — K920 Hematemesis: Secondary | ICD-10-CM | POA: Diagnosis not present

## 2022-10-03 DIAGNOSIS — R001 Bradycardia, unspecified: Secondary | ICD-10-CM | POA: Diagnosis not present

## 2022-10-03 DIAGNOSIS — I2699 Other pulmonary embolism without acute cor pulmonale: Secondary | ICD-10-CM

## 2022-10-03 DIAGNOSIS — J9601 Acute respiratory failure with hypoxia: Secondary | ICD-10-CM | POA: Diagnosis not present

## 2022-10-03 LAB — HEPARIN LEVEL (UNFRACTIONATED): Heparin Unfractionated: <0.1 IU/mL — ABNORMAL LOW (ref 0.30–0.70)

## 2022-10-03 LAB — BASIC METABOLIC PANEL
Anion gap: 13 (ref 5–15)
BUN: 15 mg/dL (ref 8–23)
CO2: 26 mmol/L (ref 22–32)
Calcium: 9.3 mg/dL (ref 8.9–10.3)
Chloride: 100 mmol/L (ref 98–111)
Creatinine, Ser: 1.05 mg/dL — ABNORMAL HIGH (ref 0.44–1.00)
GFR, Estimated: 52 mL/min — ABNORMAL LOW (ref >60)
Glucose, Bld: 130 mg/dL — ABNORMAL HIGH (ref 70–99)
Potassium: 4 mmol/L (ref 3.5–5.1)
Sodium: 139 mmol/L (ref 135–145)

## 2022-10-03 LAB — CULTURE, BLOOD (ROUTINE X 2)
Culture: NO GROWTH
Special Requests: ADEQUATE

## 2022-10-03 LAB — CBC
HCT: 28.6 % — ABNORMAL LOW (ref 36.0–46.0)
Hemoglobin: 9.5 g/dL — ABNORMAL LOW (ref 12.0–15.0)
MCH: 29.1 pg (ref 26.0–34.0)
MCHC: 33.2 g/dL (ref 30.0–36.0)
MCV: 87.5 fL (ref 80.0–100.0)
Platelets: 326 10*3/uL (ref 150–400)
RBC: 3.27 MIL/uL — ABNORMAL LOW (ref 3.87–5.11)
RDW: 15.1 % (ref 11.5–15.5)
WBC: 10.3 10*3/uL (ref 4.0–10.5)
nRBC: 0 % (ref 0.0–0.2)

## 2022-10-03 LAB — GLUCOSE, CAPILLARY
Glucose-Capillary: 119 mg/dL — ABNORMAL HIGH (ref 70–99)
Glucose-Capillary: 185 mg/dL — ABNORMAL HIGH (ref 70–99)
Glucose-Capillary: 111 mg/dL — ABNORMAL HIGH (ref 70–99)
Glucose-Capillary: 226 mg/dL — ABNORMAL HIGH (ref 70–99)

## 2022-10-03 LAB — TRIGLYCERIDES: Triglycerides: 61 mg/dL (ref <150)

## 2022-10-03 MED ORDER — LEVALBUTEROL HCL 0.63 MG/3ML IN NEBU
0.6300 mg | INHALATION_SOLUTION | Freq: Two times a day (BID) | RESPIRATORY_TRACT | Status: DC
  Administered 2022-10-03 – 2022-10-05 (×3): 0.63 mg via RESPIRATORY_TRACT
  Filled 2022-10-03 (×3): qty 3

## 2022-10-03 MED ORDER — LEVALBUTEROL HCL 0.63 MG/3ML IN NEBU: 0.6300 mg | INHALATION_SOLUTION | Freq: Four times a day (QID) | RESPIRATORY_TRACT | Status: DC | PRN

## 2022-10-03 MED ORDER — HEPARIN BOLUS VIA INFUSION
2000.0000 [IU] | Freq: Once | INTRAVENOUS | Status: AC
  Administered 2022-10-03: 2000 [IU] via INTRAVENOUS
  Filled 2022-10-03: qty 2000

## 2022-10-03 MED ORDER — PREDNISONE 20 MG PO TABS
40.0000 mg | ORAL_TABLET | Freq: Every day | ORAL | Status: AC
  Administered 2022-10-03 – 2022-10-07 (×5): 40 mg via ORAL
  Filled 2022-10-03 (×5): qty 2

## 2022-10-03 MED ORDER — HEPARIN (PORCINE) 25000 UT/250ML-% IV SOLN
900.0000 [IU]/h | INTRAVENOUS | Status: DC
  Administered 2022-10-03: 650 [IU]/h via INTRAVENOUS
  Filled 2022-10-03: qty 250

## 2022-10-03 NOTE — Progress Notes (Signed)
ANTICOAGULATION CONSULT NOTE - Initial Consult  Pharmacy Consult for Heparin  Indication: pulmonary embolus  Allergies  Allergen Reactions   Codeine Nausea Only and Other (See Comments)    Dizziness    Penicillins Other (See Comments)    Unknown    Patient Measurements: Height: 5\' 2"  (157.5 cm) Weight: 46.6 kg (102 lb 11.2 oz) IBW/kg (Calculated) : 50.1  Vital Signs: Temp: 98 F (36.7 C) (12/15 1646) Temp Source: Oral (12/15 1646) BP: 157/81 (12/15 1646) Pulse Rate: 90 (12/15 1646)  Labs: Recent Labs    10/01/22 0216 10/02/22 0211 10/02/22 1007 10/03/22 0540 10/03/22 1631  HGB 8.3* 7.6* 8.6* 9.5*  --   HCT 24.3* 22.4* 25.7* 28.6*  --   PLT 299 265  --  326  --   HEPARINUNFRC  --   --   --   --  <0.10*  CREATININE 0.93 0.79  --  1.05*  --      Estimated Creatinine Clearance: 28.8 mL/min (A) (by C-G formula based on SCr of 1.05 mg/dL (H)).   Medical History: History reviewed. No pertinent past medical history.  Assessment: 87 y/o F with new onset PE, starting heparin, Hgb 8.6, renal function ok.   Heparin level came back undetectable tonight. No issue per Rn. We will rebolus and increase rate. Check level in AM.   Goal of Therapy:  Heparin level 0.3-0.7 units/ml Monitor platelets by anticoagulation protocol: Yes   Plan:  Heparin 2000 units BOLUS Increase heparin drip to 800 units/hr Daily CBC/Heparin level Monitor for bleeding  83, PharmD, BCIDP, AAHIVP, CPP Infectious Disease Pharmacist 10/03/2022 6:06 PM

## 2022-10-03 NOTE — Progress Notes (Signed)
Lower extremity venous has been completed.   Preliminary results in CV Proc.   Aundra Millet Echo [place] 10/03/2022 2:23 PM

## 2022-10-03 NOTE — TOC Progression Note (Signed)
Transition of Care [facility]) - Progression Note    Patient Details  Name: Nicole [name] MRN: [id number] Date of Birth: [birth year]  [facility]) CM/SW Contact  Bess Kinds, RN Phone Number: [phone] 10/03/2022, 4:38 PM  Clinical Narrative:     Benefits check for Eliquis vs Xarelto on [employer] Search web search (online) - both prescriptions have $38/month retail cost vx $34 for 37-month supply by mail  Attempted to call [employer] at [phone]. Unable to verify address or phone number with agent.   Expected Discharge Plan: Home w Home Health Services Barriers to Discharge: Continued Medical Work up  Expected Discharge Plan and Services Expected Discharge Plan: Home w Home Health Services In-house Referral: Clinical Social Work     Living arrangements for the past 2 months: Independent Living Facility Administrator, arts)                                       Social Determinants of Health (SDOH) Interventions    Readmission Risk Interventions     No data to display

## 2022-10-03 NOTE — Progress Notes (Signed)
ANTICOAGULATION CONSULT NOTE - Initial Consult  Pharmacy Consult for Heparin  Indication: pulmonary embolus  Allergies  Allergen Reactions   Codeine Nausea Only and Other (See Comments)    Dizziness    Penicillins Other (See Comments)    Unknown    Patient Measurements: Height: 5\' 2"  (157.5 cm) Weight: 46.6 kg (102 lb 11.2 oz) IBW/kg (Calculated) : 50.1  Vital Signs: Temp: 97.9 F (36.6 C) (12/15 0416) Temp Source: Oral (12/15 0416) BP: 167/93 (12/15 0416) Pulse Rate: 101 (12/15 0416)  Labs: Recent Labs    10/01/22 0216 10/02/22 0211 10/02/22 1007  HGB 8.3* 7.6* 8.6*  HCT 24.3* 22.4* 25.7*  PLT 299 265  --   CREATININE 0.93 0.79  --     Estimated Creatinine Clearance: 37.8 mL/min (by C-G formula based on SCr of 0.79 mg/dL).   Medical History: History reviewed. No pertinent past medical history.  Assessment: 87 y/o F with new onset PE, starting heparin, Hgb 8.6, renal function ok.   Goal of Therapy:  Heparin level 0.3-0.7 units/ml Monitor platelets by anticoagulation protocol: Yes   Plan:  Heparin 2000 units BOLUS Start heparin drip at 650 units/hr 1500 Heparin level Daily CBC/Heparin level Monitor for bleeding  83, PharmD, BCPS Clinical Pharmacist Phone: [phone]

## 2022-10-03 NOTE — Progress Notes (Signed)
Physical Therapy Treatment Patient Details Name: Nicole [name] MRN: [id number] DOB: August 21, 1935 Today's Date: 10/03/2022   History of Present Illness Pt is an 87 y.o. female admitted from ILF with SOB, bradycardia, emesis. CXR concerning for aspiration. Workup for acute respiratory failure and metabolic encephalopathy secondary to sepsis, suspected RLL aspiration PNA. ETT 12/11-12/12. PMH includes recent admission for fall 06/2022 sustaining scalp hematoma, otherwise no known PMH.    PT Comments    Pt presents semi-reclined in bed w/ Heparin IV infusing.  Per discussion w/ nursing, bedside rx only 2/2 Dopplers ordered.  Pt transferred sup to sit w/ supervision and then scoots to EOB w/ cues.  Pt sat EOB, discussing POC for return to PLOF in ILF.  During EOB sitting, O2 sats fluctuated between 90-100% w/o any signs of SOB, pt talking and gesturing w/ hands resulting in inconsistency of O2 reading.  Pt took meds form nurse during session.  Pt returned to semi-reclined in bed w/ supervision, bed alarm on and all needs in reach.    Recommendations for follow up therapy are one component of a multi-disciplinary discharge planning process, led by the attending physician.  Recommendations may be updated based on patient status, additional functional criteria and insurance authorization.  Follow Up Recommendations  Skilled nursing-short term rehab (<3 hours/day)     Assistance Recommended at Discharge Frequent or constant Supervision/Assistance  Patient can return home with the following A little help with walking and/or transfers;A little help with bathing/dressing/bathroom;Assistance with cooking/housework;Assist for transportation;Direct supervision/assist for medications management;Direct supervision/assist for financial management;Help with stairs or ramp for entrance   Equipment Recommendations       Recommendations for Other Services       Precautions / Restrictions  Precautions Precautions: Fall Restrictions Weight Bearing Restrictions: No     Mobility  Bed Mobility   Bed Mobility: Supine to Sit     Supine to sit: Supervision     General bed mobility comments: cues to transition forward to EOB w/ feet touching ground.    Transfers                   General transfer comment: Per nursing, bedside only, still awaiting dopplers LE s and receiving Heparin IV.       Wheelchair Mobility    Modified Rankin (Stroke Patients Only)       Balance   Sitting-balance support: No upper extremity supported, Feet supported Sitting balance-Leahy Scale: Fair                                      Cognition Arousal/Alertness: Awake/alert Behavior During Therapy: WFL for tasks assessed/performed                                   General Comments: verbose        Exercises      General Comments        Pertinent Vitals/Pain Pain Assessment Pain Assessment: No/denies pain Pain Score: 0-No pain    Home Living Family/patient expects to be discharged to::  (per pt, for short time before transitioning back to ILF.)                        Prior Function            PT Goals (current  goals can now be found in the care plan section) Acute Rehab PT Goals Patient Stated Goal: return to ILF after short saty in ALF for therapy. PT Goal Formulation: With patient Time For Goal Achievement: 10/14/22 Potential to Achieve Goals: Good Progress towards PT goals: Progressing toward goals    Frequency    Min 2X/week      PT Plan Current plan remains appropriate    Co-evaluation              AM-PAC PT "6 Clicks" Mobility   Outcome Measure  Help needed turning from your back to your side while in a flat bed without using bedrails?: A Little Help needed moving from lying on your back to sitting on the side of a flat bed without using bedrails?: None Help needed moving to and from a  bed to a chair (including a wheelchair)?: A Little Help needed standing up from a chair using your arms (e.g., wheelchair or bedside chair)?: A Lot Help needed to walk in hospital room?: A Little Help needed climbing 3-5 steps with a railing? : A Lot 6 Click Score: 17    End of Session   Activity Tolerance: Patient tolerated treatment well Patient left: in bed;with bed alarm set;with call bell/phone within reach Nurse Communication: Mobility status (Nursing present in room.) PT Visit Diagnosis: Other abnormalities of gait and mobility (R26.89);Muscle weakness (generalized) (M62.81)     Time: 1610-9604 PT Time Calculation (min) (ACUTE ONLY): 16 min  Charges:  $Therapeutic Activity: 8-22 mins                     Lucio Edward, PT    Lucio Edward 10/03/2022, 2:03 PM

## 2022-10-03 NOTE — Progress Notes (Signed)
[facility]                                                                              Vikkie Goeden, is a 87 y.o. female, DOB - Apr 13, 1935, ZOX:[id number] Admit date - 09/29/2022    Outpatient Primary MD for the patient is Sherrie Sport, MD  LOS - 4  days  Chief Complaint  Patient presents with   Shortness of Breath   Bradycardia   Emesis       Brief summary   Patient is a 87 year old female with no known past medical history on file presented to ED via EMS from her independent living facility with shortness of breath, bradycardia and emesis.  In ED, she was found to be hypoxic, bradycardic and hypotensive.  She was intubated and code sepsis was called.  Blood cultures obtained.  Patient was placed on IV fluids and antibiotics. Documentation was later sent with the patient as DNR. WBCs 28.8, hemoglobin 9.3, sodium 125, potassium 6.6, lactate 5.3, creatinine 1.92. VBG 7.27/37.03/15/16.3. Temp 92 F. CXR concerning for aspiration. Reports of coffee ground emesis. Scant dark ngt output and emesis on face.  Patient was admitted to the ICU by CCM. She was extubated on 12/12, weaned off of pressors, transferred to the floor  [facility] assumed care on 12/13  Assessment & Plan    Principal Problem: Acute metabolic encephalopathy -Possibly due to sepsis and metabolic derangements, may have underlying mild dementia -CT head negative for any acute intracranial abnormality -   Active problems Acute respiratory failure with hypoxia suspected secondary to pneumonia versus aspiration, has underlying history of bronchiectasis -CT chest showed chronic indolent atypical infectious process such as MAI however, the possibility of neoplasm is not entirely excluded, recommended follow-up noncontrast chest CT in 3 months -Patient was extubated on 12/12 -On 10/14, noted to be more short of breath,  escalated O2 requirements to 5 L via [facility], bibasilar Rales - cont lasix, xopenex, received  IV Solu-Medrol yesterday, will transition to prednisone for 5 days  Acute on chronic diastolic CHF  -CXR with bilateral pleural effusions, BNP 755.3, D-dimer 3.18. -Continue Lasix 40 mg IV daily x 2 doses, reassess in a.m., may need low-dose Lasix -2D echo 12/11 had shown EF of 55 to 60% with grade 3 DD   Acute pulmonary embolism -CTA chest showed right lobe segmental pulmonary embolus with associated pulmonary infarct, minimal clot burden.  No right heart strain -Discussed with patient in detail, started on IV heparin drip - TOC consult for DOAC -2D echo 12/11 had shown EF of 55 to 60% with grade 3 DD, right ventricular systolic function normal, mild to moderate TR, moderate AS. -Venous Dopplers bilateral was negative for DVT  Bradycardia likely due to hypothermia and electrolyte abnormalities -On admission noted to be bradycardiac with heart rate in low 40s, hypothermia with temp of 92.1 F -Continue to hold lisinopril, HCTZ, verapamil  -Metoprolol resumed  Acute kidney injury, lactic acidosis -Presented with sodium of 125, potassium 6.6, creatinine of 1.92  -Baseline creatinine 1.3 on 06/27/2022.  Lisinopril, HCTZ held, received IV fluid hydration. -Renal function improved   Hyperkalemia, Hyponatremia -Potassium 6.6,  sodium 125 on admission -Lisinopril HCTZ held -Resolved   Acute on chronic blood loss anemia, ?  Coffee-ground emesis -Hemoglobin 10.6 on 06/27/2022 -Continue PPI -H&H stable    Transaminitis AST 188, ALT 109, suspect secondary to shock -LFTs trending down, continue to hold statins    Essential hypertension -BP now stable, resuming metoprolol due to tachycardia.   -Continue to hold verapamil, lisinopril, HCTZ.    Left hand pain, right shoulder pain -Hand x-ray showed moderate thumb carpometacarpal osteoarthritis, mild to moderate interphalangeal osteoarthritis diffusely throughout the first through fifth digits -Right shoulder x-ray showed moderate to  severe glenohumeral osteoarthritis, mild acromioclavicular osteoarthritis -CRP, ESR, uric acid within normal limits -Tramadol for moderate to severe pain as needed  Hyperlipidemia -Hold statin due to transaminitis  Hypothyroidism -TSH 3.4, continue Synthroid  Moderate protein calorie malnutrition Etiology: social / environmental circumstances (inadequate oral intake, decreasing appetite) Signs/Symptoms: mild fat depletion, mild muscle depletion, moderate muscle depletion Estimated body mass index is 18.78 kg/m as calculated from the following:   Height as of this encounter: _0  (1.575 m).   Weight as of this encounter: 46.6 kg.  Code Status: DNR DVT Prophylaxis:  SCDs Start: 09/29/22 0340   Level of Care: Level of care: Telemetry Medical Family Communication: Updated patient's daughter, [name] on the phone.  Disposition Plan:      Remains inpatient appropriate:   Procedures:  Intubation, extubation 2D echo  Consultants:   Patient was admitted by CCM  Antimicrobials:   Anti-infectives (From admission, onward)    Start     Dose/Rate Route Frequency Ordered Stop   10/01/22 1215  azithromycin (ZITHROMAX) tablet 500 mg        500 mg Oral Daily 10/01/22 1123     09/29/22 1700  ceFEPIme (MAXIPIME) 2 g in sodium chloride 0.9 % 100 mL IVPB  Status:  Discontinued        2 g 200 mL/hr over 30 Minutes Intravenous Every 12 hours 09/29/22 1142 09/29/22 1148   09/29/22 1700  cefTRIAXone (ROCEPHIN) 2 g in sodium chloride 0.9 % 100 mL IVPB        2 g 200 mL/hr over 30 Minutes Intravenous Every 24 hours 09/29/22 1152     09/29/22 1245  azithromycin (ZITHROMAX) 500 mg in sodium chloride 0.9 % 250 mL IVPB  Status:  Discontinued        500 mg 250 mL/hr over 60 Minutes Intravenous Every 24 hours 09/29/22 1148 10/01/22 1123   09/29/22 0501  vancomycin variable dose per unstable renal function (pharmacist dosing)  Status:  Discontinued         Does not apply See admin instructions  09/29/22 0501 09/29/22 1148   09/29/22 0500  vancomycin (VANCOREADY) IVPB 1250 mg/250 mL        1,250 mg 166.7 mL/hr over 90 Minutes Intravenous  Once 09/29/22 0455 09/29/22 0652   09/29/22 0300  ceFEPIme (MAXIPIME) 2 g in sodium chloride 0.9 % 100 mL IVPB        2 g 200 mL/hr over 30 Minutes Intravenous  Once 09/29/22 0245 09/29/22 0351          Medications  azithromycin  500 mg Oral Daily   feeding supplement  237 mL Oral BID BM   insulin aspart  0-5 Units Subcutaneous QHS   insulin aspart  0-9 Units Subcutaneous TID WC   levalbuterol  0.63 mg Nebulization BID   levothyroxine  75 mcg Oral Q0600   metoprolol tartrate  25 mg  Oral BID   mometasone-formoterol  2 puff Inhalation BID   montelukast  10 mg Oral Daily   multivitamin with minerals  1 tablet Oral Daily   pantoprazole (PROTONIX) IV  40 mg Intravenous Q12H   umeclidinium bromide  1 puff Inhalation Daily      Subjective:   Joice Nazario was seen and examined today.  Feeling better today, O2 weaned down to 2 L.  Still somewhat congested, no chest pain, fevers or chills, alert and oriented x 3.,  No nausea or vomiting.    Objective:   Vitals:   10/03/22 1302 10/03/22 1342 10/03/22 1357 10/03/22 1415  BP: 128/68   133/71  Pulse: (!) 58   79  Resp: 16   16  Temp: 97.8 F (36.6 C)   97.8 F (36.6 C)  TempSrc: Oral   Oral  SpO2: 93% 93% 90% 93%  Weight:      Height:        Intake/Output Summary (Last 24 hours) at 10/03/2022 1458 Last data filed at 10/03/2022 1329 Gross per 24 hour  Intake 478 ml  Output 500 ml  Net -22 ml     Wt Readings from Last 3 Encounters:  10/03/22 46.6 kg   Physical Exam General: Alert and oriented x 3, NAD Cardiovascular: S1 S2 clear, RRR.  Respiratory: Bibasilar Rales with wheezing, somewhat improving today Gastrointestinal: Soft, nontender, nondistended, NBS Ext: no pedal edema bilaterally Neuro: no new deficits Psych: Normal affect   Data Reviewed:  I have personally  reviewed following labs    CBC Lab Results  Component Value Date   WBC 10.3 10/03/2022   RBC 3.27 (L) 10/03/2022   HGB 9.5 (L) 10/03/2022   HCT 28.6 (L) 10/03/2022   MCV 87.5 10/03/2022   MCH 29.1 10/03/2022   PLT 326 10/03/2022   MCHC 33.2 10/03/2022   RDW 15.1 10/03/2022   LYMPHSABS 1.9 06/27/2022   MONOABS 1.0 06/27/2022   EOSABS 0.2 06/27/2022   BASOSABS 0.1 29/79/8921     Last metabolic panel Lab Results  Component Value Date   NA 139 10/03/2022   K 4.0 10/03/2022   CL 100 10/03/2022   CO2 26 10/03/2022   BUN 15 10/03/2022   CREATININE 1.05 (H) 10/03/2022   GLUCOSE 130 (H) 10/03/2022   GFRNONAA 52 (L) 10/03/2022   CALCIUM 9.3 10/03/2022   PHOS 3.9 09/29/2022   PROT 5.4 (L) 10/02/2022   ALBUMIN 2.8 (L) 10/02/2022   BILITOT 0.2 (L) 10/02/2022   ALKPHOS 83 10/02/2022   AST 33 10/02/2022   ALT 72 (H) 10/02/2022   ANIONGAP 13 10/03/2022    CBG (last 3)  Recent Labs    10/02/22 2129 10/03/22 0734 10/03/22 1256  GLUCAP 151* 119* 185*      Coagulation Profile: Recent Labs  Lab 09/29/22 0124  INR 1.1     Radiology Studies: I have personally reviewed the imaging studies  VAS [place] LOWER EXTREMITY VENOUS (DVT)  Result Date: 10/03/2022  Lower Venous DVT Study Patient Name:  RUT BETTERTON  Date of Exam:   10/03/2022 Medical Rec #: [id number]      Accession #:    [id number] Date of Birth: 18-Sep-1935      Patient Gender: F Patient Age:   12 years Exam Location:  [facility] Procedure:      VAS [place] LOWER EXTREMITY VENOUS (DVT) Referring Phys: DEBBY CROSLEY --------------------------------------------------------------------------------  Indications: Pulmonary embolism.  Comparison Study: no prior Performing Technologist: Archie Patten RVS  Examination Guidelines: A complete evaluation includes B-mode imaging, spectral Doppler, color Doppler, and power Doppler as needed of all accessible portions of each vessel. Bilateral testing is considered an integral  part of a complete examination. Limited examinations for reoccurring indications may be performed as noted. The reflux portion of the exam is performed with the patient in reverse Trendelenburg.  +---------+---------------+---------+-----------+----------+--------------+ RIGHT    CompressibilityPhasicitySpontaneityPropertiesThrombus Aging +---------+---------------+---------+-----------+----------+--------------+ CFV      Full           Yes      Yes                                 +---------+---------------+---------+-----------+----------+--------------+ SFJ      Full                                                        +---------+---------------+---------+-----------+----------+--------------+ FV Prox  Full                                                        +---------+---------------+---------+-----------+----------+--------------+ FV Mid   Full                                                        +---------+---------------+---------+-----------+----------+--------------+ FV DistalFull                                                        +---------+---------------+---------+-----------+----------+--------------+ PFV      Full                                                        +---------+---------------+---------+-----------+----------+--------------+ POP      Full           Yes      Yes                                 +---------+---------------+---------+-----------+----------+--------------+ PTV      Full                                                        +---------+---------------+---------+-----------+----------+--------------+ PERO     Full                                                        +---------+---------------+---------+-----------+----------+--------------+   +---------+---------------+---------+-----------+----------+--------------+  LEFT     CompressibilityPhasicitySpontaneityPropertiesThrombus Aging  +---------+---------------+---------+-----------+----------+--------------+ CFV      Full           Yes      Yes                                 +---------+---------------+---------+-----------+----------+--------------+ SFJ      Full                                                        +---------+---------------+---------+-----------+----------+--------------+ FV Prox  Full                                                        +---------+---------------+---------+-----------+----------+--------------+ FV Mid   Full                                                        +---------+---------------+---------+-----------+----------+--------------+ FV DistalFull                                                        +---------+---------------+---------+-----------+----------+--------------+ PFV      Full                                                        +---------+---------------+---------+-----------+----------+--------------+ POP      Full           Yes      Yes                                 +---------+---------------+---------+-----------+----------+--------------+ PTV      Full                                                        +---------+---------------+---------+-----------+----------+--------------+ PERO     Full                                                        +---------+---------------+---------+-----------+----------+--------------+     Summary: BILATERAL: - No evidence of deep vein thrombosis seen in the lower extremities, bilaterally. -No evidence of popliteal cyst, bilaterally.   *See table(s) above for measurements and observations.    Preliminary    CT Angio Chest Pulmonary Embolism (PE)  W or WO Contrast  Result Date: 10/02/2022 CLINICAL DATA:  Short of breath, hypoxia, elevated D-dimer EXAM: CT ANGIOGRAPHY CHEST WITH CONTRAST TECHNIQUE: Multidetector CT imaging of the chest was performed using the standard protocol  during bolus administration of intravenous contrast. Multiplanar CT image reconstructions and MIPs were obtained to evaluate the vascular anatomy. RADIATION DOSE REDUCTION: This exam was performed according to the departmental dose-optimization program which includes automated exposure control, adjustment of the mA and/or kV according to patient size and/or use of iterative reconstruction technique. CONTRAST:  56m OMNIPAQUE IOHEXOL 350 MG/ML SOLN COMPARISON:  10/02/2022, 09/29/2022 FINDINGS: Cardiovascular: This is a technically adequate evaluation of the pulmonary vasculature. There is a filling defect within the anterior basilar segmental branch of the right lower lobe pulmonary artery, with peripheral lung consolidation consistent with pulmonary embolus and infarct. No other pulmonary emboli are identified. The heart is unremarkable without pericardial effusion. No evidence of thoracic aortic aneurysm or dissection. Atherosclerosis of the aorta and coronary vasculature. Mediastinum/Nodes: Continued adenopathy within the AP window measuring up to 2.4 cm in short axis. Thyroid, trachea, and esophagus are unremarkable. Lungs/Pleura: Right shaped consolidation and volume loss within the right lower lobe consistent with pulmonary infarct. Trace bilateral pleural effusions, right greater than left. Stable left pleural calcifications. There is opacification of the left mainstem bronchus as well as the upper and lower lobe segmental bronchi, consistent with mucoid impaction, retained secretions, or aspiration. Patchy airspace disease throughout the left lung consistent with inflammatory or infectious etiology, without significant change since prior exam. No pneumothorax. Upper Abdomen: No acute abnormality. Musculoskeletal: Chronic postsurgical or posttraumatic changes of the left thoracic cage, stable. No acute displaced fracture. Reconstructed images demonstrate no additional findings. Review of the MIP images  confirms the above findings. IMPRESSION: 1. Findings consistent with right lower lobe segmental pulmonary embolus, and associated pulmonary infarct. Minimal clot burden. No evidence of right heart strain. 2. Persistent nodular airspace disease throughout the left lung, compatible with inflammatory or infectious etiology. As per previous recommendation, short interval follow-up recommended to document resolution after appropriate medical management. 3. Opacification of the left mainstem bronchus and segmental upper and lower lobe bronchi, consistent with retained secretions, mucoid impaction, or aspiration. 4. Stable adenopathy at the AP window. 5. Aortic Atherosclerosis (ICD10-I70.0). Coronary artery atherosclerosis. Critical Value/emergent results were called by telephone at the time of interpretation on 10/02/2022 at 8:34 pm to provider DR CROSLEY, who verbally acknowledged these results. Electronically Signed   By: MRanda NgoM.D.   On: 10/02/2022 20:36   DG CHEST PORT 1 VIEW  Result Date: 10/02/2022 CLINICAL DATA:  Shortness of breath. EXAM: PORTABLE CHEST 1 VIEW COMPARISON:  CT chest and chest x-ray dated September 29, 2022. FINDINGS: Interval extubation. Unchanged cardiomegaly. Similar left basilar patchy reticulonodular opacities. Improving interstitial opacity at the right lung base. Unchanged small bilateral pleural effusions. No pneumothorax. No acute osseous abnormality. IMPRESSION: 1. Unchanged left basilar and improving right basilar pneumonia. 2. Unchanged small bilateral pleural effusions. Electronically Signed   By: WTitus DubinM.D.   On: 10/02/2022 10:57       Jamaris Theard M.D. [facility] 10/03/2022, 2:58 PM  Available via Epic secure chat 7am-7pm After 7 pm, please refer to night coverage provider listed on amion.

## 2022-10-04 DIAGNOSIS — K920 Hematemesis: Secondary | ICD-10-CM | POA: Diagnosis not present

## 2022-10-04 DIAGNOSIS — R001 Bradycardia, unspecified: Secondary | ICD-10-CM | POA: Diagnosis not present

## 2022-10-04 DIAGNOSIS — J9601 Acute respiratory failure with hypoxia: Secondary | ICD-10-CM | POA: Diagnosis not present

## 2022-10-04 DIAGNOSIS — A419 Sepsis, unspecified organism: Secondary | ICD-10-CM | POA: Diagnosis not present

## 2022-10-04 LAB — GLUCOSE, CAPILLARY
Glucose-Capillary: 115 mg/dL — ABNORMAL HIGH (ref 70–99)
Glucose-Capillary: 131 mg/dL — ABNORMAL HIGH (ref 70–99)
Glucose-Capillary: 204 mg/dL — ABNORMAL HIGH (ref 70–99)
Glucose-Capillary: 135 mg/dL — ABNORMAL HIGH (ref 70–99)

## 2022-10-04 LAB — CBC
HCT: 26.6 % — ABNORMAL LOW (ref 36.0–46.0)
Hemoglobin: 8.6 g/dL — ABNORMAL LOW (ref 12.0–15.0)
MCH: 28.5 pg (ref 26.0–34.0)
MCHC: 32.3 g/dL (ref 30.0–36.0)
MCV: 88.1 fL (ref 80.0–100.0)
Platelets: 283 K/uL (ref 150–400)
RBC: 3.02 MIL/uL — ABNORMAL LOW (ref 3.87–5.11)
RDW: 15.4 % (ref 11.5–15.5)
WBC: 12.8 K/uL — ABNORMAL HIGH (ref 4.0–10.5)
nRBC: 0 % (ref 0.0–0.2)

## 2022-10-04 LAB — CULTURE, BLOOD (ROUTINE X 2): Culture: NO GROWTH

## 2022-10-04 LAB — BASIC METABOLIC PANEL
Anion gap: 11 (ref 5–15)
BUN: 25 mg/dL — ABNORMAL HIGH (ref 8–23)
CO2: 22 mmol/L (ref 22–32)
Calcium: 8.8 mg/dL — ABNORMAL LOW (ref 8.9–10.3)
Chloride: 104 mmol/L (ref 98–111)
Creatinine, Ser: 0.85 mg/dL (ref 0.44–1.00)
GFR, Estimated: >60 mL/min (ref >60)
Glucose, Bld: 152 mg/dL — ABNORMAL HIGH (ref 70–99)
Potassium: 4.2 mmol/L (ref 3.5–5.1)
Sodium: 137 mmol/L (ref 135–145)

## 2022-10-04 LAB — HEPARIN LEVEL (UNFRACTIONATED): Heparin Unfractionated: 0.22 IU/mL — ABNORMAL LOW (ref 0.30–0.70)

## 2022-10-04 MED ORDER — LISINOPRIL 20 MG PO TABS
20.0000 mg | ORAL_TABLET | Freq: Every day | ORAL | Status: DC
  Administered 2022-10-04 – 2022-10-08 (×5): 20 mg via ORAL
  Filled 2022-10-04 (×5): qty 1

## 2022-10-04 MED ORDER — APIXABAN 5 MG PO TABS: 5.0000 mg | ORAL_TABLET | Freq: Two times a day (BID) | ORAL | Status: DC

## 2022-10-04 MED ORDER — FUROSEMIDE 40 MG PO TABS
40.0000 mg | ORAL_TABLET | Freq: Every day | ORAL | Status: DC
  Administered 2022-10-04 – 2022-10-08 (×5): 40 mg via ORAL
  Filled 2022-10-04 (×5): qty 1

## 2022-10-04 MED ORDER — HEPARIN BOLUS VIA INFUSION
1000.0000 [IU] | Freq: Once | INTRAVENOUS | Status: DC
  Filled 2022-10-04: qty 1000

## 2022-10-04 MED ORDER — APIXABAN 5 MG PO TABS
10.0000 mg | ORAL_TABLET | Freq: Two times a day (BID) | ORAL | Status: DC
  Administered 2022-10-04 – 2022-10-08 (×9): 10 mg via ORAL
  Filled 2022-10-04 (×9): qty 2

## 2022-10-04 NOTE — TOC Progression Note (Addendum)
Transition of Care [facility]) - Progression Note    Patient Details  Name: Nicole [name] MRN: [id number] Date of Birth: 10/08/1935  [facility] CM/SW Contact  [name], LCSWA Phone Number: 10/04/2022, 2:09 PM  Clinical Narrative:     CSW was informed by RN patients son n law reports [facility] has rehab bed that is open. CSW spoke with patient at bedside to confirm dc plan. Patient informed CSW initially she wanted to return home to her independent living but now is agreeable to go to short term rehab at [facility].  CSW called [facility] CSW was informed Graceanne in admissions not there and to follow up with her Monday morning on bed availability. CSW to follow up with Graceanne Monday morning.CSW updated patients daughter Selena Batten. CSW will continue to follow and assist with patients dc planning needs.  Expected Discharge Plan: Home w Home Health Services Barriers to Discharge: Continued Medical Work up  Expected Discharge Plan and Services Expected Discharge Plan: Home w Home Health Services In-house Referral: Clinical Social Work     Living arrangements for the past 2 months: Independent Living Facility Administrator, [facility])                                       Social Determinants of Health (SDOH) Interventions    Readmission Risk Interventions     No data to display

## 2022-10-04 NOTE — Progress Notes (Signed)
ANTICOAGULATION CONSULT NOTE   Pharmacy Consult for Heparin  Indication: pulmonary embolus  Allergies  Allergen Reactions   Codeine Nausea Only and Other (See Comments)    Dizziness    Penicillins Other (See Comments)    Unknown    Patient Measurements: Height: 5\' 2"  (157.5 cm) Weight: 46.6 kg (102 lb 11.2 oz) IBW/kg (Calculated) : 50.1  Vital Signs: Temp: 97.7 F (36.5 C) (12/15 2044) Temp Source: Oral (12/15 2044) BP: 138/80 (12/15 2044) Pulse Rate: 99 (12/15 2044)  Labs: Recent Labs    10/02/22 0211 10/02/22 1007 10/03/22 0540 10/03/22 1631 10/04/22 0134  HGB 7.6* 8.6* 9.5*  --  8.6*  HCT 22.4* 25.7* 28.6*  --  26.6*  PLT 265  --  326  --  283  HEPARINUNFRC  --   --   --  <0.10* 0.22*  CREATININE 0.79  --  1.05*  --  0.85     Estimated Creatinine Clearance: 35.6 mL/min (by C-G formula based on SCr of 0.85 mg/dL).   Medical History: History reviewed. No pertinent past medical history.  Assessment: 87 y/o F with new onset PE, starting heparin, Hgb 8.6, renal function ok.   12/16 AM update:  Heparin level sub-therapeutic   Goal of Therapy:  Heparin level 0.3-0.7 units/ml Monitor platelets by anticoagulation protocol: Yes   Plan:  Heparin 1000 units [employer] heparin to 900 units/hr 1100 Heparin level Daily CBC/Heparin level Monitor for bleeding  1/17, PharmD, BCPS Clinical Pharmacist Phone: [phone]

## 2022-10-04 NOTE — Progress Notes (Signed)
ANTICOAGULATION CONSULT NOTE -Follow Up  Pharmacy Consult for Apixaban  Indication: pulmonary embolus  Allergies  Allergen Reactions   Codeine Nausea Only and Other (See Comments)    Dizziness    Penicillins Other (See Comments)    Unknown    Patient Measurements: Height: 5\' 2"  (157.5 cm) Weight: 46.9 kg (103 lb 6.4 oz) IBW/kg (Calculated) : 50.1  Vital Signs: Temp: 97.9 F (36.6 C) (12/16 0640) Temp Source: Oral (12/16 0640) BP: 121/62 (12/16 0640) Pulse Rate: 87 (12/16 0640)  Labs: Recent Labs    10/02/22 0211 10/02/22 1007 10/03/22 0540 10/03/22 1631 10/04/22 0134  HGB 7.6* 8.6* 9.5*  --  8.6*  HCT 22.4* 25.7* 28.6*  --  26.6*  PLT 265  --  326  --  283  HEPARINUNFRC  --   --   --  <0.10* 0.22*  CREATININE 0.79  --  1.05*  --  0.85     Estimated Creatinine Clearance: 35.8 mL/min (by C-G formula based on SCr of 0.85 mg/dL).   Medical History: History reviewed. No pertinent past medical history.  Assessment: 87 y/o F with new onset PE,Hgb 8.6, renal function ok. Patient to be transitioned from heparin to eliquis for PE. Pharmacy consulted to dose.  Plan:  Start eliquis 10 mg po two times daily for 7 days, followed by eliquis 5mg  po two times daily Daily CBC Monitor for bleeding  83, PharmD. [facility] PGY-1  10/04/2022 7:48 AM

## 2022-10-04 NOTE — Progress Notes (Signed)
   10/04/22 1613  Mobility  Activity Ambulated with assistance in hallway  Level of Assistance Contact guard assist, steadying assist  Assistive Device Front wheel walker  Distance Ambulated (ft) 200 ft  Activity Response Tolerated well  Mobility Referral Yes  $Mobility charge 1 Mobility   Mobility Specialist Progress Note  Pre-Mobility: 96% SpO2(RA) During Mobility: 90 SpO2(RA) Post-Mobility: 94% SpO2(RA)  Pt was in bed and agreeable. Had no c/o pain throughout ambulation. Returned to bed w/ all needs met and call bell in reach.   Lucious Groves Mobility Specialist  Please contact via SecureChat or Rehab office at [phone]

## 2022-10-04 NOTE — Progress Notes (Signed)
[facility]                                                                              Nicole [name], is a 87 y.o. female, DOB - 1935/05/10, MLJ:[id number] Admit date - 09/29/2022    Outpatient Primary MD for the patient is Sherrie Sport, MD  LOS - 5  days  Chief Complaint  Patient presents with   Shortness of Breath   Bradycardia   Emesis       Brief summary   Patient is a 87 year old female with no known past medical history on file presented to ED via EMS from her independent living facility with shortness of breath, bradycardia and emesis.  In ED, she was found to be hypoxic, bradycardic and hypotensive.  She was intubated and code sepsis was called.  Blood cultures obtained.  Patient was placed on IV fluids and antibiotics. Documentation was later sent with the patient as DNR. WBCs 28.8, hemoglobin 9.3, sodium 125, potassium 6.6, lactate 5.3, creatinine 1.92. VBG 7.27/37.03/15/16.3. Temp 92 F. CXR concerning for aspiration. Reports of coffee ground emesis. Scant dark ngt output and emesis on face.  Patient was admitted to the ICU by CCM. She was extubated on 12/12, weaned off of pressors, transferred to the floor  [facility] assumed care on 12/13  Assessment & Plan    Principal Problem: Acute metabolic encephalopathy -Possibly due to sepsis and metabolic derangements, may have underlying mild dementia -CT head negative for any acute intracranial abnormality -Resolved, patient is alert and oriented, at baseline   Active problems Acute respiratory failure with hypoxia suspected secondary to pneumonia versus aspiration, has underlying history of bronchiectasis -CT chest showed chronic indolent atypical infectious process such as MAI however, the possibility of neoplasm is not entirely excluded, recommended follow-up noncontrast chest CT in 3 months -Patient was extubated on 12/12 -On 10/14, noted to be more short of breath,  escalated O2 requirements to 5 L via  [place], bibasilar [place] -Placed on Lasix 40 mg IV x 2, continue Xopenex, prednisone, wean O2 as tolerated, sats improving 98% on 2 L   Acute on chronic diastolic CHF  -CXR with bilateral pleural effusions, BNP 755.3, D-dimer 3.18. -Continue Lasix 40 mg IV daily x 2 doses, reassess in a.m., may need low-dose Lasix -2D echo 12/11 had shown EF of 55 to 60% with grade 3 DD   Acute pulmonary embolism -CTA chest showed right lobe segmental pulmonary embolus with associated pulmonary infarct, minimal clot burden.  No right heart strain -2D echo 12/11 had shown EF of 55 to 60% with grade 3 DD, right ventricular systolic function normal, mild to moderate TR, moderate AS. -Venous Dopplers bilateral was negative for DVT -Was started on IV heparin drip, will transition to oral eliquis today  Bradycardia likely due to hypothermia and electrolyte abnormalities -On admission noted to be bradycardiac with heart rate in low 40s, hypothermia with temp of 92.1 F -Lisinopril, HCTZ, verapamil placed on hold on admission -Metoprolol resumed  Acute kidney injury, lactic acidosis -Presented with sodium of 125, potassium 6.6, creatinine of 1.92  -Baseline creatinine 1.3 on 06/27/2022.  Lisinopril, HCTZ held, received  IV fluid hydration. -Renal function improved   Hyperkalemia, Hyponatremia -Potassium 6.6, sodium 125 on admission -Continue to hold HCTZ as patient is on Lasix -Potassium, sodium improved   Acute on chronic blood loss anemia, ?  Coffee-ground emesis -Hemoglobin 10.6 on 06/27/2022 -Continue PPI, H&H stable, 8.6, monitor closely with anticoagulation    Transaminitis AST 188, ALT 109, suspect secondary to shock -LFTs trending down, continue to hold statins    Essential hypertension -BP now elevated continue beta-blocker, resume lisinopril 20 mg daily, Lasix 40 mg p.o. daily -Continue to hold verapamil, HCTZ  Left hand pain, right shoulder pain -Hand x-ray showed moderate thumb  carpometacarpal osteoarthritis, mild to moderate interphalangeal osteoarthritis diffusely throughout the first through fifth digits -Right shoulder x-ray showed moderate to severe glenohumeral osteoarthritis, mild acromioclavicular osteoarthritis -CRP, ESR, uric acid within normal limits -Tramadol for moderate to severe pain as needed  Hyperlipidemia -Hold statin due to transaminitis  Hypothyroidism -TSH 3.4, continue Synthroid  Moderate protein calorie malnutrition Etiology: social / environmental circumstances (inadequate oral intake, decreasing appetite) Signs/Symptoms: mild fat depletion, mild muscle depletion, moderate muscle depletion Estimated body mass index is 18.91 kg/m as calculated from the following:   Height as of this encounter: _0  (1.575 m).   Weight as of this encounter: 46.9 kg.  Code Status: DNR DVT Prophylaxis:  heparin bolus via infusion 1,000 Units Start: 10/04/22 0330 SCDs Start: 09/29/22 0340 apixaban (ELIQUIS) tablet 10 mg  apixaban (ELIQUIS) tablet 5 mg   Level of Care: Level of care: Telemetry Medical Family Communication: Updated patient's daughter, [name] on the phone on 12/15.  Disposition Plan:      Remains inpatient appropriate: Needs SNF, likely DC on Monday if continues to improve  Procedures:  Intubation, extubation 2D echo  Consultants:   Patient was admitted by CCM  Antimicrobials:   Anti-infectives (From admission, onward)    Start     Dose/Rate Route Frequency Ordered Stop   10/01/22 1215  azithromycin (ZITHROMAX) tablet 500 mg        500 mg Oral Daily 10/01/22 1123     09/29/22 1700  ceFEPIme (MAXIPIME) 2 g in sodium chloride 0.9 % 100 mL IVPB  Status:  Discontinued        2 g 200 mL/hr over 30 Minutes Intravenous Every 12 hours 09/29/22 1142 09/29/22 1148   09/29/22 1700  cefTRIAXone (ROCEPHIN) 2 g in sodium chloride 0.9 % 100 mL IVPB        2 g 200 mL/hr over 30 Minutes Intravenous Every 24 hours 09/29/22 1152      09/29/22 1245  azithromycin (ZITHROMAX) 500 mg in sodium chloride 0.9 % 250 mL IVPB  Status:  Discontinued        500 mg 250 mL/hr over 60 Minutes Intravenous Every 24 hours 09/29/22 1148 10/01/22 1123   09/29/22 0501  vancomycin variable dose per unstable renal function (pharmacist dosing)  Status:  Discontinued         Does not apply See admin instructions 09/29/22 0501 09/29/22 1148   09/29/22 0500  vancomycin (VANCOREADY) IVPB 1250 mg/250 mL        1,250 mg 166.7 mL/hr over 90 Minutes Intravenous  Once 09/29/22 0455 09/29/22 0652   09/29/22 0300  ceFEPIme (MAXIPIME) 2 g in sodium chloride 0.9 % 100 mL IVPB        2 g 200 mL/hr over 30 Minutes Intravenous  Once 09/29/22 0245 09/29/22 0351          Medications  apixaban  10 mg Oral BID   Followed by   Derrill Memo ON 10/11/2022] apixaban  5 mg Oral BID   azithromycin  500 mg Oral Daily   feeding supplement  237 mL Oral BID BM   heparin  1,000 Units Intravenous Once   insulin aspart  0-5 Units Subcutaneous QHS   insulin aspart  0-9 Units Subcutaneous TID WC   levalbuterol  0.63 mg Nebulization BID   levothyroxine  75 mcg Oral Q0600   metoprolol tartrate  25 mg Oral BID   mometasone-formoterol  2 puff Inhalation BID   montelukast  10 mg Oral Daily   pantoprazole (PROTONIX) IV  40 mg Intravenous Q12H   predniSONE  40 mg Oral QAC breakfast   umeclidinium bromide  1 puff Inhalation Daily      Subjective:   Nicole [name] was seen and examined today.  No acute chest pain, nausea or vomiting.  Tolerating diet.  Still somewhat congested, no fevers.  BP elevated. Objective:   Vitals:   10/04/22 0640 10/04/22 0806 10/04/22 1158 10/04/22 1225  BP: 121/62 (!) 118/105 (!) 162/115 (!) 172/91  Pulse: 87 84 74 76  Resp: _0 Temp: 97.9 F (36.6 C) 97.9 F (36.6 C) 98.1 F (36.7 C) 97.6 F (36.4 C)  TempSrc: Oral Oral Oral Oral  SpO2: 93% 92% 97% 98%  Weight: 46.9 kg     Height:        Intake/Output Summary (Last 24 hours)  at 10/04/2022 1252 Last data filed at 10/04/2022 1200 Gross per 24 hour  Intake 434.39 ml  Output 1150 ml  Net -715.61 ml     Wt Readings from Last 3 Encounters:  10/04/22 46.9 kg   Physical Exam General: Alert and oriented x 3, NAD Cardiovascular: S1 S2 clear, RRR.  Respiratory: Bilateral scattered rhonchi Gastrointestinal: Soft, nontender, nondistended, NBS Ext: no pedal edema bilaterally Neuro: no new deficits Psych: Normal affect  Data Reviewed:  I have personally reviewed following labs    CBC Lab Results  Component Value Date   WBC 12.8 (H) 10/04/2022   RBC 3.02 (L) 10/04/2022   HGB 8.6 (L) 10/04/2022   HCT 26.6 (L) 10/04/2022   MCV 88.1 10/04/2022   MCH 28.5 10/04/2022   PLT 283 10/04/2022   MCHC 32.3 10/04/2022   RDW 15.4 10/04/2022   LYMPHSABS 1.9 06/27/2022   MONOABS 1.0 06/27/2022   EOSABS 0.2 06/27/2022   BASOSABS 0.1 14/78/2956     Last metabolic panel Lab Results  Component Value Date   NA 137 10/04/2022   K 4.2 10/04/2022   CL 104 10/04/2022   CO2 22 10/04/2022   BUN 25 (H) 10/04/2022   CREATININE 0.85 10/04/2022   GLUCOSE 152 (H) 10/04/2022   GFRNONAA >60 10/04/2022   CALCIUM 8.8 (L) 10/04/2022   PHOS 3.9 09/29/2022   PROT 5.4 (L) 10/02/2022   ALBUMIN 2.8 (L) 10/02/2022   BILITOT 0.2 (L) 10/02/2022   ALKPHOS 83 10/02/2022   AST 33 10/02/2022   ALT 72 (H) 10/02/2022   ANIONGAP 11 10/04/2022    CBG (last 3)  Recent Labs    10/03/22 2115 10/04/22 0805 10/04/22 1156  GLUCAP 226* 115* 131*      Coagulation Profile: Recent Labs  Lab 09/29/22 0124  INR 1.1     Radiology Studies: I have personally reviewed the imaging studies  VAS [place] LOWER EXTREMITY VENOUS (DVT)  Result Date: 10/03/2022  Lower Venous DVT Study Patient Name:  Nicole  Koren [name]  Date of Exam:   10/03/2022 Medical Rec #: [id number]      Accession #:    [id number] Date of Birth: 10/16/1935      Patient Gender: F Patient Age:   27 years Exam Location:  [facility] Procedure:      VAS [place] LOWER EXTREMITY VENOUS (DVT) Referring Phys: DEBBY CROSLEY --------------------------------------------------------------------------------  Indications: Pulmonary embolism.  Comparison Study: no prior Performing Technologist: Archie Patten RVS  Examination Guidelines: A complete evaluation includes B-mode imaging, spectral Doppler, color Doppler, and power Doppler as needed of all accessible portions of each vessel. Bilateral testing is considered an integral part of a complete examination. Limited examinations for reoccurring indications may be performed as noted. The reflux portion of the exam is performed with the patient in reverse Trendelenburg.  +---------+---------------+---------+-----------+----------+--------------+ RIGHT    CompressibilityPhasicitySpontaneityPropertiesThrombus Aging +---------+---------------+---------+-----------+----------+--------------+ CFV      Full           Yes      Yes                                 +---------+---------------+---------+-----------+----------+--------------+ SFJ      Full                                                        +---------+---------------+---------+-----------+----------+--------------+ FV Prox  Full                                                        +---------+---------------+---------+-----------+----------+--------------+ FV Mid   Full                                                        +---------+---------------+---------+-----------+----------+--------------+ FV DistalFull                                                        +---------+---------------+---------+-----------+----------+--------------+ PFV      Full                                                        +---------+---------------+---------+-----------+----------+--------------+ POP      Full           Yes      Yes                                  +---------+---------------+---------+-----------+----------+--------------+ PTV      Full                                                        +---------+---------------+---------+-----------+----------+--------------+  PERO     Full                                                        +---------+---------------+---------+-----------+----------+--------------+   +---------+---------------+---------+-----------+----------+--------------+ LEFT     CompressibilityPhasicitySpontaneityPropertiesThrombus Aging +---------+---------------+---------+-----------+----------+--------------+ CFV      Full           Yes      Yes                                 +---------+---------------+---------+-----------+----------+--------------+ SFJ      Full                                                        +---------+---------------+---------+-----------+----------+--------------+ FV Prox  Full                                                        +---------+---------------+---------+-----------+----------+--------------+ FV Mid   Full                                                        +---------+---------------+---------+-----------+----------+--------------+ FV DistalFull                                                        +---------+---------------+---------+-----------+----------+--------------+ PFV      Full                                                        +---------+---------------+---------+-----------+----------+--------------+ POP      Full           Yes      Yes                                 +---------+---------------+---------+-----------+----------+--------------+ PTV      Full                                                        +---------+---------------+---------+-----------+----------+--------------+ PERO     Full                                                         +---------+---------------+---------+-----------+----------+--------------+  Summary: BILATERAL: - No evidence of deep vein thrombosis seen in the lower extremities, bilaterally. -No evidence of popliteal cyst, bilaterally.   *See table(s) above for measurements and observations. Electronically signed by Jamelle Haring on 10/03/2022 at 6:04:43 PM.    Final    CT Angio Chest Pulmonary Embolism (PE) W or WO Contrast  Result Date: 10/02/2022 CLINICAL DATA:  Short of breath, hypoxia, elevated D-dimer EXAM: CT ANGIOGRAPHY CHEST WITH CONTRAST TECHNIQUE: Multidetector CT imaging of the chest was performed using the standard protocol during bolus administration of intravenous contrast. Multiplanar CT image reconstructions and MIPs were obtained to evaluate the vascular anatomy. RADIATION DOSE REDUCTION: This exam was performed according to the departmental dose-optimization program which includes automated exposure control, adjustment of the mA and/or kV according to patient size and/or use of iterative reconstruction technique. CONTRAST:  78m OMNIPAQUE IOHEXOL 350 MG/ML SOLN COMPARISON:  10/02/2022, 09/29/2022 FINDINGS: Cardiovascular: This is a technically adequate evaluation of the pulmonary vasculature. There is a filling defect within the anterior basilar segmental branch of the right lower lobe pulmonary artery, with peripheral lung consolidation consistent with pulmonary embolus and infarct. No other pulmonary emboli are identified. The heart is unremarkable without pericardial effusion. No evidence of thoracic aortic aneurysm or dissection. Atherosclerosis of the aorta and coronary vasculature. Mediastinum/Nodes: Continued adenopathy within the AP window measuring up to 2.4 cm in short axis. Thyroid, trachea, and esophagus are unremarkable. Lungs/Pleura: Right shaped consolidation and volume loss within the right lower lobe consistent with pulmonary infarct. Trace bilateral pleural effusions, right greater  than left. Stable left pleural calcifications. There is opacification of the left mainstem bronchus as well as the upper and lower lobe segmental bronchi, consistent with mucoid impaction, retained secretions, or aspiration. Patchy airspace disease throughout the left lung consistent with inflammatory or infectious etiology, without significant change since prior exam. No pneumothorax. Upper Abdomen: No acute abnormality. Musculoskeletal: Chronic postsurgical or posttraumatic changes of the left thoracic cage, stable. No acute displaced fracture. Reconstructed images demonstrate no additional findings. Review of the MIP images confirms the above findings. IMPRESSION: 1. Findings consistent with right lower lobe segmental pulmonary embolus, and associated pulmonary infarct. Minimal clot burden. No evidence of right heart strain. 2. Persistent nodular airspace disease throughout the left lung, compatible with inflammatory or infectious etiology. As per previous recommendation, short interval follow-up recommended to document resolution after appropriate medical management. 3. Opacification of the left mainstem bronchus and segmental upper and lower lobe bronchi, consistent with retained secretions, mucoid impaction, or aspiration. 4. Stable adenopathy at the AP window. 5. Aortic Atherosclerosis (ICD10-I70.0). Coronary artery atherosclerosis. Critical Value/emergent results were called by telephone at the time of interpretation on 10/02/2022 at 8:34 pm to provider DR CROSLEY, who verbally acknowledged these results. Electronically Signed   By: MRanda NgoM.D.   On: 10/02/2022 20:36       Windi Toro M.D. [facility] 10/04/2022, 12:52 PM  Available via Epic secure chat 7am-7pm After 7 pm, please refer to night coverage provider listed on amion.

## 2022-10-05 DIAGNOSIS — A419 Sepsis, unspecified organism: Secondary | ICD-10-CM | POA: Diagnosis not present

## 2022-10-05 DIAGNOSIS — R001 Bradycardia, unspecified: Secondary | ICD-10-CM | POA: Diagnosis not present

## 2022-10-05 DIAGNOSIS — K920 Hematemesis: Secondary | ICD-10-CM | POA: Diagnosis not present

## 2022-10-05 DIAGNOSIS — J9601 Acute respiratory failure with hypoxia: Secondary | ICD-10-CM | POA: Diagnosis not present

## 2022-10-05 LAB — BASIC METABOLIC PANEL
Anion gap: 9 (ref 5–15)
BUN: 20 mg/dL (ref 8–23)
CO2: 27 mmol/L (ref 22–32)
Calcium: 8.9 mg/dL (ref 8.9–10.3)
Chloride: 104 mmol/L (ref 98–111)
Creatinine, Ser: 0.81 mg/dL (ref 0.44–1.00)
GFR, Estimated: >60 mL/min (ref >60)
Glucose, Bld: 115 mg/dL — ABNORMAL HIGH (ref 70–99)
Potassium: 3.3 mmol/L — ABNORMAL LOW (ref 3.5–5.1)
Sodium: 140 mmol/L (ref 135–145)

## 2022-10-05 LAB — GLUCOSE, CAPILLARY
Glucose-Capillary: 88 mg/dL (ref 70–99)
Glucose-Capillary: 130 mg/dL — ABNORMAL HIGH (ref 70–99)
Glucose-Capillary: 176 mg/dL — ABNORMAL HIGH (ref 70–99)
Glucose-Capillary: 154 mg/dL — ABNORMAL HIGH (ref 70–99)

## 2022-10-05 LAB — CBC
HCT: 25.3 % — ABNORMAL LOW (ref 36.0–46.0)
Hemoglobin: 8.4 g/dL — ABNORMAL LOW (ref 12.0–15.0)
MCH: 28.6 pg (ref 26.0–34.0)
MCHC: 33.2 g/dL (ref 30.0–36.0)
MCV: 86.1 fL (ref 80.0–100.0)
Platelets: 289 10*3/uL (ref 150–400)
RBC: 2.94 MIL/uL — ABNORMAL LOW (ref 3.87–5.11)
RDW: 15.2 % (ref 11.5–15.5)
WBC: 11.3 10*3/uL — ABNORMAL HIGH (ref 4.0–10.5)
nRBC: 0 % (ref 0.0–0.2)

## 2022-10-05 MED ORDER — POTASSIUM CHLORIDE CRYS ER 20 MEQ PO TBCR
40.0000 meq | EXTENDED_RELEASE_TABLET | Freq: Once | ORAL | Status: AC
  Administered 2022-10-05: 40 meq via ORAL
  Filled 2022-10-05: qty 2

## 2022-10-05 NOTE — Progress Notes (Signed)
Mobility Specialist Progress Note    10/05/22 1513  Mobility  Activity Ambulated with assistance in hallway  Level of Assistance Contact guard assist, steadying assist  Assistive Device Front wheel walker  Distance Ambulated (ft) 250 ft  Activity Response Tolerated well  Mobility Referral Yes  $Mobility charge 1 Mobility   Pre-Mobility: 56 HR During Mobility: 86 HR Post-Mobility: 72 HR, 95% SpO2  Pt received in chair and agreeable. No complaints on walk. Returned to chair with call bell in reach. Left on RA.   [place] Mobility Specialist  Please Neurosurgeon or Rehab Office at [phone]

## 2022-10-05 NOTE — Progress Notes (Signed)
[facility]                                                                              Nicole [name], is a 87 y.o. female, DOB - 1934-11-10, WIO:[id number] Admit date - 09/29/2022    Outpatient Primary MD for the patient is Nicole Sport, MD  LOS - 6  days  Chief Complaint  Patient presents with   Shortness of Breath   Bradycardia   Emesis       Brief summary   Patient is a 87 year old female with no known past medical history on file presented to ED via EMS from her independent living facility with shortness of breath, bradycardia and emesis.  In ED, she was found to be hypoxic, bradycardic and hypotensive.  She was intubated and code sepsis was called.  Blood cultures obtained.  Patient was placed on IV fluids and antibiotics. Documentation was later sent with the patient as DNR. WBCs 28.8, hemoglobin 9.3, sodium 125, potassium 6.6, lactate 5.3, creatinine 1.92. VBG 7.27/37.03/15/16.3. Temp 92 F. CXR concerning for aspiration. Reports of coffee ground emesis. Scant dark ngt output and emesis on face.  Patient was admitted to the ICU by CCM. She was extubated on 12/12, weaned off of pressors, transferred to the floor  [facility] assumed care on 12/13  Assessment & Plan    Principal Problem: Acute metabolic encephalopathy -Possibly due to sepsis and metabolic derangements, may have underlying mild dementia -CT head negative for any acute intracranial abnormality -Mental status back to baseline, alert and oriented   Active problems Acute respiratory failure with hypoxia suspected secondary to pneumonia versus aspiration, has underlying history of bronchiectasis -CT chest showed chronic indolent atypical infectious process such as MAI however, the possibility of neoplasm is not entirely excluded, recommended follow-up noncontrast chest CT in 3 months -Patient was extubated on 12/12. -On 10/14, noted to be more short of breath,  escalated O2 requirements to 5 L via [place],  bibasilar Rales, received IV Lasix 40 mg IV x 2, started on prednisone.  CTA positive for acute PE -Stable, O2 sats 96% on 2 L   Acute on chronic diastolic CHF  -CXR with bilateral pleural effusions, BNP 755.3, D-dimer 3.18. -Continue Lasix 40 mg IV daily x 2 doses, placed on oral Lasix 40 mg daily -2D echo 12/11 had shown EF of 55 to 60% with grade 3 DD -Negative of 1.4 L   Acute pulmonary embolism -CTA chest showed right lobe segmental pulmonary embolus with associated pulmonary infarct, minimal clot burden.  No right heart strain -2D echo 12/11 had shown EF of 55 to 60% with grade 3 DD, right ventricular systolic function normal, mild to moderate TR, moderate AS. -Venous Dopplers bilateral was negative for DVT -Transition to oral eliquis on 12/16  Bradycardia likely due to hypothermia and electrolyte abnormalities -On admission noted to be bradycardiac with heart rate in low 40s, hypothermia with temp of 92.1 F Heart rate now improved, continue metoprolol  Acute kidney injury, lactic acidosis -Presented with sodium of 125, potassium 6.6, creatinine of 1.92  -Baseline creatinine 1.3 on 06/27/2022.   -Lisinopril, HCTZ held, received IV fluid  hydration. -Renal function has improved, back to baseline 0.8   Hyperkalemia, Hyponatremia -Potassium 6.6, sodium 125 on admission -Continue to hold HCTZ as patient is on Lasix -Potassium, sodium improved   Acute on chronic blood loss anemia, ?  Coffee-ground emesis -Hemoglobin 10.6 on 06/27/2022 -Continue PPI, H&H stable -Monitor with ongoing anticoagulation    Transaminitis AST 188, ALT 109, suspect secondary to shock -LFTs trending down, continue to hold statins    Essential hypertension -Continue beta-blocker, resumed lisinopril, continue Lasix 40 mg daily  -Continue to hold verapamil, HCTZ  Left hand pain, right shoulder pain -Hand x-ray showed moderate thumb carpometacarpal osteoarthritis, mild to moderate interphalangeal  osteoarthritis diffusely throughout the first through fifth digits -Right shoulder x-ray showed moderate to severe glenohumeral osteoarthritis, mild acromioclavicular osteoarthritis -CRP, ESR, uric acid within normal limits -Tramadol for moderate to severe pain as needed  Hyperlipidemia -Hold statin due to transaminitis  Hypothyroidism -TSH 3.4, continue Synthroid  Moderate protein calorie malnutrition Etiology: social / environmental circumstances (inadequate oral intake, decreasing appetite) Signs/Symptoms: mild fat depletion, mild muscle depletion, moderate muscle depletion Estimated body mass index is 18.67 kg/m as calculated from the following:   Height as of this encounter: _0  (1.575 m).   Weight as of this encounter: 46.3 kg.  Code Status: DNR DVT Prophylaxis:  heparin bolus via infusion 1,000 Units Start: 10/04/22 0330 SCDs Start: 09/29/22 0340 apixaban (ELIQUIS) tablet 10 mg  apixaban (ELIQUIS) tablet 5 mg   Level of Care: Level of care: Telemetry Medical Family Communication: Updated patient's daughter, Nicole [name] on the phone on 12/15.  Disposition Plan:      Remains inpatient appropriate: Patient stable, hopefully DC to SNF in a.m.  Procedures:  Intubation, extubation 2D echo  Consultants:   Patient was admitted by CCM  Antimicrobials:   Anti-infectives (From admission, onward)    Start     Dose/Rate Route Frequency Ordered Stop   10/01/22 1215  azithromycin (ZITHROMAX) tablet 500 mg        500 mg Oral Daily 10/01/22 1123     09/29/22 1700  ceFEPIme (MAXIPIME) 2 g in sodium chloride 0.9 % 100 mL IVPB  Status:  Discontinued        2 g 200 mL/hr over 30 Minutes Intravenous Every 12 hours 09/29/22 1142 09/29/22 1148   09/29/22 1700  cefTRIAXone (ROCEPHIN) 2 g in sodium chloride 0.9 % 100 mL IVPB        2 g 200 mL/hr over 30 Minutes Intravenous Every 24 hours 09/29/22 1152     09/29/22 1245  azithromycin (ZITHROMAX) 500 mg in sodium chloride 0.9 % 250 mL  IVPB  Status:  Discontinued        500 mg 250 mL/hr over 60 Minutes Intravenous Every 24 hours 09/29/22 1148 10/01/22 1123   09/29/22 0501  vancomycin variable dose per unstable renal function (pharmacist dosing)  Status:  Discontinued         Does not apply See admin instructions 09/29/22 0501 09/29/22 1148   09/29/22 0500  vancomycin (VANCOREADY) IVPB 1250 mg/250 mL        1,250 mg 166.7 mL/hr over 90 Minutes Intravenous  Once 09/29/22 0455 09/29/22 0652   09/29/22 0300  ceFEPIme (MAXIPIME) 2 g in sodium chloride 0.9 % 100 mL IVPB        2 g 200 mL/hr over 30 Minutes Intravenous  Once 09/29/22 0245 09/29/22 0351          Medications  apixaban  10 mg Oral  BID   Followed by   Derrill Memo ON 10/11/2022] apixaban  5 mg Oral BID   azithromycin  500 mg Oral Daily   feeding supplement  237 mL Oral BID BM   furosemide  40 mg Oral Daily   heparin  1,000 Units Intravenous Once   insulin aspart  0-5 Units Subcutaneous QHS   insulin aspart  0-9 Units Subcutaneous TID WC   levalbuterol  0.63 mg Nebulization BID   levothyroxine  75 mcg Oral Q0600   lisinopril  20 mg Oral Daily   metoprolol tartrate  25 mg Oral BID   mometasone-formoterol  2 puff Inhalation BID   montelukast  10 mg Oral Daily   pantoprazole (PROTONIX) IV  40 mg Intravenous Q12H   predniSONE  40 mg Oral QAC breakfast   umeclidinium bromide  1 puff Inhalation Daily      Subjective:   Nicole [name] was seen and examined today.  No acute complaints, shortness of breath has been improving, ambulating in the room.  On 2 L O2.  No chest pain, fevers or chills.  Objective:   Vitals:   10/05/22 0527 10/05/22 0750 10/05/22 0844 10/05/22 0953  BP: (!) 141/95  (!) 143/93   Pulse: 72 77 72   Resp: 16  16   Temp: 97.6 F (36.4 C)  98.7 F (37.1 C)   TempSrc: Oral  Oral   SpO2: 92% 92% 95% 96%  Weight: 46.3 kg     Height:        Intake/Output Summary (Last 24 hours) at 10/05/2022 1425 Last data filed at 10/04/2022  2122 Gross per 24 hour  Intake --  Output 600 ml  Net -600 ml     Wt Readings from Last 3 Encounters:  10/05/22 46.3 kg    Physical Exam General: Alert and oriented x 3, NAD Cardiovascular: S1 S2 clear, RRR.  Respiratory: Scattered rhonchi improving Gastrointestinal: Soft, nontender, nondistended, NBS Ext: no pedal edema bilaterally Neuro: no new deficits, ambulating in the room Psych: Normal affect, pleasant  Data Reviewed:  I have personally reviewed following labs    CBC Lab Results  Component Value Date   WBC 11.3 (H) 10/05/2022   RBC 2.94 (L) 10/05/2022   HGB 8.4 (L) 10/05/2022   HCT 25.3 (L) 10/05/2022   MCV 86.1 10/05/2022   MCH 28.6 10/05/2022   PLT 289 10/05/2022   MCHC 33.2 10/05/2022   RDW 15.2 10/05/2022   LYMPHSABS 1.9 06/27/2022   MONOABS 1.0 06/27/2022   EOSABS 0.2 06/27/2022   BASOSABS 0.1 75/88/3254     Last metabolic panel Lab Results  Component Value Date   NA 140 10/05/2022   K 3.3 (L) 10/05/2022   CL 104 10/05/2022   CO2 27 10/05/2022   BUN 20 10/05/2022   CREATININE 0.81 10/05/2022   GLUCOSE 115 (H) 10/05/2022   GFRNONAA >60 10/05/2022   CALCIUM 8.9 10/05/2022   PHOS 3.9 09/29/2022   PROT 5.4 (L) 10/02/2022   ALBUMIN 2.8 (L) 10/02/2022   BILITOT 0.2 (L) 10/02/2022   ALKPHOS 83 10/02/2022   AST 33 10/02/2022   ALT 72 (H) 10/02/2022   ANIONGAP 9 10/05/2022    CBG (last 3)  Recent Labs    10/04/22 2110 10/05/22 0839 10/05/22 1124  GLUCAP 135* 88 130*      Coagulation Profile: Recent Labs  Lab 09/29/22 0124  INR 1.1     Radiology Studies: I have personally reviewed the imaging studies  No results found.  Estill Cotta M.D. [facility] 10/05/2022, 2:25 PM  Available via Epic secure chat 7am-7pm After 7 pm, please refer to night coverage provider listed on amion.

## 2022-10-06 ENCOUNTER — Other Ambulatory Visit (HOSPITAL_COMMUNITY): Payer: Self-pay

## 2022-10-06 LAB — CBC
HCT: 24.1 % — ABNORMAL LOW (ref 36.0–46.0)
Hemoglobin: 7.9 g/dL — ABNORMAL LOW (ref 12.0–15.0)
MCH: 28.6 pg (ref 26.0–34.0)
MCHC: 32.8 g/dL (ref 30.0–36.0)
MCV: 87.3 fL (ref 80.0–100.0)
Platelets: 286 10*3/uL (ref 150–400)
RBC: 2.76 MIL/uL — ABNORMAL LOW (ref 3.87–5.11)
RDW: 15 % (ref 11.5–15.5)
WBC: 10 10*3/uL (ref 4.0–10.5)
nRBC: 0 % (ref 0.0–0.2)

## 2022-10-06 LAB — BASIC METABOLIC PANEL
Anion gap: 7 (ref 5–15)
BUN: 21 mg/dL (ref 8–23)
CO2: 27 mmol/L (ref 22–32)
Calcium: 8.9 mg/dL (ref 8.9–10.3)
Chloride: 103 mmol/L (ref 98–111)
Creatinine, Ser: 0.85 mg/dL (ref 0.44–1.00)
GFR, Estimated: >60 mL/min (ref >60)
Glucose, Bld: 87 mg/dL (ref 70–99)
Potassium: 3.5 mmol/L (ref 3.5–5.1)
Sodium: 137 mmol/L (ref 135–145)

## 2022-10-06 LAB — GLUCOSE, CAPILLARY
Glucose-Capillary: 84 mg/dL (ref 70–99)
Glucose-Capillary: 134 mg/dL — ABNORMAL HIGH (ref 70–99)
Glucose-Capillary: 261 mg/dL — ABNORMAL HIGH (ref 70–99)
Glucose-Capillary: 172 mg/dL — ABNORMAL HIGH (ref 70–99)
Glucose-Capillary: 148 mg/dL — ABNORMAL HIGH (ref 70–99)

## 2022-10-06 LAB — HEMOGLOBIN AND HEMATOCRIT, BLOOD
HCT: 26.1 % — ABNORMAL LOW (ref 36.0–46.0)
Hemoglobin: 8.4 g/dL — ABNORMAL LOW (ref 12.0–15.0)

## 2022-10-06 LAB — TRIGLYCERIDES: Triglycerides: 57 mg/dL (ref <150)

## 2022-10-06 MED ORDER — PANTOPRAZOLE SODIUM 40 MG PO TBEC
40.0000 mg | DELAYED_RELEASE_TABLET | Freq: Two times a day (BID) | ORAL | Status: DC
  Administered 2022-10-06 – 2022-10-08 (×4): 40 mg via ORAL
  Filled 2022-10-06 (×4): qty 1

## 2022-10-06 NOTE — TOC Progression Note (Signed)
Transition of Care [facility]) - Progression Note    Patient Details  Name: FRANCY MCILVAINE MRN: [id number] Date of Birth: 1935-03-26  Transition of Care [facility]) CM/SW Contact  [name], LCSWA Phone Number: 10/06/2022, 4:46 PM  Clinical Narrative:     CSW spoke with Graceanne with [facility] who confirmed they can accept patient when medically ready for dc. CSW will continue to follow and assist with patients dc planning needs.  Expected Discharge Plan: Home w Home Health Services Barriers to Discharge: Continued Medical Work up  Expected Discharge Plan and Services Expected Discharge Plan: Home w Home Health Services In-house Referral: Clinical Social Work     Living arrangements for the past 2 months: Independent Living Facility Administrator, arts)                                       Social Determinants of Health (SDOH) Interventions    Readmission Risk Interventions     No data to display

## 2022-10-06 NOTE — Progress Notes (Signed)
Mobility Specialist - Progress Note   10/06/22 1032  Mobility  Activity Ambulated with assistance in room  Level of Assistance Contact guard assist, steadying assist  Assistive Device Other (Comment) (hha)  Distance Ambulated (ft) 10 ft  Activity Response Tolerated well  Mobility Referral Yes  $Mobility charge 1 Mobility   Pt was received in chair and requesting assistance to [facility]. Pt c/o feeling some SOB during session. Pt had successful void. Pt was returned to chair with all needs met.  Franki Monte  Mobility Specialist Please contact via Solicitor or Rehab office at [phone]

## 2022-10-06 NOTE — Progress Notes (Signed)
Mobility Specialist - Progress Note   10/06/22 1638  Mobility  Activity Ambulated with assistance in hallway  Level of Assistance Standby assist, set-up cues, supervision of patient - no hands on  Assistive Device Front wheel walker  Distance Ambulated (ft) 240 ft  Activity Response Tolerated well  Mobility Referral Yes  $Mobility charge 1 Mobility   Pt received in chair and agreeable. No complaints throughout. Pt requested to use BR and had successful BM. Pt was left in bed with all needs met.  Franki Monte  Mobility Specialist Please contact via Solicitor or Rehab office at [phone]

## 2022-10-06 NOTE — Progress Notes (Signed)
Consultation Progress Note   Patient: Nicole [name] [id number]:[id number] DOB: 17-Apr-1935 DOA: 09/29/2022 DOS: the patient was seen and examined on 10/06/2022 Primary service: Sallye Lunz, Manfred Shirts, MD  Brief hospital course: Patient is a 87 year old female with no known past medical history on file presented to ED via EMS from her independent living facility with shortness of breath, bradycardia and emesis.  In ED, she was found to be hypoxic, bradycardic and hypotensive.  She was intubated and code sepsis was called.  Blood cultures obtained.  Patient was placed on IV fluids and antibiotics. Documentation was later sent with the patient as DNR. WBCs 28.8, hemoglobin 9.3, sodium 125, potassium 6.6, lactate 5.3, creatinine 1.92. VBG 7.27/37.03/15/16.3. Temp 92 F. CXR concerning for aspiration. Reports of coffee ground emesis. Scant dark ngt output and emesis on face.  Patient was admitted to the ICU by CCM. She was extubated on 12/12, weaned off of pressors, transferred to the floor  Assessment and Plan: Principal Problem: Acute metabolic encephalopathy -Possibly due to sepsis and metabolic derangements, may have underlying mild dementia -CT head negative for any acute intracranial abnormality -Mental status back to baseline, alert and oriented    Anemia in setting of blood thinner use -Hb dropped to 7.9 this morning, recheck Hb tonight   Active problems Acute respiratory failure with hypoxia suspected secondary to pneumonia versus aspiration, has underlying history of bronchiectasis -CT chest showed chronic indolent atypical infectious process such as MAI however, the possibility of neoplasm is not entirely excluded, recommended follow-up noncontrast chest CT in 3 months -Patient was extubated on 12/12. -On 10/14, noted to be more short of breath,  escalated O2 requirements to 5 L via [place], bibasilar [place], received IV Lasix 40 mg IV x 2, started on prednisone.  CTA positive for acute PE -Stable, O2  sats 96% on 2 L     Acute on chronic diastolic CHF  -CXR with bilateral pleural effusions, BNP 755.3, D-dimer 3.18. -Continue Lasix 40 mg IV daily x 2 doses, placed on oral Lasix 40 mg daily -2D echo 12/11 had shown EF of 55 to 60% with grade 3 DD -Negative of 1.4 L     Acute pulmonary embolism -CTA chest showed right lobe segmental pulmonary embolus with associated pulmonary infarct, minimal clot burden.  No right heart strain -2D echo 12/11 had shown EF of 55 to 60% with grade 3 DD, right ventricular systolic function normal, mild to moderate TR, moderate AS. -Venous Dopplers bilateral was negative for DVT -Transition to oral eliquis on 12/16   Bradycardia likely due to hypothermia and electrolyte abnormalities -On admission noted to be bradycardiac with heart rate in low 40s, hypothermia with temp of 92.1 F Heart rate now improved, continue metoprolol   Acute kidney injury, lactic acidosis -Presented with sodium of 125, potassium 6.6, creatinine of 1.92  -Baseline creatinine 1.3 on 06/27/2022.   -Lisinopril, HCTZ held, received IV fluid hydration. -Renal function has improved, back to baseline 0.8     Hyperkalemia, Hyponatremia -Potassium 6.6, sodium 125 on admission -Continue to hold HCTZ as patient is on Lasix -Potassium, sodium improved   Acute on chronic blood loss anemia, ?  Coffee-ground emesis -Hemoglobin 10.6 on 06/27/2022 -Continue PPI, H&H stable -Monitor with ongoing anticoagulation     Transaminitis AST 188, ALT 109, suspect secondary to shock -LFTs trending down, continue to hold statins     Essential hypertension -Continue beta-blocker, resumed lisinopril, continue Lasix 40 mg daily  -Continue to hold verapamil, HCTZ   Left  hand pain, right shoulder pain -Hand x-ray showed moderate thumb carpometacarpal osteoarthritis, mild to moderate interphalangeal osteoarthritis diffusely throughout the first through fifth digits -Right shoulder x-ray showed moderate  to severe glenohumeral osteoarthritis, mild acromioclavicular osteoarthritis -CRP, ESR, uric acid within normal limits -Tramadol for moderate to severe pain as needed   Hyperlipidemia -Hold statin due to transaminitis   Hypothyroidism -TSH 3.4, continue Synthroid   Moderate protein calorie malnutrition Etiology: social / environmental circumstances (inadequate oral intake, decreasing appetite) Signs/Symptoms: mild fat depletion, mild muscle depletion, moderate muscle depletion Estimated body mass index is 18.67 kg/m as calculated from the following:   Height as of this encounter: _0  (1.575 m).   Weight as of this encounter: 46.3 kg.   Code Status: DNR DVT Prophylaxis:  heparin bolus via infusion 1,000 Units Start: 10/04/22 0330 SCDs Start: 09/29/22 0340 apixaban (ELIQUIS) tablet 10 mg  apixaban (ELIQUIS) tablet 5 mg    Level of Care: Level of care: Telemetry Medical Family Communication: Updated patient's daughter, [name] on the phone on 12/15.   Disposition Plan:      Remains inpatient appropriate: Patient stable, hopefully DC to SNF in a.m.   Procedures:  Intubation, extubation 2D echo   Consultants:   Patient was admitted by CCM   Antimicrobials:             [facility] will continue to follow the patient.  Subjective: No complaints this morning  Physical Exam: Vitals:   10/05/22 1934 10/06/22 0359 10/06/22 0736 10/06/22 0829  BP:  (!) 157/71  (!) 177/87  Pulse:  80 76 81  Resp:  18 16   Temp:  98 F (36.7 C)    TempSrc:  Oral    SpO2: 97% 97% 97%   Weight:  47.7 kg    Height:       General: Alert and oriented x 3, NAD Cardiovascular: S1 S2 clear, RRR.  Respiratory: Scattered rhonchi improving Gastrointestinal: Soft, nontender, nondistended, NBS Ext: no pedal edema bilaterally Neuro: no new deficits, ambulating in the room Psych: Normal affect, pleasant Data Reviewed:  There are no new results to review at this time.  Family Communication:  None at bedside  Time spent: 15 minutes.  Author: Cristela Felt, MD 10/06/2022 11:23 AM  For on call review [email or web address].

## 2022-10-06 NOTE — Care Management Important Message (Signed)
Important Message  Patient Details  Name: Nicole [name] MRN: [id number] Date of Birth: 1935/09/21   Medicare Important Message Given:  Yes     Doristine, Shehan 10/06/2022, 11:26 AM

## 2022-10-06 NOTE — TOC Benefit Eligibility Note (Signed)
Patient Product/process development scientist completed.    The patient is currently admitted and upon discharge could be taking Eliquis 5 mg.  The current 30 day co-pay is $38.00.   The patient is insured through [employer]   Roland Earl, CPHT Pharmacy Patient Advocate Specialist [facility] Patient Advocate Team Direct Number: [phone]  Fax: [phone]

## 2022-10-07 LAB — CBC
HCT: 23.7 % — ABNORMAL LOW (ref 36.0–46.0)
Hemoglobin: 7.8 g/dL — ABNORMAL LOW (ref 12.0–15.0)
MCH: 28.5 pg (ref 26.0–34.0)
MCHC: 32.9 g/dL (ref 30.0–36.0)
MCV: 86.5 fL (ref 80.0–100.0)
Platelets: 283 10*3/uL (ref 150–400)
RBC: 2.74 MIL/uL — ABNORMAL LOW (ref 3.87–5.11)
RDW: 14.7 % (ref 11.5–15.5)
WBC: 9.9 10*3/uL (ref 4.0–10.5)
nRBC: 0 % (ref 0.0–0.2)

## 2022-10-07 LAB — FERRITIN: Ferritin: 21 ng/mL (ref 11–307)

## 2022-10-07 LAB — IRON AND TIBC
Iron: 28 ug/dL (ref 28–170)
Saturation Ratios: 8 % — ABNORMAL LOW (ref 10.4–31.8)
TIBC: 360 ug/dL (ref 250–450)
UIBC: 332 ug/dL

## 2022-10-07 LAB — GLUCOSE, CAPILLARY
Glucose-Capillary: 100 mg/dL — ABNORMAL HIGH (ref 70–99)
Glucose-Capillary: 130 mg/dL — ABNORMAL HIGH (ref 70–99)
Glucose-Capillary: 161 mg/dL — ABNORMAL HIGH (ref 70–99)
Glucose-Capillary: 194 mg/dL — ABNORMAL HIGH (ref 70–99)
Glucose-Capillary: 117 mg/dL — ABNORMAL HIGH (ref 70–99)

## 2022-10-07 LAB — HEMOGLOBIN AND HEMATOCRIT, BLOOD
HCT: 27.3 % — ABNORMAL LOW (ref 36.0–46.0)
Hemoglobin: 8.8 g/dL — ABNORMAL LOW (ref 12.0–15.0)

## 2022-10-07 NOTE — Plan of Care (Signed)
Problem: Education: Goal: Knowledge of General Education information will improve Description: Including pain rating scale, medication(s)/side effects and non-pharmacologic comfort measures 10/07/2022 2044 by Robley Fries, RN Outcome: Progressing 10/07/2022 2044 by Robley Fries, RN Outcome: Progressing   Problem: Health Behavior/Discharge Planning: Goal: Ability to manage health-related needs will improve 10/07/2022 2044 by Robley Fries, RN Outcome: Progressing 10/07/2022 2044 by Robley Fries, RN Outcome: Progressing   Problem: Clinical Measurements: Goal: Ability to maintain clinical measurements within normal limits will improve 10/07/2022 2044 by Robley Fries, RN Outcome: Progressing 10/07/2022 2044 by Robley Fries, RN Outcome: Progressing Goal: Will remain free from infection 10/07/2022 2044 by Robley Fries, RN Outcome: Progressing 10/07/2022 2044 by Robley Fries, RN Outcome: Progressing Goal: Diagnostic test results will improve 10/07/2022 2044 by Robley Fries, RN Outcome: Progressing 10/07/2022 2044 by Robley Fries, RN Outcome: Progressing Goal: Respiratory complications will improve 10/07/2022 2044 by Robley Fries, RN Outcome: Progressing 10/07/2022 2044 by Robley Fries, RN Outcome: Progressing Goal: Cardiovascular complication will be avoided 10/07/2022 2044 by Robley Fries, RN Outcome: Progressing 10/07/2022 2044 by Robley Fries, RN Outcome: Progressing   Problem: Activity: Goal: Risk for activity intolerance will decrease 10/07/2022 2044 by Robley Fries, RN Outcome: Progressing 10/07/2022 2044 by Robley Fries, RN Outcome: Progressing   Problem: Nutrition: Goal: Adequate nutrition will be maintained 10/07/2022 2044 by Robley Fries, RN Outcome: Progressing 10/07/2022 2044 by Robley Fries, RN Outcome: Progressing   Problem: Coping: Goal: Level of anxiety will decrease 10/07/2022  2044 by Robley Fries, RN Outcome: Progressing 10/07/2022 2044 by Robley Fries, RN Outcome: Progressing   Problem: Elimination: Goal: Will not experience complications related to bowel motility 10/07/2022 2044 by Robley Fries, RN Outcome: Progressing 10/07/2022 2044 by Robley Fries, RN Outcome: Progressing Goal: Will not experience complications related to urinary retention 10/07/2022 2044 by Robley Fries, RN Outcome: Progressing 10/07/2022 2044 by Robley Fries, RN Outcome: Progressing   Problem: Pain Managment: Goal: General experience of comfort will improve 10/07/2022 2044 by Robley Fries, RN Outcome: Progressing 10/07/2022 2044 by Robley Fries, RN Outcome: Progressing   Problem: Safety: Goal: Ability to remain free from injury will improve 10/07/2022 2044 by Robley Fries, RN Outcome: Progressing 10/07/2022 2044 by Robley Fries, RN Outcome: Progressing   Problem: Skin Integrity: Goal: Risk for impaired skin integrity will decrease 10/07/2022 2044 by Robley Fries, RN Outcome: Progressing 10/07/2022 2044 by Robley Fries, RN Outcome: Progressing   Problem: Education: Goal: Ability to describe self-care measures that may prevent or decrease complications (Diabetes Survival Skills Education) will improve 10/07/2022 2044 by Robley Fries, RN Outcome: Progressing 10/07/2022 2044 by Robley Fries, RN Outcome: Progressing Goal: Individualized Educational Video(s) 10/07/2022 2044 by Robley Fries, RN Outcome: Progressing 10/07/2022 2044 by Robley Fries, RN Outcome: Progressing   Problem: Coping: Goal: Ability to adjust to condition or change in health will improve 10/07/2022 2044 by Robley Fries, RN Outcome: Progressing 10/07/2022 2044 by Robley Fries, RN Outcome: Progressing   Problem: Fluid Volume: Goal: Ability to maintain a balanced intake and output will improve 10/07/2022 2044 by Robley Fries, RN Outcome: Progressing 10/07/2022 2044 by Robley Fries, RN Outcome: Progressing   Problem: Health Behavior/Discharge Planning: Goal: Ability to identify and utilize available resources and services will improve 10/07/2022 2044 by Robley Fries, RN Outcome: Progressing 10/07/2022 2044 by Robley Fries,  RN Outcome: Progressing Goal: Ability to manage health-related needs will improve 10/07/2022 2044 by Royetta Crochet, RN Outcome: Progressing 10/07/2022 2044 by Royetta Crochet, RN Outcome: Progressing   Problem: Metabolic: Goal: Ability to maintain appropriate glucose levels will improve 10/07/2022 2044 by Royetta Crochet, RN Outcome: Progressing 10/07/2022 2044 by Royetta Crochet, RN Outcome: Progressing   Problem: Nutritional: Goal: Maintenance of adequate nutrition will improve 10/07/2022 2044 by Royetta Crochet, RN Outcome: Progressing 10/07/2022 2044 by Royetta Crochet, RN Outcome: Progressing Goal: Progress toward achieving an optimal weight will improve 10/07/2022 2044 by Royetta Crochet, RN Outcome: Progressing 10/07/2022 2044 by Royetta Crochet, RN Outcome: Progressing   Problem: Skin Integrity: Goal: Risk for impaired skin integrity will decrease 10/07/2022 2044 by Royetta Crochet, RN Outcome: Progressing 10/07/2022 2044 by Royetta Crochet, RN Outcome: Progressing   Problem: Tissue Perfusion: Goal: Adequacy of tissue perfusion will improve 10/07/2022 2044 by Royetta Crochet, RN Outcome: Progressing 10/07/2022 2044 by Royetta Crochet, RN Outcome: Progressing   Problem: Safety: Goal: Non-violent Restraint(s) 10/07/2022 2044 by Royetta Crochet, RN Outcome: Progressing 10/07/2022 2044 by Royetta Crochet, RN Outcome: Progressing

## 2022-10-07 NOTE — Progress Notes (Signed)
Mobility Specialist - Progress Note   10/07/22 1605  Mobility  Activity Ambulated with assistance in room  Level of Assistance Standby assist, set-up cues, supervision of patient - no hands on  Assistive Device Front wheel walker  Distance Ambulated (ft) 20 ft  Activity Response Tolerated well  Mobility Referral Yes  $Mobility charge 1 Mobility   Pt was received in BR and requesting assistance back to chair. No complaints throughout. Pt was left in chair with all needs met.   Franki Monte  Mobility Specialist Please contact via Solicitor or Rehab office at [phone]

## 2022-10-07 NOTE — Progress Notes (Signed)
Occupational Therapy Treatment Patient Details Name: Nicole [name] MRN: [id number] DOB: 01/16/1935 Today's Date: 10/07/2022   History of present illness Pt is an 87 y.o. female admitted from [facility] with SOB, bradycardia, emesis. CXR concerning for aspiration. Workup for acute respiratory failure and metabolic encephalopathy secondary to sepsis, suspected RLL aspiration PNA. ETT 12/11-12/12. PMH includes recent admission for fall 06/2022 sustaining scalp hematoma, otherwise no known PMH.   OT comments  Nicole [name] is making great progress and remains eager to participate. Pt reports that she is feeling much bette this date but continues to get a bit "anxious" when she becomes SOB. Overall she required generalized supervision A for >household distance mobility with RW and all transfers, she did not require a rest break. Pt received just after completing ADLs with NT, per report she completed them all with supervision A. OT to continue to follow acutely. If pt had 24/7 assist, she could d/c home however per daughters report, they are unable to provide increased assist. Therefore, SNF continues to be appropriate.    Recommendations for follow up therapy are one component of a multi-disciplinary discharge planning process, led by the attending physician.  Recommendations may be updated based on patient status, additional functional criteria and insurance authorization.    Follow Up Recommendations  Skilled nursing-short term rehab (<3 hours/day)     Assistance Recommended at Discharge Frequent or constant Supervision/Assistance  Patient can return home with the following  A little help with walking and/or transfers;A lot of help with bathing/dressing/bathroom   Equipment Recommendations  Other (comment)       Precautions / Restrictions Precautions Precautions: Fall Restrictions Weight Bearing Restrictions: No       Mobility Bed Mobility Overal bed mobility: Needs Assistance Bed Mobility: Supine  to Sit     Supine to sit: Supervision          Transfers Overall transfer level: Needs assistance Equipment used: Rolling walker (2 wheels) Transfers: Sit to/from Stand Sit to Stand: Supervision                 Balance Overall balance assessment: Needs assistance Sitting-balance support: No upper extremity supported, Feet supported Sitting balance-Leahy Scale: Fair     Standing balance support: No upper extremity supported, During functional activity Standing balance-Leahy Scale: Fair Standing balance comment: statically                           ADL either performed or assessed with clinical judgement   ADL Overall ADL's : Needs assistance/impaired                                     Functional mobility during ADLs: Supervision/safety;Rolling walker (2 wheels) General ADL Comments: pt received right after toileting and grooming with NT. Per report she completed with supervision A    Extremity/Trunk Assessment Upper Extremity Assessment Upper Extremity Assessment: RUE deficits/detail;LUE deficits/detail RUE Deficits / Details: Shoulder AROM 90*, 150* PROM, L hand with mild, dupuytren's contracture RUE Coordination: decreased fine motor;decreased gross motor LUE Deficits / Details: shoulder flexion WNL, dupuytren's contracture, decreased grasp   Lower Extremity Assessment Lower Extremity Assessment: Defer to PT evaluation        Vision   Additional Comments: disconjugate gaze at baseline - vision is overall WFL. noted to undershoot throughout session   Perception Perception Perception: Not tested   Praxis Praxis Praxis: Not  tested    Cognition Arousal/Alertness: Awake/alert Behavior During Therapy: WFL for tasks assessed/performed Overall Cognitive Status: Within Functional Limits for tasks assessed                                 General Comments: verbose but pleasant and WFL. pt reports she gets a little  anxious about her breathing sometimes              General Comments VSS on RA    Pertinent Vitals/ Pain       Pain Assessment Pain Assessment: Faces Faces Pain Scale: No hurt Pain Intervention(s): Monitored during session   Frequency  Min 2X/week        Progress Toward Goals  OT Goals(current goals can now be found in the care plan section)  Progress towards OT goals: Progressing toward goals  Acute Rehab OT Goals Patient Stated Goal: to go to rehab for 3 days and then go home OT Goal Formulation: With patient Time For Goal Achievement: 10/15/22 Potential to Achieve Goals: Good ADL Goals Pt Will Perform Upper Body Dressing: with supervision;sitting Pt Will Perform Lower Body Dressing: with min assist;sit to/from stand;sitting/lateral leans Pt Will Transfer to Toilet: with supervision;ambulating;regular height toilet Pt Will Perform Tub/Shower Transfer: Tub transfer;Shower transfer;with supervision;ambulating;rolling walker  Plan Discharge plan remains appropriate       AM-PAC OT "6 Clicks" Daily Activity     Outcome Measure   Help from another person eating meals?: None Help from another person taking care of personal grooming?: A Little Help from another person toileting, which includes using toliet, bedpan, or urinal?: A Little Help from another person bathing (including washing, rinsing, drying)?: A Little Help from another person to put on and taking off regular upper body clothing?: A Little Help from another person to put on and taking off regular lower body clothing?: A Little 6 Click Score: 19    End of Session Equipment Utilized During Treatment: Rolling walker (2 wheels)  OT Visit Diagnosis: Unsteadiness on feet (R26.81);Other abnormalities of gait and mobility (R26.89);Muscle weakness (generalized) (M62.81)   Activity Tolerance Patient tolerated treatment well   Patient Left in chair;with call bell/phone within reach;with chair alarm set   Nurse  Communication Mobility status        Time: 7846-9629 OT Time Calculation (min): 23 min  Charges: OT General Charges $OT Visit: 1 Visit OT Treatments $Therapeutic Activity: 23-37 mins   Donia Pounds 10/07/2022, 4:20 PM

## 2022-10-07 NOTE — NC FL2 (Signed)
[place] MEDICAID FL2 LEVEL OF CARE FORM     IDENTIFICATION  Patient Name: Nicole [name] Birthdate: Mar 03, 1935 Sex: female Admission Date (Current Location): 09/29/2022  [facility] and [place] Number:  Producer, television/film/video and Address:  [facility] [place], [address] [place], [place] [address]      Provider Number: [id number]  Attending Physician Name and Address:  Dibia, Elgie Collard, MD  Relative Name and Phone Number:  Selena Batten (Daughter) [phone]    Current Level of Care: Hospital Recommended Level of Care: Skilled Nursing Facility Prior Approval Number:    Date Approved/Denied:   PASRR Number: [id number]  Discharge Plan: SNF    Current Diagnoses: Patient Active Problem List   Diagnosis Date Noted   Malnutrition of moderate degree 10/01/2022   Hyponatremia 09/30/2022   Septic shock (HCC) 09/29/2022   Bradycardia 09/29/2022   Acute respiratory failure with hypoxia (HCC) 09/29/2022   Hematemesis 09/29/2022   AKI (acute kidney injury) (HCC) 09/29/2022   Hyperkalemia 09/29/2022   Pneumonia due to infectious organism 09/29/2022   Acute respiratory failure (HCC) 09/29/2022   Advance care planning 09/29/2022    Orientation RESPIRATION BLADDER Height & Weight     Self, Time, Situation, Place  Normal Continent Weight: 101 lb 12.8 oz (46.2 kg) Height:  5\' 2"  (157.5 cm)  BEHAVIORAL SYMPTOMS/MOOD NEUROLOGICAL BOWEL NUTRITION STATUS      Continent Diet (Please see discharge summary)  AMBULATORY STATUS COMMUNICATION OF NEEDS Skin   Limited Assist Verbally Other (Comment) (WDL,Wound/Incision LDAs)                       Personal Care Assistance Level of Assistance  Bathing, Feeding, Dressing Bathing Assistance: Limited assistance Feeding assistance: Independent Dressing Assistance: Limited assistance     Functional Limitations Info  Sight, Hearing, Speech Sight Info: Impaired Hearing Info: Adequate Speech Info: Adequate    SPECIAL CARE  FACTORS FREQUENCY  PT (By licensed PT), OT (By licensed OT)     PT Frequency: 5x min weekly OT Frequency: 5x min weekly            Contractures Contractures Info: Not present    Additional Factors Info  Code Status, Allergies, Insulin Sliding Scale Code Status Info: DNR Allergies Info: Codeine,Penicillins   Insulin Sliding Scale Info: insulin aspart (novoLOG) injection 0-5 Units daily at bedtime,insulin aspart (novoLOG) injection 0-9 Units 3 times daily with meals       Current Medications (10/07/2022):  This is the current hospital active medication list Current Facility-Administered Medications  Medication Dose Route Frequency Provider Last Rate Last Admin   acetaminophen (TYLENOL) tablet 650 mg  650 mg Oral Q6H PRN Rai, Ripudeep K, MD   650 mg at 10/01/22 2059   apixaban (ELIQUIS) tablet 10 mg  10 mg Oral BID 2060, RPH   10 mg at 10/07/22 10/09/22   Followed by   8469 ON 10/11/2022] apixaban (ELIQUIS) tablet 5 mg  5 mg Oral BID 10/13/2022, RPH       docusate sodium (COLACE) capsule 100 mg  100 mg Oral BID PRN Rai, Ripudeep K, MD       feeding supplement (ENSURE ENLIVE / ENSURE PLUS) liquid 237 mL  237 mL Oral BID BM Rai, Ripudeep K, MD   237 mL at 10/07/22 1304   furosemide (LASIX) tablet 40 mg  40 mg Oral Daily Rai, Ripudeep K, MD   40 mg at 10/07/22 0849   hydrALAZINE (APRESOLINE) injection  10-40 mg  10-40 mg Intravenous Q4H PRN Lidia Collum, PA-C   20 mg at 10/01/22 3704   insulin aspart (novoLOG) injection 0-5 Units  0-5 Units Subcutaneous QHS Rai, Ripudeep K, MD   2 Units at 10/03/22 2118   insulin aspart (novoLOG) injection 0-9 Units  0-9 Units Subcutaneous TID WC Rai, Ripudeep K, MD   1 Units at 10/07/22 1302   levalbuterol (XOPENEX) nebulizer solution 0.63 mg  0.63 mg Nebulization Q6H PRN Rai, Ripudeep K, MD       levothyroxine (SYNTHROID) tablet 75 mcg  75 mcg Oral Q0600 Lidia Collum, PA-C   75 mcg at 10/07/22 8889   lisinopril (ZESTRIL) tablet 20 mg  20  mg Oral Daily Rai, Ripudeep K, MD   20 mg at 10/07/22 0849   metoprolol tartrate (LOPRESSOR) tablet 25 mg  25 mg Oral BID Rai, Ripudeep K, MD   25 mg at 10/07/22 0849   mometasone-formoterol (DULERA) 200-5 MCG/ACT inhaler 2 puff  2 puff Inhalation BID Pia Mau D, PA-C   2 puff at 10/07/22 0736   montelukast (SINGULAIR) tablet 10 mg  10 mg Oral Daily Lidia Collum, PA-C   10 mg at 10/07/22 1694   Oral care mouth rinse  15 mL Mouth Rinse PRN Lorin Glass, MD       pantoprazole (PROTONIX) EC tablet 40 mg  40 mg Oral BID Dibia, Elgie Collard, MD   40 mg at 10/07/22 0849   polyethylene glycol (MIRALAX / GLYCOLAX) packet 17 g  17 g Oral Daily PRN Rai, Ripudeep K, MD       sodium chloride (OCEAN) 0.65 % nasal spray 1 spray  1 spray Each Nare PRN Crosley, Debby, MD       traMADol (ULTRAM) tablet 50 mg  50 mg Oral Q8H PRN Rai, Ripudeep K, MD       umeclidinium bromide (INCRUSE ELLIPTA) 62.5 MCG/ACT 1 puff  1 puff Inhalation Daily Lorin Glass, MD   1 puff at 10/07/22 5038     Discharge Medications: Please see discharge summary for a list of discharge medications.  Relevant Imaging Results:  Relevant Lab Results:   Additional Information [id number]  [name], LCSWA

## 2022-10-07 NOTE — Progress Notes (Signed)
Consultation Progress Note   Patient: Nicole [name] [id number] DOB: July 16, 1935 DOA: 09/29/2022 DOS: the patient was seen and examined on 10/07/2022 Primary service: Amantha Sklar, Manfred Shirts, MD  Brief hospital course: 87 year old female with no known past medical history on file presented to ED via EMS from her independent living facility with shortness of breath, bradycardia and emesis.  In ED, she was found to be hypoxic, bradycardic and hypotensive.  She was intubated and code sepsis was called.  Blood cultures obtained.  Patient was placed on IV fluids and antibiotics. Documentation was later sent with the patient as DNR. WBCs 28.8, hemoglobin 9.3, sodium 125, potassium 6.6, lactate 5.3, creatinine 1.92. VBG 7.27/37.03/15/16.3. Temp 92 F. CXR concerning for aspiration. Reports of coffee ground emesis. Scant dark ngt output and emesis on face.  Patient was admitted to the ICU by CCM. She was extubated on 12/12, weaned off of pressors, transferred to the floor  Assessment and Plan: Principal Problem: Acute metabolic encephalopathy -Possibly due to sepsis and metabolic derangements, may have underlying mild dementia -CT head negative for any acute intracranial abnormality -Mental status back to baseline, alert and oriented     Anemia in setting of blood thinner use -Monitor trend while on blood thinners.  Check TIBC, Iron, Ferritin   Active problems Acute respiratory failure with hypoxia suspected secondary to pneumonia versus aspiration, has underlying history of bronchiectasis -CT chest showed chronic indolent atypical infectious process such as MAI however, the possibility of neoplasm is not entirely excluded, recommended follow-up noncontrast chest CT in 3 months -Patient was extubated on 12/12. -On 10/14, noted to be more short of breath,  escalated O2 requirements to 5 L via [place], bibasilar [place], received IV Lasix 40 mg IV x 2, started on prednisone.  CTA positive for acute  PE -Stable, O2 sats 96% on 2 L     Acute on chronic diastolic CHF  -CXR with bilateral pleural effusions, BNP 755.3, D-dimer 3.18. -Continue Lasix 40 mg IV daily x 2 doses, placed on oral Lasix 40 mg daily -2D echo 12/11 had shown EF of 55 to 60% with grade 3 DD -Negative of 1.4 L     Acute pulmonary embolism -CTA chest showed right lobe segmental pulmonary embolus with associated pulmonary infarct, minimal clot burden.  No right heart strain -2D echo 12/11 had shown EF of 55 to 60% with grade 3 DD, right ventricular systolic function normal, mild to moderate TR, moderate AS. -Venous Dopplers bilateral was negative for DVT -Transition to oral eliquis on 12/16   Bradycardia likely due to hypothermia and electrolyte abnormalities -On admission noted to be bradycardiac with heart rate in low 40s, hypothermia with temp of 92.1 F Heart rate now improved, continue metoprolol   Acute kidney injury, lactic acidosis -Presented with sodium of 125, potassium 6.6, creatinine of 1.92  -Baseline creatinine 1.3 on 06/27/2022.   -Lisinopril, HCTZ held, received IV fluid hydration. -Renal function has improved, back to baseline 0.8     Hyperkalemia, Hyponatremia -Potassium 6.6, sodium 125 on admission -Continue to hold HCTZ as patient is on Lasix -Potassium, sodium improved   Acute on chronic blood loss anemia, ?  Coffee-ground emesis -Hemoglobin dropped to 7.9, monitor trend, check TIBC, IRON  -Continue PPI, H&H stable -Monitor with ongoing anticoagulation     Transaminitis AST 188, ALT 109, suspect secondary to shock -LFTs trending down, continue to hold statins     Essential hypertension -Continue beta-blocker, resumed lisinopril, continue Lasix 40 mg daily  -Continue to  hold verapamil, HCTZ   Left hand pain, right shoulder pain -Hand x-ray showed moderate thumb carpometacarpal osteoarthritis, mild to moderate interphalangeal osteoarthritis diffusely throughout the first through  fifth digits -Right shoulder x-ray showed 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   Weight as of this encounter: 46.3 kg.   Code Status: DNR DVT Prophylaxis:  heparin bolus via infusion 1,000 Units Start: 10/04/22 0330 SCDs Start: 09/29/22 0340 apixaban (ELIQUIS) tablet 10 mg  apixaban (ELIQUIS) tablet 5 mg    Level of Care: Level of care: Telemetry Medical Family Communication: Updated patient's daughter, [name] on the phone on 12/15.   Disposition Plan:     Procedures:  Intubation, extubation 2D echo   Consultants:   Patient was admitted by CCM       TRH will continue to follow the patient.  Subjective: No acute complaints. Feels weak. Monitoring Hb due to drop noted.   Physical Exam: Vitals:   10/06/22 2031 10/07/22 0501 10/07/22 0736 10/07/22 0842  BP: (!) 143/70 (!) 166/70  (!) 155/83  Pulse: 72 78 82 77  Resp: _1 Temp: 97.7 F (36.5 C) 98.1 F (36.7 C)  97.7 F (36.5 C)  TempSrc: Oral Oral  Oral  SpO2: 96% 96% 95% 97%  Weight:  46.2 kg    Height:      General: Alert and oriented x 3, NAD Cardiovascular: S1 S2 clear, RRR.  Respiratory: Diminished breath sounds Gastrointestinal: Soft, nontender, nondistended, NBS Ext: no pedal edema bilaterally Neuro: no new deficits, ambulating in the room Psych: Normal affect, pleasant  Data Reviewed:  There are no new results to review  at this time.  Family Communication:   Time spent: 15 minutes.  Author: Cristela Felt, MD 10/07/2022 12:35 PM  For on call review [email or web address].

## 2022-10-07 NOTE — TOC Progression Note (Signed)
Transition of Care [facility]) - Progression Note    Patient Details  Name: Nicole [name] MRN: [id number] Date of Birth: 11/06/1934  Transition of [facility]) CM/SW Contact  [name], LCSWA Phone Number: 10/07/2022, 2:16 PM  Clinical Narrative:     Patient has SNF bed at [facility] when medically ready for dc. CSW will continue to follow and assist with patients dc planning needs.   Expected Discharge Plan: Home w Home Health Services Barriers to Discharge: Continued Medical Work up  Expected Discharge Plan and Services Expected Discharge Plan: Home w Home Health Services In-house Referral: Clinical Social Work     Living arrangements for the past 2 months: Independent Living Facility Administrator, arts)                                       Social Determinants of Health (SDOH) Interventions    Readmission Risk Interventions     No data to display

## 2022-10-08 LAB — BASIC METABOLIC PANEL
Anion gap: 10 (ref 5–15)
BUN: 19 mg/dL (ref 8–23)
CO2: 26 mmol/L (ref 22–32)
Calcium: 8.8 mg/dL — ABNORMAL LOW (ref 8.9–10.3)
Chloride: 105 mmol/L (ref 98–111)
Creatinine, Ser: 0.8 mg/dL (ref 0.44–1.00)
GFR, Estimated: >60 mL/min (ref >60)
Glucose, Bld: 95 mg/dL (ref 70–99)
Potassium: 3 mmol/L — ABNORMAL LOW (ref 3.5–5.1)
Sodium: 141 mmol/L (ref 135–145)

## 2022-10-08 LAB — GLUCOSE, CAPILLARY
Glucose-Capillary: 80 mg/dL (ref 70–99)
Glucose-Capillary: 88 mg/dL (ref 70–99)

## 2022-10-08 LAB — CBC
HCT: 25.6 % — ABNORMAL LOW (ref 36.0–46.0)
Hemoglobin: 8.3 g/dL — ABNORMAL LOW (ref 12.0–15.0)
MCH: 28.5 pg (ref 26.0–34.0)
MCHC: 32.4 g/dL (ref 30.0–36.0)
MCV: 88 fL (ref 80.0–100.0)
Platelets: 296 10*3/uL (ref 150–400)
RBC: 2.91 MIL/uL — ABNORMAL LOW (ref 3.87–5.11)
RDW: 14.8 % (ref 11.5–15.5)
WBC: 11.4 10*3/uL — ABNORMAL HIGH (ref 4.0–10.5)
nRBC: 0 % (ref 0.0–0.2)

## 2022-10-08 MED ORDER — PANTOPRAZOLE SODIUM 40 MG PO TBEC: 40.0000 mg | DELAYED_RELEASE_TABLET | Freq: Two times a day (BID) | ORAL | 0 refills | Status: DC

## 2022-10-08 MED ORDER — APIXABAN 5 MG PO TABS: 5.0000 mg | ORAL_TABLET | Freq: Two times a day (BID) | ORAL | 0 refills | Status: DC

## 2022-10-08 MED ORDER — APIXABAN 5 MG PO TABS: 10.0000 mg | ORAL_TABLET | Freq: Two times a day (BID) | ORAL | 0 refills | Status: DC

## 2022-10-08 NOTE — Progress Notes (Signed)
RN called the report to [employer]. All questions answered.

## 2022-10-08 NOTE — Discharge Summary (Signed)
Physician Discharge Summary   Patient: Nicole [name] MRN: [id number] DOB: 11-Oct-1935  Admit date:     09/29/2022  Discharge date: 10/08/22  Discharge Physician: Manfred Shirts Senay Sistrunk   PCP: Sherrie Sport, MD   Recommendations at discharge:    Follow up with PCP  Discharge Diagnoses: Principal Problem:   Septic shock ([place]) Active Problems:   Bradycardia   Acute respiratory failure with hypoxia (HCC)   Hematemesis   AKI (acute kidney injury) ([place])   Hyperkalemia   Pneumonia due to infectious organism   Acute respiratory failure (HCC)   Advance care planning   Hyponatremia   Malnutrition of moderate degree  Resolved Problems:   * No resolved hospital problems. *  Hospital Course: 87 year old female with no known past medical history on file presented to ED via EMS from her independent living facility with shortness of breath, bradycardia and emesis.  In ED, she was found to be hypoxic, bradycardic and hypotensive.  She was intubated and code sepsis was called.  Blood cultures obtained.  Patient was placed on IV fluids and antibiotics. Documentation was later sent with the patient as DNR. WBCs 28.8, hemoglobin 9.3, sodium 125, potassium 6.6, lactate 5.3, creatinine 1.92. VBG 7.27/37.03/15/16.3. Temp 92 F. CXR concerning for aspiration. Reports of coffee ground emesis. Scant dark ngt output and emesis on face.  Patient was admitted to the ICU by CCM. She was extubated on 12/12, weaned off of pressors, transferred to the floor    Assessment and Plan: Principal Problem: Acute metabolic encephalopathy -Possibly due to sepsis and metabolic derangements, may have underlying mild dementia -CT head negative for any acute intracranial abnormality -Mental status back to baseline, alert and oriented     Anemia in setting of blood thinner use -Monitor trend while on blood thinners.  Iron studies indicative of ACD   Active problems Acute respiratory failure with hypoxia suspected  secondary to pneumonia versus aspiration, has underlying history of bronchiectasis -CT chest showed chronic indolent atypical infectious process such as MAI however, the possibility of neoplasm is not entirely excluded, recommended follow-up noncontrast chest CT in 3 months -Patient was extubated on 12/12. -On 10/14, noted to be more short of breath,  escalated O2 requirements to 5 L via [place], bibasilar [place], received IV Lasix 40 mg IV x 2, started on prednisone.  CTA positive for acute PE -Stable, O2 sats 96% on 2 L     Acute on chronic diastolic CHF  -CXR with bilateral pleural effusions, BNP 755.3, D-dimer 3.18. -Continue Lasix 40 mg IV daily x 2 doses, placed on oral Lasix 40 mg daily -2D echo 12/11 had shown EF of 55 to 60% with grade 3 DD -Negative of 1.4 L Clinically stable     Acute pulmonary embolism -CTA chest showed right lobe segmental pulmonary embolus with associated pulmonary infarct, minimal clot burden.  No right heart strain -2D echo 12/11 had shown EF of 55 to 60% with grade 3 DD, right ventricular systolic function normal, mild to moderate TR, moderate AS. -Venous Dopplers bilateral was negative for DVT -Transition to oral eliquis on 12/16   Bradycardia likely due to hypothermia and electrolyte abnormalities -On admission noted to be bradycardiac with heart rate in low 40s, hypothermia with temp of 92.1 F Heart rate now improved, continue metoprolol   Acute kidney injury, lactic acidosis -Presented with sodium of 125, potassium 6.6, creatinine of 1.92  -Baseline creatinine 1.3 on 06/27/2022.   -Lisinopril, HCTZ held, received IV fluid hydration. -Renal function  has improved, back to baseline 0.8     Hyperkalemia, Hyponatremia -Potassium 6.6, sodium 125 on admission -Continue to hold HCTZ as patient is on Lasix -Potassium, sodium improved   Acute on chronic blood loss anemia, ?  Coffee-ground emesis -Hemoglobin dropped to 7.9, monitor trend, check TIBC, IRON   -Continue PPI, H&H stable -Monitor with ongoing anticoagulation     Transaminitis AST 188, ALT 109, suspect secondary to shock -LFTs trending down, Restart statins     Essential hypertension -Continue beta-blocker, resumed lisinopril, continue Lasix 40 mg daily  -Continue home meds   Left hand pain, right shoulder pain -Hand x-ray showed moderate thumb carpometacarpal osteoarthritis, mild to moderate interphalangeal osteoarthritis diffusely throughout the first through fifth digits -Right shoulder x-ray showed 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   Weight as of this encounter: 46.3 kg.   Code Status: DNR DVT Prophylaxis:  heparin bolus via infusion 1,000 Units Start: 10/04/22 0330 SCDs Start: 09/29/22 0340 apixaban (ELIQUIS) tablet 10 mg  apixaban (ELIQUIS) tablet 5 mg           Consultants:  Procedures performed:   Disposition: Skilled nursing facility Diet recommendation:  Cardiac diet DISCHARGE MEDICATION: Allergies as of 10/08/2022       Reactions   Codeine Nausea Only, Other (See Comments)   Dizziness    Penicillins Other (See Comments)   Unknown        Medication List     STOP taking these medications    cefdinir 300 MG capsule Commonly known as: OMNICEF   erythromycin 250 MG tablet Commonly known as: E-MYCIN       TAKE these medications    acetaminophen 325 MG tablet Commonly known as: TYLENOL Take 325 mg by  mouth as needed for moderate pain or headache.   albuterol 108 (90 Base) MCG/ACT inhaler Commonly known as: VENTOLIN HFA Inhale 2 puffs into the lungs every 4 (four) hours as needed for wheezing or shortness of breath.   apixaban 5 MG Tabs tablet Commonly known as: ELIQUIS Take 2 tablets (10 mg total) by mouth 2 (two) times daily.   apixaban 5 MG Tabs tablet Commonly known as: ELIQUIS Take 1 tablet (5 mg total) by mouth 2 (two) times daily. Start taking on: October 11, 2022   atorvastatin 20 MG tablet Commonly known as: LIPITOR Take 20 mg by mouth daily.   fluticasone-salmeterol 250-50 MCG/ACT Aepb Commonly known as: ADVAIR Inhale 1 puff into the lungs 2 (two) times daily.   latanoprost 0.005 % ophthalmic solution Commonly known as: XALATAN Place 1 drop into both eyes at bedtime.   levocetirizine 5 MG tablet Commonly known as: XYZAL Take 5 mg by mouth at bedtime.   levothyroxine 75 MCG tablet Commonly known as: SYNTHROID Take 75 mcg by mouth daily.   lisinopril-hydrochlorothiazide 20-12.5 MG tablet Commonly known as: ZESTORETIC Take 2 tablets by mouth every morning.   meclizine 25 MG tablet Commonly known as: ANTIVERT Take 12.5 mg by mouth daily as needed for dizziness.   metoprolol succinate 50 MG 24 hr tablet Commonly known as:  TOPROL-XL Take 50 mg by mouth at bedtime.   montelukast 10 MG tablet Commonly known as: SINGULAIR Take 10 mg by mouth daily.   pantoprazole 40 MG tablet Commonly known as: PROTONIX Take 1 tablet (40 mg total) by mouth 2 (two) times daily.   Spiriva HandiHaler 18 MCG inhalation capsule Generic drug: tiotropium Place 18 mcg into inhaler and inhale daily.   verapamil 180 MG CR tablet Commonly known as: CALAN-SR Take 180 mg by mouth at bedtime.        Follow-up [place], [facility] Follow up.   Why: Social Work, Physical and Occupational therapy-office to call with visit times. Contact  information: [place] [address] [phone]                Discharge Exam: Filed Weights   10/06/22 0359 10/07/22 0501 10/08/22 0400  Weight: 47.7 kg 46.2 kg 45.4 kg  General: Alert and oriented x 3, NAD Cardiovascular: S1 S2 clear, RRR.  Respiratory: Diminished breath sounds Gastrointestinal: Soft, nontender, nondistended, NBS Ext: no pedal edema bilaterally Neuro: no new deficits, ambulating in the room Psych: Normal affect, pleasant   Condition at discharge: good  The results of significant diagnostics from this hospitalization (including imaging, microbiology, ancillary and laboratory) are listed below for reference.   Imaging Studies: VAS [place] LOWER EXTREMITY VENOUS (DVT)  Result Date: 10/03/2022  Lower Venous DVT Study Patient Name:  CHAZ RONNING  Date of Exam:   10/03/2022 Medical Rec #: [id number]      Accession #:    [id number] Date of Birth: 09-May-1935      Patient Gender: F Patient Age:   87 years Exam Location:  [facility] Procedure:      VAS [place] LOWER EXTREMITY VENOUS (DVT) Referring Phys: DEBBY CROSLEY --------------------------------------------------------------------------------  Indications: Pulmonary embolism.  Comparison Study: no prior Performing Technologist: Archie Patten RVS  Examination Guidelines: A complete evaluation includes B-mode imaging, spectral Doppler, color Doppler, and power Doppler as needed of all accessible portions of each vessel. Bilateral testing is considered an integral part of a complete examination. Limited examinations for reoccurring indications may be performed as noted. The reflux portion of the exam is performed with the patient in reverse Trendelenburg.  +---------+---------------+---------+-----------+----------+--------------+ RIGHT    CompressibilityPhasicitySpontaneityPropertiesThrombus Aging +---------+---------------+---------+-----------+----------+--------------+ CFV      Full            Yes      Yes                                 +---------+---------------+---------+-----------+----------+--------------+ SFJ      Full                                                        +---------+---------------+---------+-----------+----------+--------------+ FV Prox  Full                                                        +---------+---------------+---------+-----------+----------+--------------+ FV Mid   Full                                                        +---------+---------------+---------+-----------+----------+--------------+  FV DistalFull                                                        +---------+---------------+---------+-----------+----------+--------------+ PFV      Full                                                        +---------+---------------+---------+-----------+----------+--------------+ POP      Full           Yes      Yes                                 +---------+---------------+---------+-----------+----------+--------------+ PTV      Full                                                        +---------+---------------+---------+-----------+----------+--------------+ PERO     Full                                                        +---------+---------------+---------+-----------+----------+--------------+   +---------+---------------+---------+-----------+----------+--------------+ LEFT     CompressibilityPhasicitySpontaneityPropertiesThrombus Aging +---------+---------------+---------+-----------+----------+--------------+ CFV      Full           Yes      Yes                                 +---------+---------------+---------+-----------+----------+--------------+ SFJ      Full                                                        +---------+---------------+---------+-----------+----------+--------------+ FV Prox  Full                                                         +---------+---------------+---------+-----------+----------+--------------+ FV Mid   Full                                                        +---------+---------------+---------+-----------+----------+--------------+ FV DistalFull                                                        +---------+---------------+---------+-----------+----------+--------------+   PFV      Full                                                        +---------+---------------+---------+-----------+----------+--------------+ POP      Full           Yes      Yes                                 +---------+---------------+---------+-----------+----------+--------------+ PTV      Full                                                        +---------+---------------+---------+-----------+----------+--------------+ PERO     Full                                                        +---------+---------------+---------+-----------+----------+--------------+     Summary: BILATERAL: - No evidence of deep vein thrombosis seen in the lower extremities, bilaterally. -No evidence of popliteal cyst, bilaterally.   *See table(s) above for measurements and observations. Electronically signed by Jamelle Haring on 10/03/2022 at 6:04:43 PM.    Final    CT Angio Chest Pulmonary Embolism (PE) W or WO Contrast  Result Date: 10/02/2022 CLINICAL DATA:  Short of breath, hypoxia, elevated D-dimer EXAM: CT ANGIOGRAPHY CHEST WITH CONTRAST TECHNIQUE: Multidetector CT imaging of the chest was performed using the standard protocol during bolus administration of intravenous contrast. Multiplanar CT image reconstructions and MIPs were obtained to evaluate the vascular anatomy. RADIATION DOSE REDUCTION: This exam was performed according to the departmental dose-optimization program which includes automated exposure control, adjustment of the mA and/or kV according to patient size and/or use of iterative reconstruction  technique. CONTRAST:  65m OMNIPAQUE IOHEXOL 350 MG/ML SOLN COMPARISON:  10/02/2022, 09/29/2022 FINDINGS: Cardiovascular: This is a technically adequate evaluation of the pulmonary vasculature. There is a filling defect within the anterior basilar segmental branch of the right lower lobe pulmonary artery, with peripheral lung consolidation consistent with pulmonary embolus and infarct. No other pulmonary emboli are identified. The heart is unremarkable without pericardial effusion. No evidence of thoracic aortic aneurysm or dissection. Atherosclerosis of the aorta and coronary vasculature. Mediastinum/Nodes: Continued adenopathy within the AP window measuring up to 2.4 cm in short axis. Thyroid, trachea, and esophagus are unremarkable. Lungs/Pleura: Right shaped consolidation and volume loss within the right lower lobe consistent with pulmonary infarct. Trace bilateral pleural effusions, right greater than left. Stable left pleural calcifications. There is opacification of the left mainstem bronchus as well as the upper and lower lobe segmental bronchi, consistent with mucoid impaction, retained secretions, or aspiration. Patchy airspace disease throughout the left lung consistent with inflammatory or infectious etiology, without significant change since prior exam. No pneumothorax. Upper Abdomen: No acute abnormality. Musculoskeletal: Chronic postsurgical or posttraumatic changes of the left thoracic cage, stable. No acute displaced fracture. Reconstructed images demonstrate no additional findings. Review of the MIP images  confirms the above findings. IMPRESSION: 1. Findings consistent with right lower lobe segmental pulmonary embolus, and associated pulmonary infarct. Minimal clot burden. No evidence of right heart strain. 2. Persistent nodular airspace disease throughout the left lung, compatible with inflammatory or infectious etiology. As per previous recommendation, short interval follow-up recommended to  document resolution after appropriate medical management. 3. Opacification of the left mainstem bronchus and segmental upper and lower lobe bronchi, consistent with retained secretions, mucoid impaction, or aspiration. 4. Stable adenopathy at the AP window. 5. Aortic Atherosclerosis (ICD10-I70.0). Coronary artery atherosclerosis. Critical Value/emergent results were called by telephone at the time of interpretation on 10/02/2022 at 8:34 pm to provider DR CROSLEY, who verbally acknowledged these results. Electronically Signed   By: Randa Ngo M.D.   On: 10/02/2022 20:36   DG CHEST PORT 1 VIEW  Result Date: 10/02/2022 CLINICAL DATA:  Shortness of breath. EXAM: PORTABLE CHEST 1 VIEW COMPARISON:  CT chest and chest x-ray dated September 29, 2022. FINDINGS: Interval extubation. Unchanged cardiomegaly. Similar left basilar patchy reticulonodular opacities. Improving interstitial opacity at the right lung base. Unchanged small bilateral pleural effusions. No pneumothorax. No acute osseous abnormality. IMPRESSION: 1. Unchanged left basilar and improving right basilar pneumonia. 2. Unchanged small bilateral pleural effusions. Electronically Signed   By: Titus Dubin M.D.   On: 10/02/2022 10:57   DG Hand Complete Left  Result Date: 10/01/2022 CLINICAL DATA:  Left hand pain. EXAM: LEFT HAND - COMPLETE 3+ VIEW COMPARISON:  None Available. FINDINGS: There is diffuse decreased bone mineralization. Mild-to-moderate interphalangeal joint space narrowing and peripheral osteophytosis diffusely throughout the first through fifth digits. Moderate thumb carpometacarpal joint space narrowing, subchondral sclerosis, and peripheral osteophytosis. No acute fracture is seen.  No dislocation. Mild calcification overlying the triangular fibrocartilage complex. IMPRESSION: 1. Moderate thumb carpometacarpal osteoarthritis. 2. Mild-to-moderate interphalangeal osteoarthritis diffusely throughout the first through fifth digits.  Electronically Signed   By: Yvonne Kendall M.D.   On: 10/01/2022 14:56   DG Shoulder Right  Result Date: 10/01/2022 CLINICAL DATA:  Right shoulder pain. EXAM: RIGHT SHOULDER - 2+ VIEW COMPARISON:  None Available. FINDINGS: There is diffuse decreased bone mineralization. Moderate to severe inferior glenohumeral joint space narrowing. Moderate inferior humeral head-neck junction degenerative osteophytosis. Mild peripheral glenoid degenerative osteophytosis. Mild-to-moderate superomedial humeral head cortical flattening/remodeling with diffuse subchondral sclerosis and mild subchondral cystic change. Mild acromioclavicular joint space narrowing and peripheral osteophytosis. No acute fracture or dislocation. The visualized portion of the right lung is unremarkable. Mild-to-moderate atherosclerotic calcifications within the aortic arch. IMPRESSION: 1. 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 2. Mild acromioclavicular osteoarthritis. Electronically Signed   By: Yvonne Kendall M.D.   On: 10/01/2022 14:55   ECHOCARDIOGRAM COMPLETE  Result Date: 09/29/2022    ECHOCARDIOGRAM REPORT   Patient Name:   BLIMY NAPOLEON Date of Exam: 09/29/2022 Medical Rec #:  [id number]     Height: Accession #:    [id number]    Weight:       111.8 lb Date of Birth:  01/03/1935     BSA:          1.370 m Patient Age:    40 years      BP:           168/131 mmHg Patient Gender: F             HR:           77 bpm. Exam Location:  Inpatient Procedure: 2D Echo, Cardiac Doppler and Color Doppler Indications:    Shock ([place]) [  [id number]]  History:        Patient has no prior history of Echocardiogram examinations.                 Septic Shock, Arrythmias:Bradycardia; Signs/Symptoms:Shortness                 of Breath.  Sonographer:    Greer Pickerel Referring Phys: [id number] Estill Cotta  Sonographer Comments: Technically challenging study due to limited acoustic windows, echo performed with patient supine and on artificial respirator and no  subcostal window. Image acquisition challenging due to patient body habitus and Image acquisition challenging due to respiratory motion. IMPRESSIONS  1. Left ventricular ejection fraction, by estimation, is 55 to 60%. The left ventricle has normal function. The left ventricle has no regional wall motion abnormalities. There is mild concentric left ventricular hypertrophy. Left ventricular diastolic parameters are consistent with Grade III diastolic dysfunction (restrictive).  2. Right ventricular systolic function is normal. The right ventricular size is normal.  3. Left atrial size was severely dilated.  4. Right atrial size was severely dilated.  5. The mitral valve is normal in structure. Mild to moderate mitral valve regurgitation. Mild mitral stenosis. Moderate mitral annular calcification.  6. Tricuspid valve regurgitation is mild to moderate.  7. The aortic valve is tricuspid. There is moderate calcification of the aortic valve. There is moderate thickening of the aortic valve. Aortic valve regurgitation is mild. Moderate aortic valve stenosis. Aortic valve mean gradient measures 25.0 mmHg. Comparison(s): No prior Echocardiogram. FINDINGS  Left Ventricle: Left ventricular ejection fraction, by estimation, is 55 to 60%. The left ventricle has normal function. The left ventricle has no regional wall motion abnormalities. The left ventricular internal cavity size was normal in size. There is  mild concentric left ventricular hypertrophy. Left ventricular diastolic parameters are consistent with Grade III diastolic dysfunction (restrictive). Right Ventricle: The right ventricular size is normal. Right vetricular wall thickness was not well visualized. Right ventricular systolic function is normal. Left Atrium: Left atrial size was severely dilated. Right Atrium: Right atrial size was severely dilated. Pericardium: There is no evidence of pericardial effusion. Mitral Valve: The mitral valve is normal in structure.  There is mild thickening of the mitral valve leaflet(s). There is mild calcification of the mitral valve leaflet(s). Moderate mitral annular calcification. Mild to moderate mitral valve regurgitation. Mild mitral valve stenosis. MV peak gradient, 8.6 mmHg. The mean mitral valve gradient is 3.0 mmHg. Tricuspid Valve: The tricuspid valve is normal in structure. Tricuspid valve regurgitation is mild to moderate. No evidence of tricuspid stenosis. Aortic Valve: The aortic valve is tricuspid. There is moderate calcification of the aortic valve. There is moderate thickening of the aortic valve. Aortic valve regurgitation is mild. Aortic regurgitation PHT measures 823 msec. Moderate aortic stenosis is present. Aortic valve mean gradient measures 25.0 mmHg. Aortic valve peak gradient measures 38.4 mmHg. Aortic valve area, by VTI measures 0.70 cm. Pulmonic Valve: The pulmonic valve was not well visualized. Pulmonic valve regurgitation is not visualized. Aorta: The aortic root and ascending aorta are structurally normal, with no evidence of dilitation. Venous: IVC assessment for right atrial pressure unable to be performed due to mechanical ventilation. IAS/Shunts: The atrial septum is grossly normal.  LEFT VENTRICLE PLAX 2D LVIDd:         3.90 cm   Diastology LVIDs:         2.80 cm   LV e' medial:    6.31 cm/s LV PW:  1.10 cm   LV E/e' medial:  23.0 LV IVS:        1.10 cm   LV e' lateral:   7.62 cm/s LVOT diam:     1.90 cm   LV E/e' lateral: 19.0 LV SV:         50 LV SV Index:   37 LVOT Area:     2.84 cm  RIGHT VENTRICLE RV Basal diam:  3.26 cm RV S prime:     10.00 cm/s TAPSE (M-mode): 1.7 cm LEFT ATRIUM              Index        RIGHT ATRIUM           Index LA diam:        4.20 cm  3.07 cm/m   RA Area:     21.00 cm LA Vol (A2C):   136.0 ml 99.27 ml/m  RA Volume:   71.00 ml  51.82 ml/m LA Vol (A4C):   110.0 ml 80.29 ml/m LA Biplane Vol: 127.0 ml 92.70 ml/m  AORTIC VALVE AV Area (Vmax):    0.72 cm AV Area  (Vmean):   0.66 cm AV Area (VTI):     0.70 cm AV Vmax:           309.67 cm/s AV Vmean:          237.333 cm/s AV VTI:            0.716 m AV Peak Grad:      38.4 mmHg AV Mean Grad:      25.0 mmHg LVOT Vmax:         78.35 cm/s LVOT Vmean:        55.100 cm/s LVOT VTI:          0.177 m LVOT/AV VTI ratio: 0.25 AI PHT:            823 msec  AORTA Ao Root diam: 2.80 cm Ao Asc diam:  3.50 cm MITRAL VALVE                TRICUSPID VALVE MV Area (PHT): 3.93 cm     TR Peak grad:   46.0 mmHg MV Area VTI:   1.70 cm     TR Vmax:        339.00 cm/s MV Peak grad:  8.6 mmHg MV Mean grad:  3.0 mmHg     SHUNTS MV Vmax:       1.47 m/s     Systemic VTI:  0.18 m MV Vmean:      74.4 cm/s    Systemic Diam: 1.90 cm MV Decel Time: 193 msec MR Peak grad: 100.0 mmHg MR Vmax:      500.00 cm/s MV E velocity: 145.00 cm/s MV A velocity: 36.70 cm/s MV E/A ratio:  3.95 Buford Dresser MD Electronically signed by Buford Dresser MD Signature Date/Time: 09/29/2022/11:44:13 AM    Final    CT CHEST WO CONTRAST  Result Date: 09/29/2022 CLINICAL DATA:  87 year old female with history of altered mental status. Pneumonia. EXAM: CT CHEST WITHOUT CONTRAST TECHNIQUE: Multidetector CT imaging of the chest was performed following the standard protocol without IV contrast. RADIATION DOSE REDUCTION: This exam was performed according to the departmental dose-optimization program which includes automated exposure control, adjustment of the mA and/or kV according to patient size and/or use of iterative reconstruction technique. COMPARISON:  No priors. FINDINGS: Cardiovascular: Heart size is borderline enlarged. There is no significant pericardial fluid, thickening or  pericardial calcification. There is aortic atherosclerosis, as well as atherosclerosis of the great vessels of the mediastinum and the coronary arteries, including calcified atherosclerotic plaque in the left main, left anterior descending, left circumflex and right coronary arteries.  Severe calcifications of the aortic valve. Mediastinum/Nodes: Enlarged AP window lymph node measuring up to 2.4 cm in short axis (axial image 58 of series 3). Oral contrast material in the esophagus. Nasogastric tube extending into the stomach. No axillary lymphadenopathy. Patient is intubated, with the tip of the endotracheal tube 2 cm above the carina. Lungs/Pleura: Scattered areas of bronchial wall thickening, severe thickening of the peribronchovascular interstitium, cylindrical bronchiectasis, extensive mucoid impaction and extensive peribronchovascular micro and macronodularity are noted throughout the lungs bilaterally (left-greater-than-right). Findings are most evident in the left mid to lower lung where the largest of the nodules measure up to 11 x 10 mm (axial image 87 of series 4). Trace bilateral pleural effusions. Calcified pleural plaques in the left hemithorax, presumably sequela of remote left pleural infection or hemorrhage. Upper Abdomen: Aortic atherosclerosis. Musculoskeletal: There are no aggressive appearing lytic or blastic lesions noted in the visualized portions of the skeleton. IMPRESSION: 1. 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 However, the possibility of neoplasm is not entirely excluded. Short-term follow-up noncontrast chest CT is recommended in 3 months, preferably after trial of antimicrobial therapy to ensure the stability or regression of these findings and exclude neoplasm. 2. Enlarged AP window lymph node, potentially reactive in the setting of chronic infection. Attention at time of repeat noncontrast chest CT is recommended to ensure stability or regression. 3. Aortic atherosclerosis, in addition to left main and three-vessel coronary artery disease. 4. There are calcifications of the aortic valve. Echocardiographic correlation for evaluation of potential valvular  dysfunction may be warranted if clinically indicated. Aortic Atherosclerosis (ICD10-I70.0). Electronically Signed   By: Vinnie Langton M.D.   On: 09/29/2022 07:26   CT HEAD WO CONTRAST (5MM)  Result Date: 09/29/2022 CLINICAL DATA:  Altered mental status with unknown cause EXAM: CT HEAD WITHOUT CONTRAST TECHNIQUE: Contiguous axial images were obtained from the base of the skull through the vertex without intravenous contrast. RADIATION DOSE REDUCTION: This exam was performed according to the departmental dose-optimization program which includes automated exposure control, adjustment of the mA and/or kV according to patient size and/or use of iterative reconstruction technique. COMPARISON:  06/27/2022 FINDINGS: Brain: No evidence of acute infarction, hemorrhage, hydrocephalus, extra-axial collection or mass lesion/mass effect. Patchy low-density in the cerebral white matter attributed to chronic small vessel ischemia. Generalized cerebral volume loss that is mild for age. Vascular: Atheromatous calcification Skull: Normal. Negative for fracture or focal lesion. Sinuses/Orbits: Patchy bilateral sinus opacification, chronic. IMPRESSION: Senescent changes without acute or reversible finding. Electronically Signed   By: Jorje Guild M.D.   On: 09/29/2022 07:08   DG Abdomen 1 View  Result Date: 09/29/2022 CLINICAL DATA:  Check gastric catheter placement EXAM: ABDOMEN - 1 VIEW COMPARISON:  None Available. FINDINGS: Gastric catheter is noted within the stomach. Proximal side port lies at the gastroesophageal junction. This could be advanced deeper into the stomach. No free air is seen. No obstructive changes are noted. Right femoral central line is noted standing into the IVC. IMPRESSION: Gastric catheter as described. This could be advanced deeper into the stomach. Electronically Signed   By: Inez Catalina M.D.   On: 09/29/2022 03:18   DG Chest Portable 1 View  Result Date: 09/29/2022 CLINICAL DATA:   Intubation. EXAM: PORTABLE CHEST 1 VIEW COMPARISON:  09/29/2022. FINDINGS: The heart size and mediastinal contours are stable. There is atherosclerotic calcification of the aorta. Patchy opacities are present at the lung bases bilaterally. There is blunting of the costophrenic angles, possible small pleural effusions. No pneumothorax. Apical pleural thickening is unchanged. Degenerative changes are present in the thoracic spine. The distal tip of the endotracheal tube terminates 2.9 cm above the carina. IMPRESSION: 1. The endotracheal tube terminates 2.9 cm above the carina. 2. Remaining findings are unchanged. Electronically Signed   By: Brett Fairy M.D.   On: 09/29/2022 02:18   DG Chest Port 1 View  Result Date: 09/29/2022 CLINICAL DATA:  Hematemesis. EXAM: PORTABLE CHEST 1 VIEW COMPARISON:  Remote radiograph 11/24/2005 FINDINGS: Rotated exam. The heart is normal in size. There are patchy ill-defined opacities at both lung bases. Blunting of both costophrenic angles may represent small effusions. Mild biapical pleuroparenchymal scarring. No pulmonary edema. Limited assessment, no acute osseous findings IMPRESSION: 1. Patchy ill-defined opacities at both lung bases, suspicious for pneumonia. Recommend radiographic follow-up to resolution. 2. Possible small bilateral pleural effusions. Electronically Signed   By: Keith Rake M.D.   On: 09/29/2022 01:46    Microbiology: Results for orders placed or performed during the hospital encounter of 09/29/22  Culture, blood (Routine X 2) w Reflex to ID Panel     Status: None   Collection Time: 09/29/22  1:56 AM   Specimen: BLOOD  Result Value Ref Range Status   Specimen Description BLOOD SITE NOT SPECIFIED  Final   Special Requests   Final    BOTTLES DRAWN AEROBIC AND ANAEROBIC Blood Culture results may not be optimal due to an inadequate volume of blood received in culture bottles   Culture   Final    NO GROWTH 5 DAYS Performed at [facility], [place] [address] [place], [place] [address]    Report Status 10/04/2022 FINAL  Final  Culture, blood (Routine X 2) w Reflex to ID Panel     Status: None   Collection Time: 09/29/22  3:12 AM   Specimen: BLOOD  Result Value Ref Range Status   Specimen Description BLOOD RIGHT ANTECUBITAL  Final   Special Requests   Final    BOTTLES DRAWN AEROBIC AND ANAEROBIC Blood Culture adequate volume   Culture   Final    NO GROWTH 5 DAYS Performed at [facility], [place] [address] [place], [place] [address]    Report Status 10/04/2022 FINAL  Final  Resp panel by RT-PCR (RSV, Flu A&B, Covid) Anterior Nasal Swab     Status: None   Collection Time: 09/29/22  3:34 AM   Specimen: Anterior Nasal Swab  Result Value Ref Range Status   SARS Coronavirus 2 by RT PCR NEGATIVE NEGATIVE Final    Comment: (NOTE) SARS-CoV-2 target nucleic acids are NOT DETECTED.  The SARS-CoV-2 RNA is generally detectable in upper respiratory specimens during the acute phase of infection. The lowest concentration of SARS-CoV-2 viral copies this assay can detect is 138 copies/mL. A negative result does not preclude SARS-Cov-2 infection and should not be used as the sole basis for treatment or other patient management decisions. A negative result may occur with  improper specimen collection/handling, submission of specimen other than nasopharyngeal swab, presence of viral mutation(s) within the areas targeted by this assay, and inadequate number of viral copies(<138 copies/mL). A negative result must be combined with clinical observations,  patient history, and epidemiological information. The expected result is Negative.  Fact Sheet for Patients:  EntrepreneurPulse.com.au  Fact Sheet for Healthcare Providers:  IncredibleEmployment.be  This test is no t yet approved or cleared by the [place] FDA and  has been authorized for detection and/or diagnosis of SARS-CoV-2 by FDA under  an Emergency Use Authorization (EUA). This EUA will remain  in effect (meaning this test can be used) for the duration of the COVID-19 declaration under Section 564(b)(1) of the Act, 21 U.S.C.section 360bbb-3(b)(1), unless the authorization is terminated  or revoked sooner.       Influenza A by PCR NEGATIVE NEGATIVE Final   Influenza B by PCR NEGATIVE NEGATIVE Final    Comment: (NOTE) The Xpert Xpress SARS-CoV-2/FLU/RSV plus assay is intended as an aid in the diagnosis of influenza from Nasopharyngeal swab specimens and should not be used as a sole basis for treatment. Nasal washings and aspirates are unacceptable for Xpert Xpress SARS-CoV-2/FLU/RSV testing.  Fact Sheet for Patients: EntrepreneurPulse.com.au  Fact Sheet for Healthcare Providers: IncredibleEmployment.be  This test is not yet approved or cleared by the [place] FDA and has been authorized for detection and/or diagnosis of SARS-CoV-2 by FDA under an Emergency Use Authorization (EUA). This EUA will remain in effect (meaning this test can be used) for the duration of the COVID-19 declaration under Section 564(b)(1) of the Act, 21 U.S.C. section 360bbb-3(b)(1), unless the authorization is terminated or revoked.     Resp Syncytial Virus by PCR NEGATIVE NEGATIVE Final    Comment: (NOTE) Fact Sheet for Patients: EntrepreneurPulse.com.au  Fact Sheet for Healthcare Providers: IncredibleEmployment.be  This test is not yet approved or cleared by the [place] FDA and has been authorized for detection and/or diagnosis of SARS-CoV-2 by FDA under an Emergency Use Authorization (EUA). This EUA will remain in effect (meaning this test can be used) for the duration of the COVID-19 declaration under Section 564(b)(1) of the Act, 21 U.S.C. section 360bbb-3(b)(1), unless the authorization is terminated or revoked.  Performed at [facility], [place] [address] [place], [place] [address]   MRSA Next Gen by PCR, Nasal     Status: None   Collection Time: 09/29/22  6:06 AM   Specimen: Nasal Mucosa; Nasal Swab  Result Value Ref Range Status   MRSA by PCR Next Gen NOT DETECTED NOT DETECTED Final    Comment: (NOTE) The GeneXpert MRSA Assay (FDA approved for NASAL specimens only), is one component of a comprehensive MRSA colonization surveillance program. It is not intended to diagnose MRSA infection nor to guide or monitor treatment for MRSA infections. Test performance is not FDA approved in patients less than 31 years old. Performed at [facility], [place] [address], [place], [place] [address]     Labs: CBC: Recent Labs  Lab 10/04/22 0134 10/05/22 0134 10/06/22 0600 10/06/22 1822 10/07/22 0214 10/07/22 1829 10/08/22 0151  WBC 12.8* 11.3* 10.0  --  9.9  --  11.4*  HGB 8.6* 8.4* 7.9* 8.4* 7.8* 8.8* 8.3*  HCT 26.6* 25.3* 24.1* 26.1* 23.7* 27.3* 25.6*  MCV 88.1 86.1 87.3  --  86.5  --  88.0  PLT 283 289 286  --  283  --  144   Basic Metabolic Panel: Recent Labs  Lab 10/02/22 0211 10/03/22 0540 10/04/22 0134 10/05/22 0134 10/06/22 0600 10/08/22 0151  NA 139 139 137 140 137 141  K 3.5 4.0 4.2 3.3* 3.5 3.0*  CL 108 100 104 104 103  105  CO2 _0 GLUCOSE 98 130* 152* 115* 87 95  BUN 11 15 25* _1 CREATININE 0.79 1.05* 0.85 0.81 0.85 0.80  CALCIUM 8.6* 9.3 8.8* 8.9 8.9 8.8*  MG 1.8  --   --   --   --   --    Liver Function Tests: Recent Labs  Lab 10/02/22 0211  AST 33  ALT 72*  ALKPHOS 83  BILITOT 0.2*  PROT 5.4*  ALBUMIN 2.8*   CBG: Recent Labs  Lab 10/07/22 1631 10/07/22 1653 10/07/22 2139 10/08/22 0747 10/08/22 1136  GLUCAP 194* 161* 117* 80 88    Discharge time spent: greater than 30 minutes.  Signed: Cristela Felt, MD [facility] 10/08/2022

## 2022-10-08 NOTE — TOC Progression Note (Signed)
Transition of Care [facility]) - Progression Note    Patient Details  Name: Nicole [name] MRN: [id number] Date of Birth: [birth year]  [facility]) CM/SW Contact  Mearl Latin, LCSW Phone Number: 10/08/2022, 10:16 AM  Clinical Narrative:    Per MD, patient ready for discharge today. CSW updated [facility]). CSW spoke with patient's daughter, Selena Batten. She will plan on coming to patient up at the hospital around 3-3:30pm.   Planned Disposition: Skilled Nursing Facility Barriers to Discharge: Continued Medical Work up  Expected Discharge Plan and Services In-house Referral: Clinical Social Work     Living arrangements for the past 2 months: Independent Living Facility ([facility])                                       Social Determinants of Health (SDOH) Interventions    Readmission Risk Interventions     No data to display

## 2022-10-08 NOTE — Progress Notes (Signed)
Mobility Specialist - Progress Note   10/08/22 1004  Mobility  Activity Ambulated with assistance to bathroom  Level of Assistance Standby assist, set-up cues, supervision of patient - no hands on  Assistive Device Front wheel walker  Distance Ambulated (ft) 20 ft  Activity Response Tolerated well  Mobility Referral Yes  $Mobility charge 1 Mobility   Pt received in bed and requesting assistance to BR. No complaints throughout. Pt was returned to bed with all needs met and MD present.   Franki Monte  Mobility Specialist Please contact via Solicitor or Rehab office at [phone]

## 2022-10-08 NOTE — Progress Notes (Signed)
Physical Therapy Treatment Patient Details Name: Nicole [name] MRN: [id number] DOB: Jun 22, 1935 Today's Date: 10/08/2022   History of Present Illness Pt is an 87 y.o. female admitted from ILF with SOB, bradycardia, emesis. CXR concerning for aspiration. Workup for acute respiratory failure and metabolic encephalopathy secondary to sepsis, suspected RLL aspiration PNA. ETT 12/11-12/12. PMH includes recent admission for fall 06/2022 sustaining scalp hematoma, otherwise no known PMH.    PT Comments    Pt sitting up in recliner on entry, requests assistance with ordering lunch. Pt expresses increased frustration in not knowing whether she will be able to go straight home today or need short stay at rehab. PT discussed benefits of short SNF stay on her balance. Pt limited by decreased safety awareness in presence of decreased strength and balance. Pt able to power up from recliner without assist but then has posterior loss of balance requiring modA for steadying. Able to walk in hallway with RW requiring min A for navigation around obstacles and for making turns. D/c plans remain appropriate.     Recommendations for follow up therapy are one component of a multi-disciplinary discharge planning process, led by the attending physician.  Recommendations may be updated based on patient status, additional functional criteria and insurance authorization.  Follow Up Recommendations  Skilled nursing-short term rehab (<3 hours/day) Can patient physically be transported by private vehicle: Yes   Assistance Recommended at Discharge Frequent or constant Supervision/Assistance  Patient can return home with the following A little help with walking and/or transfers;A little help with bathing/dressing/bathroom;Assistance with cooking/housework;Assist for transportation;Direct supervision/assist for medications management;Direct supervision/assist for financial management;Help with stairs or ramp for entrance    Equipment Recommendations   (TBD)       Precautions / Restrictions Precautions Precautions: Fall Restrictions Weight Bearing Restrictions: No     Mobility  Bed Mobility               General bed mobility comments: sitting up in recliner on entry    Transfers Overall transfer level: Needs assistance Equipment used: Rolling walker (2 wheels) Transfers: Sit to/from Stand Sit to Stand: Mod assist           General transfer comment: good power up but has posterior LoB requiring modA for steadying once in upright    Ambulation/Gait Ambulation/Gait assistance: Min guard, Min assist Gait Distance (Feet): 300 Feet Assistive device: Rolling walker (2 wheels) Gait Pattern/deviations: Step-to pattern, Step-through pattern, Decreased stride length, Shuffle, Trunk flexed Gait velocity: Decreased Gait velocity interpretation: <1.31 ft/sec, indicative of household ambulator   General Gait Details: min guard for straight line ambulation, however requires min A and increased cuing for turning and navigating around obstacles in hallway, needs cuing for proximity to RW, and safety attention     Balance Overall balance assessment: Needs assistance Sitting-balance support: No upper extremity supported, Feet supported Sitting balance-Leahy Scale: Fair     Standing balance support: No upper extremity supported, During functional activity Standing balance-Leahy Scale: Fair Standing balance comment: statically                            Cognition Arousal/Alertness: Awake/alert Behavior During Therapy: WFL for tasks assessed/performed Overall Cognitive Status: Within Functional Limits for tasks assessed                                 General Comments: verbose but pleasant and WFL.  pt reports she gets a little anxious about her breathing sometimes           General Comments General comments (skin integrity, edema, etc.): VSS on RA       Pertinent Vitals/Pain Pain Assessment Pain Assessment: Faces Faces Pain Scale: Hurts a little bit Pain Location: generalized with movement Pain Descriptors / Indicators: Discomfort Pain Intervention(s): Limited activity within patient's tolerance, Monitored during session, Repositioned     PT Goals (current goals can now be found in the care plan section) Acute Rehab PT Goals Patient Stated Goal: return to ILF after short stay in ALF for therapy. PT Goal Formulation: With patient Time For Goal Achievement: 10/14/22 Potential to Achieve Goals: Good Progress towards PT goals: Progressing toward goals    Frequency    Min 2X/week      PT Plan Current plan remains appropriate       AM-PAC PT "6 Clicks" Mobility   Outcome Measure  Help needed turning from your back to your side while in a flat bed without using bedrails?: A Little Help needed moving from lying on your back to sitting on the side of a flat bed without using bedrails?: None Help needed moving to and from a bed to a chair (including a wheelchair)?: A Little Help needed standing up from a chair using your arms (e.g., wheelchair or bedside chair)?: A Lot Help needed to walk in hospital room?: A Little Help needed climbing 3-5 steps with a railing? : A Lot 6 Click Score: 17    End of Session Equipment Utilized During Treatment: Gait belt Activity Tolerance: Patient tolerated treatment well Patient left: with call bell/phone within reach;in chair;with chair alarm set Nurse Communication: Mobility status PT Visit Diagnosis: Other abnormalities of gait and mobility (R26.89);Muscle weakness (generalized) (M62.81)     Time: 9528-4132 PT Time Calculation (min) (ACUTE ONLY): 21 min  Charges:  $Gait Training: 8-22 mins                     Sabastian Raimondi B. Beverely Risen PT, DPT Acute Rehabilitation Services Please use secure chat or  Call Office [phone]    Elon Alas [facility] 10/08/2022, 12:46 PM

## 2022-10-08 NOTE — TOC Transition Note (Signed)
Transition of Care [facility]) - CM/SW Discharge Note   Patient Details  Name: Nicole [name] MRN: [id number] Date of Birth: January 25, 1935  Transition of Care [facility]) CM/SW Contact:  Mearl Latin, LCSW Phone Number: 10/08/2022, 12:49 PM   Clinical Narrative:    Patient will DC to: [facility] Anticipated DC date: 10/08/22 Family notified: Daughter, Insurance claims handler by: Daughter Kim at 3:30pm   Per MD patient ready for DC to [place]. RN to call report prior to discharge [phone] room [phone]). RN, patient, patient's family, and facility notified of DC. Discharge Summary and FL2 sent to facility.   CSW will sign off for now as social work intervention is no longer needed. Please consult [place] again if new needs arise.     Final next level of care: Skilled Nursing Facility Barriers to Discharge: Barriers Resolved   Patient Goals and CMS Choice Patient states their goals for this hospitalization and ongoing recovery are:: to return back to her independant living CMS Medicare.gov Compare Post Acute Care list provided to:: Patient Choice offered to / list presented to : Patient [place] ownership interest in [facility].provided to:: Adult Children  Discharge Placement   Existing PASRR number confirmed : 10/08/22          Patient [facility] bed at: [facility] Patient to be transferred to facility by: [facility] Name of family member notified: Daughter Patient and family notified of of transfer: 10/08/22  Discharge Plan and Services In-house Referral: Clinical Social Work                                   Social Determinants of Health (SDOH) Interventions     Readmission Risk Interventions     No data to display

## 2022-10-08 NOTE — Progress Notes (Signed)
Mobility Specialist - Progress Note   10/08/22 1130  Mobility  Activity Transferred from bed to chair  Level of Assistance Standby assist, set-up cues, supervision of patient - no hands on  Assistive Device Front wheel walker  Activity Response Tolerated well  Mobility Referral Yes  $Mobility charge 1 Mobility   Pt received in bed requesting assistance to chair. No complaints throughout. Pt was left in chair with all needs met and chair alarm on.  Franki Monte  Mobility Specialist Please contact via Solicitor or Rehab office at [phone]

## 2022-10-21 ENCOUNTER — Other Ambulatory Visit: Payer: Self-pay

## 2022-10-21 DIAGNOSIS — R9389 Abnormal findings on diagnostic imaging of other specified body structures: Secondary | ICD-10-CM

## 2022-10-28 ENCOUNTER — Emergency Department (HOSPITAL_COMMUNITY): Payer: Medicare Other

## 2022-10-28 ENCOUNTER — Observation Stay (HOSPITAL_COMMUNITY): Payer: Medicare Other

## 2022-10-28 ENCOUNTER — Other Ambulatory Visit: Payer: Self-pay

## 2022-10-28 ENCOUNTER — Inpatient Hospital Stay (HOSPITAL_COMMUNITY)
Admission: EM | Admit: 2022-10-28 | Discharge: 2022-10-31 | DRG: 683 | Disposition: A | Discharge status: Home Health | Payer: Medicare Other | Attending: Internal Medicine | Admitting: Internal Medicine

## 2022-10-28 ENCOUNTER — Encounter (HOSPITAL_COMMUNITY): Payer: Self-pay | Admitting: Emergency Medicine

## 2022-10-28 DIAGNOSIS — Z7901 Long term (current) use of anticoagulants: Secondary | ICD-10-CM

## 2022-10-28 DIAGNOSIS — T464X5A Adverse effect of angiotensin-converting-enzyme inhibitors, initial encounter: Secondary | ICD-10-CM | POA: Diagnosis present

## 2022-10-28 DIAGNOSIS — I700 Atherosclerosis of aorta: Secondary | ICD-10-CM | POA: Diagnosis present

## 2022-10-28 DIAGNOSIS — J189 Pneumonia, unspecified organism: Secondary | ICD-10-CM | POA: Diagnosis not present

## 2022-10-28 DIAGNOSIS — E871 Hypo-osmolality and hyponatremia: Secondary | ICD-10-CM | POA: Diagnosis not present

## 2022-10-28 DIAGNOSIS — E785 Hyperlipidemia, unspecified: Secondary | ICD-10-CM | POA: Diagnosis present

## 2022-10-28 DIAGNOSIS — Z885 Allergy status to narcotic agent status: Secondary | ICD-10-CM

## 2022-10-28 DIAGNOSIS — Z7951 Long term (current) use of inhaled steroids: Secondary | ICD-10-CM

## 2022-10-28 DIAGNOSIS — J470 Bronchiectasis with acute lower respiratory infection: Secondary | ICD-10-CM | POA: Diagnosis present

## 2022-10-28 DIAGNOSIS — I2510 Atherosclerotic heart disease of native coronary artery without angina pectoris: Secondary | ICD-10-CM | POA: Diagnosis present

## 2022-10-28 DIAGNOSIS — Z86711 Personal history of pulmonary embolism: Secondary | ICD-10-CM

## 2022-10-28 DIAGNOSIS — I110 Hypertensive heart disease with heart failure: Secondary | ICD-10-CM | POA: Diagnosis present

## 2022-10-28 DIAGNOSIS — J439 Emphysema, unspecified: Secondary | ICD-10-CM | POA: Diagnosis present

## 2022-10-28 DIAGNOSIS — E039 Hypothyroidism, unspecified: Secondary | ICD-10-CM | POA: Diagnosis present

## 2022-10-28 DIAGNOSIS — Z1152 Encounter for screening for COVID-19: Secondary | ICD-10-CM

## 2022-10-28 DIAGNOSIS — N179 Acute kidney failure, unspecified: Secondary | ICD-10-CM | POA: Diagnosis not present

## 2022-10-28 DIAGNOSIS — R636 Underweight: Secondary | ICD-10-CM | POA: Diagnosis present

## 2022-10-28 DIAGNOSIS — Z79890 Hormone replacement therapy (postmenopausal): Secondary | ICD-10-CM

## 2022-10-28 DIAGNOSIS — F419 Anxiety disorder, unspecified: Secondary | ICD-10-CM | POA: Diagnosis present

## 2022-10-28 DIAGNOSIS — Z66 Do not resuscitate: Secondary | ICD-10-CM | POA: Diagnosis present

## 2022-10-28 DIAGNOSIS — Z681 Body mass index (BMI) 19 or less, adult: Secondary | ICD-10-CM

## 2022-10-28 DIAGNOSIS — Z880 Allergy status to penicillin: Secondary | ICD-10-CM

## 2022-10-28 DIAGNOSIS — Z79899 Other long term (current) drug therapy: Secondary | ICD-10-CM

## 2022-10-28 DIAGNOSIS — I5032 Chronic diastolic (congestive) heart failure: Secondary | ICD-10-CM | POA: Diagnosis present

## 2022-10-28 LAB — RESP PANEL BY RT-PCR (RSV, FLU A&B, COVID)  RVPGX2
Influenza A by PCR: NEGATIVE
Influenza B by PCR: NEGATIVE
Resp Syncytial Virus by PCR: NEGATIVE
SARS Coronavirus 2 by RT PCR: NEGATIVE

## 2022-10-28 LAB — CBC WITH DIFFERENTIAL/PLATELET
Abs Immature Granulocytes: 0.04 10*3/uL (ref 0.00–0.07)
Basophils Absolute: 0 10*3/uL (ref 0.0–0.1)
Basophils Relative: 1 %
Eosinophils Absolute: 0 10*3/uL (ref 0.0–0.5)
Eosinophils Relative: 0 %
HCT: 28.4 % — ABNORMAL LOW (ref 36.0–46.0)
Hemoglobin: 9.4 g/dL — ABNORMAL LOW (ref 12.0–15.0)
Immature Granulocytes: 1 %
Lymphocytes Relative: 18 %
Lymphs Abs: 1.6 10*3/uL (ref 0.7–4.0)
MCH: 28.5 pg (ref 26.0–34.0)
MCHC: 33.1 g/dL (ref 30.0–36.0)
MCV: 86.1 fL (ref 80.0–100.0)
Monocytes Absolute: 0.6 10*3/uL (ref 0.1–1.0)
Monocytes Relative: 7 %
Neutro Abs: 6.5 10*3/uL (ref 1.7–7.7)
Neutrophils Relative %: 73 %
Platelets: 314 10*3/uL (ref 150–400)
RBC: 3.3 MIL/uL — ABNORMAL LOW (ref 3.87–5.11)
RDW: 14.9 % (ref 11.5–15.5)
WBC: 8.8 10*3/uL (ref 4.0–10.5)
nRBC: 0 % (ref 0.0–0.2)

## 2022-10-28 LAB — BASIC METABOLIC PANEL
Anion gap: 9 (ref 5–15)
BUN: 31 mg/dL — ABNORMAL HIGH (ref 8–23)
CO2: 23 mmol/L (ref 22–32)
Calcium: 8.8 mg/dL — ABNORMAL LOW (ref 8.9–10.3)
Chloride: 98 mmol/L (ref 98–111)
Creatinine, Ser: 2.07 mg/dL — ABNORMAL HIGH (ref 0.44–1.00)
GFR, Estimated: 23 mL/min — ABNORMAL LOW (ref >60)
Glucose, Bld: 98 mg/dL (ref 70–99)
Potassium: 3.8 mmol/L (ref 3.5–5.1)
Sodium: 130 mmol/L — ABNORMAL LOW (ref 135–145)

## 2022-10-28 LAB — COMPREHENSIVE METABOLIC PANEL
ALT: 14 U/L (ref 0–44)
AST: 18 U/L (ref 15–41)
Albumin: 3.7 g/dL (ref 3.5–5.0)
Alkaline Phosphatase: 75 U/L (ref 38–126)
Anion gap: 10 (ref 5–15)
BUN: 34 mg/dL — ABNORMAL HIGH (ref 8–23)
CO2: 21 mmol/L — ABNORMAL LOW (ref 22–32)
Calcium: 9.5 mg/dL (ref 8.9–10.3)
Chloride: 96 mmol/L — ABNORMAL LOW (ref 98–111)
Creatinine, Ser: 2.56 mg/dL — ABNORMAL HIGH (ref 0.44–1.00)
GFR, Estimated: 18 mL/min — ABNORMAL LOW (ref >60)
Glucose, Bld: 105 mg/dL — ABNORMAL HIGH (ref 70–99)
Potassium: 4.4 mmol/L (ref 3.5–5.1)
Sodium: 127 mmol/L — ABNORMAL LOW (ref 135–145)
Total Bilirubin: 0.5 mg/dL (ref 0.3–1.2)
Total Protein: 6.7 g/dL (ref 6.5–8.1)

## 2022-10-28 LAB — URINALYSIS, ROUTINE W REFLEX MICROSCOPIC
Bilirubin Urine: NEGATIVE
Glucose, UA: NEGATIVE mg/dL
Hgb urine dipstick: NEGATIVE
Ketones, ur: NEGATIVE mg/dL
Leukocytes,Ua: NEGATIVE
Nitrite: NEGATIVE
Protein, ur: NEGATIVE mg/dL
Specific Gravity, Urine: 1.006 (ref 1.005–1.030)
pH: 5 (ref 5.0–8.0)

## 2022-10-28 LAB — T4, FREE: Free T4: 1.53 ng/dL — ABNORMAL HIGH (ref 0.61–1.12)

## 2022-10-28 LAB — PROTIME-INR
INR: 1.4 — ABNORMAL HIGH (ref 0.8–1.2)
Prothrombin Time: 17.3 seconds — ABNORMAL HIGH (ref 11.4–15.2)

## 2022-10-28 LAB — BRAIN NATRIURETIC PEPTIDE: B Natriuretic Peptide: 371.9 pg/mL — ABNORMAL HIGH (ref 0.0–100.0)

## 2022-10-28 LAB — TROPONIN I (HIGH SENSITIVITY)
Troponin I (High Sensitivity): 5 ng/L (ref <18)
Troponin I (High Sensitivity): 5 ng/L (ref <18)

## 2022-10-28 LAB — TSH: TSH: 10.437 u[IU]/mL — ABNORMAL HIGH (ref 0.350–4.500)

## 2022-10-28 LAB — APTT: aPTT: 36 seconds (ref 24–36)

## 2022-10-28 LAB — LACTIC ACID, PLASMA: Lactic Acid, Venous: 0.9 mmol/L (ref 0.5–1.9)

## 2022-10-28 MED ORDER — LEVOTHYROXINE SODIUM 50 MCG PO TABS
75.0000 ug | ORAL_TABLET | Freq: Every day | ORAL | Status: DC
  Administered 2022-10-28 – 2022-10-31 (×4): 75 ug via ORAL
  Filled 2022-10-28 (×4): qty 1

## 2022-10-28 MED ORDER — APIXABAN 5 MG PO TABS
5.0000 mg | ORAL_TABLET | Freq: Two times a day (BID) | ORAL | Status: DC
  Administered 2022-10-28 – 2022-10-31 (×7): 5 mg via ORAL
  Filled 2022-10-28 (×7): qty 1

## 2022-10-28 MED ORDER — TIOTROPIUM BROMIDE MONOHYDRATE 18 MCG IN CAPS: 18.0000 ug | ORAL_CAPSULE | Freq: Every day | RESPIRATORY_TRACT | Status: DC

## 2022-10-28 MED ORDER — MONTELUKAST SODIUM 10 MG PO TABS
10.0000 mg | ORAL_TABLET | Freq: Every day | ORAL | Status: DC
  Administered 2022-10-28 – 2022-10-30 (×3): 10 mg via ORAL
  Filled 2022-10-28 (×4): qty 1

## 2022-10-28 MED ORDER — VERAPAMIL HCL ER 180 MG PO TBCR
180.0000 mg | EXTENDED_RELEASE_TABLET | Freq: Every day | ORAL | Status: DC
  Administered 2022-10-28 – 2022-10-30 (×3): 180 mg via ORAL
  Filled 2022-10-28 (×3): qty 1

## 2022-10-28 MED ORDER — PANTOPRAZOLE SODIUM 40 MG PO TBEC
40.0000 mg | DELAYED_RELEASE_TABLET | Freq: Two times a day (BID) | ORAL | Status: DC
  Administered 2022-10-28 – 2022-10-31 (×7): 40 mg via ORAL
  Filled 2022-10-28 (×7): qty 1

## 2022-10-28 MED ORDER — LINEZOLID 600 MG/300ML IV SOLN: 600.0000 mg | Freq: Once | INTRAVENOUS | Status: DC

## 2022-10-28 MED ORDER — ACETAMINOPHEN 500 MG PO TABS
1000.0000 mg | ORAL_TABLET | Freq: Once | ORAL | Status: AC
  Administered 2022-10-28: 1000 mg via ORAL
  Filled 2022-10-28: qty 2

## 2022-10-28 MED ORDER — SODIUM CHLORIDE 0.9 % IV SOLN: INTRAVENOUS | Status: AC

## 2022-10-28 MED ORDER — LEVOCETIRIZINE DIHYDROCHLORIDE 5 MG PO TABS: 5.0000 mg | ORAL_TABLET | Freq: Every day | ORAL | Status: DC

## 2022-10-28 MED ORDER — SODIUM CHLORIDE 0.9 % IV SOLN: INTRAVENOUS | Status: DC

## 2022-10-28 MED ORDER — LINEZOLID 600 MG/300ML IV SOLN
600.0000 mg | Freq: Once | INTRAVENOUS | Status: AC
  Administered 2022-10-28: 600 mg via INTRAVENOUS
  Filled 2022-10-28: qty 300

## 2022-10-28 MED ORDER — ALBUTEROL SULFATE (2.5 MG/3ML) 0.083% IN NEBU: 2.5000 mg | INHALATION_SOLUTION | RESPIRATORY_TRACT | Status: DC | PRN

## 2022-10-28 MED ORDER — SODIUM CHLORIDE 0.9 % IV BOLUS
500.0000 mL | Freq: Once | INTRAVENOUS | Status: AC
  Administered 2022-10-28: 500 mL via INTRAVENOUS

## 2022-10-28 MED ORDER — UMECLIDINIUM BROMIDE 62.5 MCG/ACT IN AEPB
1.0000 | INHALATION_SPRAY | Freq: Every day | RESPIRATORY_TRACT | Status: DC
  Administered 2022-10-29 – 2022-10-31 (×3): 1 via RESPIRATORY_TRACT
  Filled 2022-10-28: qty 7

## 2022-10-28 MED ORDER — METOPROLOL SUCCINATE ER 50 MG PO TB24
50.0000 mg | ORAL_TABLET | Freq: Every day | ORAL | Status: DC
  Administered 2022-10-28 – 2022-10-30 (×3): 50 mg via ORAL
  Filled 2022-10-28 (×3): qty 1

## 2022-10-28 MED ORDER — SODIUM CHLORIDE 0.9 % IV SOLN
2.0000 g | INTRAVENOUS | Status: DC
  Administered 2022-10-29: 2 g via INTRAVENOUS
  Filled 2022-10-28: qty 12.5

## 2022-10-28 MED ORDER — FLUTICASONE FUROATE-VILANTEROL 200-25 MCG/ACT IN AEPB
1.0000 | INHALATION_SPRAY | Freq: Every day | RESPIRATORY_TRACT | Status: DC
  Administered 2022-10-29 – 2022-10-31 (×3): 1 via RESPIRATORY_TRACT
  Filled 2022-10-28: qty 28

## 2022-10-28 MED ORDER — IPRATROPIUM-ALBUTEROL 0.5-2.5 (3) MG/3ML IN SOLN: 3.0000 mL | RESPIRATORY_TRACT | Status: DC | PRN

## 2022-10-28 MED ORDER — SODIUM CHLORIDE 0.9 % IV SOLN
1.0000 g | Freq: Once | INTRAVENOUS | Status: AC
  Administered 2022-10-28: 1 g via INTRAVENOUS
  Filled 2022-10-28: qty 10

## 2022-10-28 NOTE — H&P (Signed)
History and Physical    Patient: Nicole [name] OZD:[id number] DOB: 21-Oct-1934 DOA: 10/28/2022 DOS: the patient was seen and examined on 10/28/2022 PCP: Sherrie Sport, MD  Patient coming from: ALF/ILF    Chief Complaint:  Chief Complaint  Patient presents with   Anxiety   HPI: Nicole [name] is a 88 y.o. female with medical history significant of chronic diastolic HF, hypothyroidism, hypertension, hyperlipidemia recently discharged on 10/08/2022 after presenting with acute respiratory failure secondary to pneumonia with underlying history of bronchiectasis , was also found to have acute pulmonary embolism, AKI.  During that admission, patient was intubated and was on pressors.  Patient was eventually stabilized and sent back to ILF.  Today, patient presented to the ED for concern of shortness of breath x 1 day.  She reported enough to go to the kitchen to get something and had an intense feeling of shortness of breath which felt like a panic attack.  Patient was assessed by staff and her ILF, and they recommended her to come to the ER to be evaluated.  In the ED, patient reported her symptoms have completely resolved and denies any further shortness of breath, chest discomfort, fever/chills.  Patient does report some chronic cough with whitish sputum production.  Of note, there is a strong suspicion of mild cognitive impairment while discussing with this patient.  Attempted to contact daughter to x 2 without any luck.  In the ED, patient noted to have AKI and hyponatremia, otherwise vital signs stable.  Patient admitted for further management.    Review of Systems: As mentioned in the history of present illness. All other systems reviewed and are negative.    History reviewed. No pertinent past medical history. History reviewed. No pertinent surgical history. Social History:  has no history on file for tobacco use, alcohol use, and drug use.  Allergies  Allergen Reactions   Codeine Nausea Only  and Other (See Comments)    Dizziness    Penicillins Other (See Comments)    Unknown    History reviewed. No pertinent family history.  Prior to Admission medications   Medication Sig Start Date End Date Taking? Authorizing Provider  acetaminophen (TYLENOL) 325 MG tablet Take 325 mg by mouth as needed for moderate pain or headache.    [provider]  albuterol (VENTOLIN HFA) 108 (90 Base) MCG/ACT inhaler Inhale 2 puffs into the lungs every 4 (four) hours as needed for wheezing or shortness of breath. 08/15/22   [provider]  apixaban (ELIQUIS) 5 MG TABS tablet Take 2 tablets (10 mg total) by mouth 2 (two) times daily. 10/08/22   Dibia, Manfred Shirts, MD  apixaban (ELIQUIS) 5 MG TABS tablet Take 1 tablet (5 mg total) by mouth 2 (two) times daily. 10/11/22   Dibia, Manfred Shirts, MD  atorvastatin (LIPITOR) 20 MG tablet Take 20 mg by mouth daily. 09/13/22   [provider]  fluticasone-salmeterol (ADVAIR) 250-50 MCG/ACT AEPB Inhale 1 puff into the lungs 2 (two) times daily. 07/21/22   [provider]  latanoprost (XALATAN) 0.005 % ophthalmic solution Place 1 drop into both eyes at bedtime. 07/21/22   [provider]  levocetirizine (XYZAL) 5 MG tablet Take 5 mg by mouth at bedtime. 08/27/22   [provider]  levothyroxine (SYNTHROID) 75 MCG tablet Take 75 mcg by mouth daily. 09/13/22   [provider]  lisinopril-hydrochlorothiazide (ZESTORETIC) 20-12.5 MG tablet Take 2 tablets by mouth every morning. 09/18/22   [provider]  meclizine (  ANTIVERT) 25 MG tablet Take 12.5 mg by mouth daily as needed for dizziness. 09/02/22   [provider]  metoprolol succinate (TOPROL-XL) 50 MG 24 hr tablet Take 50 mg by mouth at bedtime. 09/16/22   [provider]  montelukast (SINGULAIR) 10 MG tablet Take 10 mg by mouth daily. 09/13/22   [provider]  pantoprazole (PROTONIX) 40 MG tablet Take 1 tablet (40 mg total)  by mouth 2 (two) times daily. 10/08/22   Dibia, Elgie Collard, MD  SPIRIVA HANDIHALER 18 MCG inhalation capsule Place 18 mcg into inhaler and inhale daily. 07/26/22   [provider]  verapamil (CALAN-SR) 180 MG CR tablet Take 180 mg by mouth at bedtime. 09/13/22   [provider]    Physical Exam: Vitals:   10/28/22 1000 10/28/22 1030 10/28/22 1330 10/28/22 1353  BP: 137/73 (!) 141/77 124/68   Pulse: 67 70 70   Resp: 19  18   Temp:    (!) 97.3 F (36.3 C)  TempSrc:    Oral  SpO2: 100% 100% 98%   Weight:       General: NAD, thin, elderly  Cardiovascular: S1, S2 present Respiratory: CTAB Abdomen: Soft, nontender, nondistended, bowel sounds present Musculoskeletal: No bilateral pedal edema noted Skin: Normal Psychiatry: Normal mood      Data Reviewed: As noted above  Assessment and Plan:  ??HCAP  History of underlying bronchiectasis Afebrile, no leukocytosis Chest x-ray showed potential developing multilobar bronchopneumonia most evident in the left upper lobe, basilar opacities CT chest without contrast to further evaluate if pneumonia versus bronchiectasis BC X 2 pending Urinalysis unremarkable, UC pending Continue cefepime for now Continue nebulizers, inhalers, Singulair Monitor closely in the progressive floor given recent history of PE  AKI Baseline Cr normal, on admission creatinine 2.56 Renal ultrasound with no obstructive uropathy Likely 2/2 lisinopril/HCT, will hold, unsure if patient was on Lasix prior to arrival IV fluids Daily BMP, I's and O's while on fluid  Hyponatremia Continue IV fluids Daily BMP  History of PE Diagnosed on 09/2022 Continue Eliquis  History of chronic diastolic HF Appears euvolemic/dry BNP 371, on last admission was 755 Chest x-ray as above Echo done on 09/2022 showed EF of 55 to 60% with grade 3 diastolic dysfunction Monitor I's and O's  Hypertension BP stable Continue metoprolol, verapamil, hold  lisinopril/HCTZ  Hypothyroidism TSH 10.4, free T4-->1.53 Unsure if patient was taking her medication and not Will continue Synthroid        Advance Care Planning: DNR  Consults: None  Family Communication: Attempted to call daughter twice on 10/28/2022, no answer  Severity of Illness: The appropriate patient status for this patient is OBSERVATION. Observation status is judged to be reasonable and necessary in order to provide the required intensity of service to ensure the patient's safety. The patient's presenting symptoms, physical exam findings, and initial radiographic and laboratory data in the context of their medical condition is felt to place them at decreased risk for further clinical deterioration. Furthermore, it is anticipated that the patient will be medically stable for discharge from the hospital within 2 midnights of admission.   Author: Briant Cedar, MD 10/28/2022 2:08 PM  For on call review [email or web address].

## 2022-10-28 NOTE — Progress Notes (Addendum)
A consult was received from an ED physician for vancomycin per pharmacy dosing (for an indication other than meningitis). The patient's profile has been reviewed for ht/wt/allergies/indication/available labs. After considering new renal injury, allergies, and indication, therapy was discussed with EDP and a one time order has been placed for linezolid.  Further antibiotics/pharmacy consults should be ordered by admitting physician if indicated.                       Reuel Boom, PharmD, BCPS [phone] 10/28/2022, 7:39 AM   PHARMACY NOTE -  Cefepime  Pharmacy has been assisting with dosing of cefepime for PNA. Dosage remains stable at 2g IV q24 hr and further renal adjustments per institutional Pharmacy antibiotic protocol  Pharmacy will sign off, following peripherally for culture results, dose adjustments, and length of therapy. Please reconsult if a change in clinical status warrants re-evaluation of dosage.  Reuel Boom, PharmD, BCPS [phone] 10/28/2022, 9:37 AM

## 2022-10-28 NOTE — ED Notes (Signed)
Patient urine has been sent off. Patient not retaining any urine.

## 2022-10-28 NOTE — ED Triage Notes (Signed)
Pt arrives EMS from an independent living facility with reports of possible anxiety attack. Pt reports she had a moment of feeling anxious and short of breath while sitting in chair at home. Pt reports aching headache at this time and denies other complaints. Pt alert and oriented.

## 2022-10-28 NOTE — ED Provider Notes (Signed)
[facility]-EMERGENCY DEPT Provider Note   CSN: [id number] Arrival date & time: 10/28/22  0349     History  Chief Complaint  Patient presents with   Anxiety    Nicole [name] is a 88 y.o. female.  88 year old female brought in by EMS from facility with concern for shortness of breath.  Patient reports onset of symptoms last night while lying in bed.  She got out of bed and moved to the recliner and her symptoms resolved.  Patient was assessed by staff, given her recent hospitalization including diagnosis of PE (on Eliquis), recommend that she come to the ER to be evaluated.  Patient reports that her symptoms have resolved, she has no chest discomfort or shortness of breath, fevers, chills, cough.       Home Medications Prior to Admission medications   Medication Sig Start Date End Date Taking? Authorizing Provider  acetaminophen (TYLENOL) 325 MG tablet Take 325 mg by mouth as needed for moderate pain or headache.    [provider]  albuterol (VENTOLIN HFA) 108 (90 Base) MCG/ACT inhaler Inhale 2 puffs into the lungs every 4 (four) hours as needed for wheezing or shortness of breath. 08/15/22   [provider]  apixaban (ELIQUIS) 5 MG TABS tablet Take 2 tablets (10 mg total) by mouth 2 (two) times daily. 10/08/22   Dibia, Elgie Collard, MD  apixaban (ELIQUIS) 5 MG TABS tablet Take 1 tablet (5 mg total) by mouth 2 (two) times daily. 10/11/22   Dibia, Elgie Collard, MD  atorvastatin (LIPITOR) 20 MG tablet Take 20 mg by mouth daily. 09/13/22   [provider]  fluticasone-salmeterol (ADVAIR) 250-50 MCG/ACT AEPB Inhale 1 puff into the lungs 2 (two) times daily. 07/21/22   [provider]  latanoprost (XALATAN) 0.005 % ophthalmic solution Place 1 drop into both eyes at bedtime. 07/21/22   [provider]  levocetirizine (XYZAL) 5 MG tablet Take 5 mg by mouth at bedtime. 08/27/22   [provider]  levothyroxine (SYNTHROID) 75  MCG tablet Take 75 mcg by mouth daily. 09/13/22   [provider]  lisinopril-hydrochlorothiazide (ZESTORETIC) 20-12.5 MG tablet Take 2 tablets by mouth every morning. 09/18/22   [provider]  meclizine (ANTIVERT) 25 MG tablet Take 12.5 mg by mouth daily as needed for dizziness. 09/02/22   [provider]  metoprolol succinate (TOPROL-XL) 50 MG 24 hr tablet Take 50 mg by mouth at bedtime. 09/16/22   [provider]  montelukast (SINGULAIR) 10 MG tablet Take 10 mg by mouth daily. 09/13/22   [provider]  pantoprazole (PROTONIX) 40 MG tablet Take 1 tablet (40 mg total) by mouth 2 (two) times daily. 10/08/22   Dibia, Elgie Collard, MD  SPIRIVA HANDIHALER 18 MCG inhalation capsule Place 18 mcg into inhaler and inhale daily. 07/26/22   [provider]  verapamil (CALAN-SR) 180 MG CR tablet Take 180 mg by mouth at bedtime. 09/13/22   [provider]      Allergies    Codeine and Penicillins    Review of Systems   Review of Systems Level 5 caveat for dementia  Physical Exam Updated Vital Signs BP (!) 141/77   Pulse 70   Temp (!) 97.5 F (36.4 C) (Oral)   Resp 19   Wt 47.2 kg   SpO2 100%   BMI 19.02 kg/m  Physical Exam Vitals and nursing note reviewed.  Constitutional:      Appearance: Normal appearance.  HENT:  Head: Normocephalic and atraumatic.     Mouth/Throat:     Mouth: Mucous membranes are moist.  Eyes:     Conjunctiva/sclera: Conjunctivae normal.  Cardiovascular:     Rate and Rhythm: Normal rate and regular rhythm.     Heart sounds: Normal heart sounds.  Pulmonary:     Effort: Pulmonary effort is normal.     Breath sounds: Normal breath sounds.  Abdominal:     Palpations: Abdomen is soft.     Tenderness: There is no abdominal tenderness.  Musculoskeletal:     Cervical back: Neck supple.     Right lower leg: No edema.     Left lower leg: No edema.  Skin:    General: Skin is warm and dry.      Findings: No erythema or rash.  Neurological:     General: No focal deficit present.     Mental Status: She is alert.     Sensory: No sensory deficit.     Motor: No weakness.  Psychiatric:        Behavior: Behavior normal.     ED Results / Procedures / Treatments   Labs (all labs ordered are listed, but only abnormal results are displayed) Labs Reviewed  COMPREHENSIVE METABOLIC PANEL - Abnormal; Notable for the following components:      Result Value   Sodium 127 (*)    Chloride 96 (*)    CO2 21 (*)    Glucose, Bld 105 (*)    BUN 34 (*)    Creatinine, Ser 2.56 (*)    GFR, Estimated 18 (*)    All other components within normal limits  CBC WITH DIFFERENTIAL/PLATELET - Abnormal; Notable for the following components:   RBC 3.30 (*)    Hemoglobin 9.4 (*)    HCT 28.4 (*)    All other components within normal limits  TSH - Abnormal; Notable for the following components:   TSH 10.437 (*)    All other components within normal limits  URINALYSIS, ROUTINE W REFLEX MICROSCOPIC - Abnormal; Notable for the following components:   Color, Urine STRAW (*)    All other components within normal limits  PROTIME-INR - Abnormal; Notable for the following components:   Prothrombin Time 17.3 (*)    INR 1.4 (*)    All other components within normal limits  BRAIN NATRIURETIC PEPTIDE - Abnormal; Notable for the following components:   B Natriuretic Peptide 371.9 (*)    All other components within normal limits  RESP PANEL BY RT-PCR (RSV, FLU A&B, COVID)  RVPGX2  CULTURE, BLOOD (ROUTINE X 2)  CULTURE, BLOOD (ROUTINE X 2)  URINE CULTURE  MRSA NEXT GEN BY PCR, NASAL  LACTIC ACID, PLASMA  APTT  T4, FREE  BASIC METABOLIC PANEL  TROPONIN I (HIGH SENSITIVITY)  TROPONIN I (HIGH SENSITIVITY)    EKG EKG Interpretation  Date/Time:  Tuesday October 28 2022 05:01:09 EST Ventricular Rate:  48 PR Interval:  159 QRS Duration: 152 QT Interval:  488 QTC Calculation: 436 R Axis:   77 Text  Interpretation: Sinus bradycardia Right bundle branch block No significant change since last tracing Confirmed by Vivien Rossetti ([id number]) on 10/28/2022 5:49:50 AM  Radiology US RENAL  Result Date: 10/28/2022 CLINICAL DATA:  [id number] Renal failure [id number] EXAM: RENAL / URINARY TRACT ULTRASOUND COMPLETE COMPARISON:  None Available. FINDINGS: The right kidney measured 9.7 cm and the left kidney measured 8.1 cm. The kidneys demonstrate increased echogenicity consistent with chronic medical renal disease. No renal parenchymal  lesions are identified. No shadowing stones are seen. The urinary bladder appeared unremarkable. Bladder prevoid volume was 460 mL. Postvoid images not obtained. IMPRESSION: Echogenic kidneys consistent with chronic medical renal disease. Otherwise unremarkable examination of the kidneys and urinary bladder. Electronically Signed   By: Sammie Bench M.D.   On: 10/28/2022 10:33   DG Chest 2 View  Result Date: 10/28/2022 CLINICAL DATA:  88 year old female with history of dyspnea. EXAM: CHEST - 2 VIEW COMPARISON:  Chest x-ray 10/02/2022. FINDINGS: Mild elevation of the left hemidiaphragm. Ill-defined bibasilar opacities which may reflect areas of atelectasis and/or consolidation. Patchy areas of interstitial prominence and peribronchial cuffing in the lungs bilaterally, along with some ill-defined opacities most evident in the left upper lobe, concerning for potential bronchitis and developing multilobar bronchopneumonia. No pneumothorax. No evidence of pulmonary edema. Heart size is normal. Upper mediastinal contours are within normal limits. Atherosclerosis in the thoracic aorta. IMPRESSION: 1. The appearance of the chest suggests bronchitis, with potential developing multilobar bronchopneumonia most evident left upper lobe. 2. Bibasilar opacities are also noted, which may reflect additional areas of atelectasis and/or consolidation. 3. Aortic atherosclerosis. Electronically Signed   By:  Vinnie Langton M.D.   On: 10/28/2022 06:53    Procedures Procedures    Medications Ordered in ED Medications  0.9 %  sodium chloride infusion ( Intravenous Rate/Dose Change 10/28/22 1039)  ceFEPIme (MAXIPIME) 2 g in sodium chloride 0.9 % 100 mL IVPB (has no administration in time range)  albuterol (PROVENTIL) (2.5 MG/3ML) 0.083% nebulizer solution 2.5 mg (has no administration in time range)  apixaban (ELIQUIS) tablet 5 mg (has no administration in time range)  fluticasone furoate-vilanterol (BREO ELLIPTA) 200-25 MCG/ACT 1 puff (has no administration in time range)  levothyroxine (SYNTHROID) tablet 75 mcg (has no administration in time range)  metoprolol succinate (TOPROL-XL) 24 hr tablet 50 mg (has no administration in time range)  montelukast (SINGULAIR) tablet 10 mg (has no administration in time range)  pantoprazole (PROTONIX) EC tablet 40 mg (has no administration in time range)  tiotropium (SPIRIVA) inhalation capsule ([facility] use ONLY) 18 mcg (has no administration in time range)  verapamil (CALAN-SR) CR tablet 180 mg (has no administration in time range)  acetaminophen (TYLENOL) tablet 1,000 mg (1,000 mg Oral Given 10/28/22 0609)  ceFEPIme (MAXIPIME) 1 g in sodium chloride 0.9 % 100 mL IVPB (1 g Intravenous New Bag/Given 10/28/22 0801)  sodium chloride 0.9 % bolus 500 mL (500 mLs Intravenous New Bag/Given 10/28/22 0746)  linezolid (ZYVOX) IVPB 600 mg (600 mg Intravenous New Bag/Given 10/28/22 0753)    ED Course/ Medical Decision Making/ A&P                           Medical Decision Making Amount and/or Complexity of Data Reviewed Labs: ordered. Radiology: ordered.  Risk OTC drugs.   This patient presents to the ED for concern of shortness of breath, this involves an extensive number of treatment options, and is a complaint that carries with it a high risk of complications and morbidity.  The differential diagnosis includes but not limited to pna, PE, CHF, UTI   Co morbidities  that complicate the patient evaluation  PE, CHF, AKI, PNA, hypothyroid   Additional history obtained:  External records from outside source obtained and reviewed including dc summary for admission from 09/29/22-10/08/22, admission for septic shock, AKI, PE, PNA   Lab Tests:  I Ordered, and personally interpreted labs.  The pertinent results include:  CBC with normal WBC, hgb 9.4, compared to prior on file. Lactic acid normal/0.9. INR 1.4. BNP mildly elevated at 371.9. TSH elevated at 10.437   Imaging Studies ordered:  I ordered imaging studies including CXR  I independently visualized and interpreted imaging which showed concern for PNA I agree with the radiologist interpretation   Cardiac Monitoring: / EKG:  The patient was maintained on a cardiac monitor.  I personally viewed and interpreted the cardiac monitored which showed an underlying rhythm of: sinus brady, rate 48   Consultations Obtained:  I requested consultation with the hospitalist, Dr. Sharolyn Douglas,  and discussed lab and imaging findings as well as pertinent plan - they recommend: request bladder scan, renal US, will admit   Problem List / ED Course / Critical interventions / Medication management  88 year old female brought in by EMS from facility with concern for [facility] today. At time of exam, symptoms had resolved. Per chart review from prior admission last month, question degree of dementia. Patient is able to tell me about feeling short of breath last night and being evaluated by the staff at her facility, she is aware of her recent hospitalization and diagnosis of PE. She is not a great historian otherwise. Lungs clear, abdomen soft and non tender (on recheck, bladder it not palpated/distended), no lower extremity edema. AKI, provided with gentle fluid bolus given recent diagnosis CHF. Concern for PNA on CXR, ordered abx for CAP, discussed with pharmacy for guidance with AKI. Lactic acid negative. Consult to  hospitalist who requests bladder scan for retention causing AKI and renal US, will admit.  I ordered medication including IVF, antibiotics  for AKI, PNA  Reevaluation of the patient after these medicines showed that the patient stayed the same I have reviewed the patients home medicines and have made adjustments as needed   Social Determinants of Health:  Lives at facility    Test / Admission - Considered:  Admit for further monitoring and management          Final Clinical Impression(s) / ED Diagnoses Final diagnoses:  AKI (acute kidney injury) (HCC)  Pneumonia due to infectious organism, unspecified laterality, unspecified part of lung    Rx / DC Orders ED Discharge Orders     None         Jeannie Fend, PA-C 10/28/22 1307    Ernie Avena, MD 10/28/22 1630

## 2022-10-28 NOTE — ED Notes (Signed)
Pt ambulatory to restroom with staff assistance.

## 2022-10-28 NOTE — ED Notes (Signed)
Pt voided in toilet but missed toilet hat for urine specimen, will monitor.

## 2022-10-29 DIAGNOSIS — D649 Anemia, unspecified: Secondary | ICD-10-CM | POA: Diagnosis not present

## 2022-10-29 DIAGNOSIS — I110 Hypertensive heart disease with heart failure: Secondary | ICD-10-CM | POA: Diagnosis present

## 2022-10-29 DIAGNOSIS — Z66 Do not resuscitate: Secondary | ICD-10-CM | POA: Diagnosis present

## 2022-10-29 DIAGNOSIS — Z86711 Personal history of pulmonary embolism: Secondary | ICD-10-CM | POA: Diagnosis not present

## 2022-10-29 DIAGNOSIS — N179 Acute kidney failure, unspecified: Secondary | ICD-10-CM | POA: Diagnosis not present

## 2022-10-29 DIAGNOSIS — J189 Pneumonia, unspecified organism: Secondary | ICD-10-CM | POA: Diagnosis not present

## 2022-10-29 DIAGNOSIS — Z7901 Long term (current) use of anticoagulants: Secondary | ICD-10-CM | POA: Diagnosis not present

## 2022-10-29 DIAGNOSIS — Z7951 Long term (current) use of inhaled steroids: Secondary | ICD-10-CM | POA: Diagnosis not present

## 2022-10-29 DIAGNOSIS — F419 Anxiety disorder, unspecified: Secondary | ICD-10-CM | POA: Diagnosis present

## 2022-10-29 DIAGNOSIS — Z681 Body mass index (BMI) 19 or less, adult: Secondary | ICD-10-CM | POA: Diagnosis not present

## 2022-10-29 DIAGNOSIS — Z880 Allergy status to penicillin: Secondary | ICD-10-CM | POA: Diagnosis not present

## 2022-10-29 DIAGNOSIS — Z79890 Hormone replacement therapy (postmenopausal): Secondary | ICD-10-CM | POA: Diagnosis not present

## 2022-10-29 DIAGNOSIS — E785 Hyperlipidemia, unspecified: Secondary | ICD-10-CM | POA: Diagnosis present

## 2022-10-29 DIAGNOSIS — I2510 Atherosclerotic heart disease of native coronary artery without angina pectoris: Secondary | ICD-10-CM | POA: Diagnosis present

## 2022-10-29 DIAGNOSIS — I700 Atherosclerosis of aorta: Secondary | ICD-10-CM | POA: Diagnosis present

## 2022-10-29 DIAGNOSIS — J470 Bronchiectasis with acute lower respiratory infection: Secondary | ICD-10-CM | POA: Diagnosis present

## 2022-10-29 DIAGNOSIS — Z885 Allergy status to narcotic agent status: Secondary | ICD-10-CM | POA: Diagnosis not present

## 2022-10-29 DIAGNOSIS — T464X5A Adverse effect of angiotensin-converting-enzyme inhibitors, initial encounter: Secondary | ICD-10-CM | POA: Diagnosis present

## 2022-10-29 DIAGNOSIS — R636 Underweight: Secondary | ICD-10-CM | POA: Diagnosis present

## 2022-10-29 DIAGNOSIS — Z79899 Other long term (current) drug therapy: Secondary | ICD-10-CM | POA: Diagnosis not present

## 2022-10-29 DIAGNOSIS — E871 Hypo-osmolality and hyponatremia: Secondary | ICD-10-CM | POA: Diagnosis not present

## 2022-10-29 DIAGNOSIS — I5032 Chronic diastolic (congestive) heart failure: Secondary | ICD-10-CM | POA: Diagnosis present

## 2022-10-29 DIAGNOSIS — E039 Hypothyroidism, unspecified: Secondary | ICD-10-CM | POA: Diagnosis present

## 2022-10-29 DIAGNOSIS — J439 Emphysema, unspecified: Secondary | ICD-10-CM | POA: Diagnosis present

## 2022-10-29 DIAGNOSIS — Z1152 Encounter for screening for COVID-19: Secondary | ICD-10-CM | POA: Diagnosis not present

## 2022-10-29 LAB — CBC
HCT: 26.4 % — ABNORMAL LOW (ref 36.0–46.0)
Hemoglobin: 8.8 g/dL — ABNORMAL LOW (ref 12.0–15.0)
MCH: 28.9 pg (ref 26.0–34.0)
MCHC: 33.3 g/dL (ref 30.0–36.0)
MCV: 86.6 fL (ref 80.0–100.0)
Platelets: 245 10*3/uL (ref 150–400)
RBC: 3.05 MIL/uL — ABNORMAL LOW (ref 3.87–5.11)
RDW: 14.7 % (ref 11.5–15.5)
WBC: 5.6 10*3/uL (ref 4.0–10.5)
nRBC: 0 % (ref 0.0–0.2)

## 2022-10-29 LAB — BASIC METABOLIC PANEL
Anion gap: 6 (ref 5–15)
BUN: 26 mg/dL — ABNORMAL HIGH (ref 8–23)
CO2: 25 mmol/L (ref 22–32)
Calcium: 9 mg/dL (ref 8.9–10.3)
Chloride: 105 mmol/L (ref 98–111)
Creatinine, Ser: 1.52 mg/dL — ABNORMAL HIGH (ref 0.44–1.00)
GFR, Estimated: 33 mL/min — ABNORMAL LOW (ref >60)
Glucose, Bld: 85 mg/dL (ref 70–99)
Potassium: 4 mmol/L (ref 3.5–5.1)
Sodium: 136 mmol/L (ref 135–145)

## 2022-10-29 LAB — URINE CULTURE: Culture: NO GROWTH

## 2022-10-29 MED ORDER — SENNOSIDES-DOCUSATE SODIUM 8.6-50 MG PO TABS
1.0000 | ORAL_TABLET | Freq: Two times a day (BID) | ORAL | Status: DC
  Administered 2022-10-29 – 2022-10-31 (×4): 1 via ORAL
  Filled 2022-10-29 (×4): qty 1

## 2022-10-29 MED ORDER — POLYETHYLENE GLYCOL 3350 17 G PO PACK
17.0000 g | PACK | Freq: Two times a day (BID) | ORAL | Status: DC
  Administered 2022-10-29 – 2022-10-31 (×4): 17 g via ORAL
  Filled 2022-10-29 (×4): qty 1

## 2022-10-29 MED ORDER — SODIUM CHLORIDE 0.9 % IV SOLN: INTRAVENOUS | Status: AC

## 2022-10-29 MED ORDER — BISACODYL 10 MG RE SUPP: 10.0000 mg | Freq: Every day | RECTAL | Status: DC | PRN

## 2022-10-29 NOTE — Evaluation (Signed)
Physical Therapy Evaluation Patient Details Name: Nicole [name] MRN: [id number] DOB: January 22, 1935 Today's Date: 10/29/2022  History of Present Illness  88 y.o. female with medical history significant of chronic diastolic HF, hypothyroidism, hypertension, hyperlipidemia recently discharged on 10/08/2022 after presenting with acute respiratory failure secondary to pneumonia with underlying history of bronchiectasis , was also found to have acute pulmonary embolism, AKI. Presented to ED with sudden [facility]. Patient admitted for possible pneumona and found to have AKI and hyponatremia   Clinical Impression  On eval, pt was Min guard A for mobility. She walked ~150 feet with a rollator. She is mildly unsteady at times. O2.90% on RA-she tolerated activity well. Will plan to follow pt during this hospital stay. Recommend HHPT f/u.       Recommendations for follow up therapy are one component of a multi-disciplinary discharge planning process, led by the attending physician.  Recommendations may be updated based on patient status, additional functional criteria and insurance authorization.  Follow Up Recommendations Home health PT Can patient physically be transported by private vehicle: Yes    Assistance Recommended at Discharge Intermittent Supervision/Assistance  Patient can return home with the following  A little help with walking and/or transfers;A little help with bathing/dressing/bathroom;Assistance with cooking/housework;Assist for transportation;Direct supervision/assist for medications management;Direct supervision/assist for financial management;Help with stairs or ramp for entrance    Equipment Recommendations None recommended by PT  Recommendations for Other Services       Functional Status Assessment Patient has had a recent decline in their functional status and demonstrates the ability to make significant improvements in function in a reasonable and predictable amount of time.      Precautions / Restrictions Precautions Precautions: Fall Restrictions Weight Bearing Restrictions: No      Mobility  Bed Mobility               General bed mobility comments: oob in recliner    Transfers Overall transfer level: Needs assistance Equipment used: Rolling walker (2 wheels) Transfers: Sit to/from Stand Sit to Stand: Supervision           General transfer comment: Supv for safety    Ambulation/Gait Ambulation/Gait assistance: Min guard Gait Distance (Feet): 150 Feet Assistive device: Rollator (4 wheels) Gait Pattern/deviations: Step-through pattern, Decreased stride length       General Gait Details: Min guard for safety. No overt LOB with RW use. Pt was able to converse throughout ambulation distance without dyspnea. O2 >90% on RA  Stairs            Wheelchair Mobility    Modified Rankin (Stroke Patients Only)       Balance Overall balance assessment: Needs assistance         Standing balance support: No upper extremity supported, During functional activity, Reliant on assistive device for balance Standing balance-Leahy Scale: Fair                               Pertinent Vitals/Pain Pain Assessment Pain Assessment: No/denies pain    Home Living Family/patient expects to be discharged to:: Assisted living                 Home Equipment: Conservation officer, nature (2 wheels) Additional Comments: Resident at [employer], lives alone in apartment; meals provided by [employer]. Daughter visits near daily    Prior Function Prior Level of Function : Independent/Modified Independent  Mobility Comments: mod indep ambulating with RW. ADLs Comments: reports mod indep standing to shower. meals provided by facility     Hand Dominance   Dominant Hand: Right    Extremity/Trunk Assessment   Upper Extremity Assessment Upper Extremity Assessment: Defer to OT evaluation    Lower Extremity  Assessment Lower Extremity Assessment: Generalized weakness    Cervical / Trunk Assessment Cervical / Trunk Assessment: Normal  Communication   Communication: No difficulties  Cognition Arousal/Alertness: Awake/alert Behavior During Therapy: WFL for tasks assessed/performed Overall Cognitive Status: History of cognitive impairments - at baseline                                 General Comments: pt reports she has a hx of some forgetfulness        General Comments      Exercises     Assessment/Plan    PT Assessment Patient needs continued PT services  PT Problem List Decreased strength;Decreased activity tolerance;Decreased balance;Decreased mobility;Decreased cognition;Decreased safety awareness;Cardiopulmonary status limiting activity       PT Treatment Interventions DME instruction;Gait training;Functional mobility training;Therapeutic activities;Therapeutic exercise;Balance training;Patient/family education    PT Goals (Current goals can be found in the Care Plan section)  Acute Rehab PT Goals Patient Stated Goal: return to ILF apt PT Goal Formulation: With patient Time For Goal Achievement: 11/12/22 Potential to Achieve Goals: Good    Frequency Min 3X/week     Co-evaluation               AM-PAC PT "6 Clicks" Mobility  Outcome Measure Help needed turning from your back to your side while in a flat bed without using bedrails?: A Little Help needed moving from lying on your back to sitting on the side of a flat bed without using bedrails?: A Little Help needed moving to and from a bed to a chair (including a wheelchair)?: A Little Help needed standing up from a chair using your arms (e.g., wheelchair or bedside chair)?: A Little Help needed to walk in hospital room?: A Little Help needed climbing 3-5 steps with a railing? : A Little 6 Click Score: 18    End of Session Equipment Utilized During Treatment: Gait belt Activity Tolerance:  Patient tolerated treatment well Patient left: in chair;with call bell/phone within reach;with family/visitor present   PT Visit Diagnosis: Muscle weakness (generalized) (M62.81);Difficulty in walking, not elsewhere classified (R26.2)    Time: 0017-4944 PT Time Calculation (min) (ACUTE ONLY): 19 min   Charges:   PT Evaluation $PT Eval Low Complexity: [place], PT Acute Rehabilitation  Office: [phone]

## 2022-10-29 NOTE — Progress Notes (Signed)
PROGRESS NOTE    Nicole [name]  KZS:[id number] DOB: 14-Oct-1935 DOA: 10/28/2022 PCP: Sherrie Sport, MD   Brief Narrative:  Patient is an 88 year old underweight chronically ill-appearing Caucasian female with past medical history significant for but not limited to chronic diastolic CHF, hypothyroidism, hypertension, hyperlipidemia as well as other comorbidities who was recently discharged on 10/09/2019 after presenting with acute respiratory failure with secondary to pneumonia with underlying history of bronchiectasis and was found to have an acute pulmonary embolism as well as AKI at that time.  During that admission the patient was intubated and was on pressors.  She eventually stabilized and was sent back to ILF.  Yesterday she presented to the ED with concerns of shortness of breath for day and reports that she went to go get something in the kitchen and had an intense feeling of shortness of breath and felt like she was having a panic attack.  Patient was assessed by the staff at her ILF and they recommended her come to the ER to be evaluated.  In the ED patient reported symptoms completely resolved and denied any further shortness of breath, chest discomfort fevers or chills.  She does report some chronic cough with whitish sputum but of note there was some strong suspicion of mild cognitive impairment.  In the ED she was noted to have an AKI and hyponatremia and was admitted for evaluation and management.  **Interim History She is getting IVF Hydration and Renal Fxn is improving. She will need an ambulatory home O2 screen prior to D/C.   Assessment and Plan: No notes have been filed under this hospital service. Service: Hospitalist  ??HCAP, ruled out  History of underlying bronchiectasis -Afebrile, no leukocytosis -Chest x-ray showed potential developing multilobar bronchopneumonia most evident in the left upper lobe, basilar opacities -CT chest without contrast done and showed "Persistent but  improving opacification over the right lower lobe with mild volume loss as findings may be due to improving atelectasis or infection. Resolved small right effusion. Significant improvement with moderate clearing of the previously seen material within the left mainstem bronchus and lower lobe bronchi. Improved left base opacification. Recommend follow-up to resolution. Few nodular densities within the left mid to lower lung without significant change. Stable mediastinal adenopathy as described which may be reactive. Findings may be due to an infectious, atypical infectious or inflammatory process and may be related to patient's known aspiration. Recommend additional follow-up CT in 6 weeks. Mild emphysematous disease with mild bibasilar bronchiectasis. Aortic atherosclerosis. Atherosclerotic coronary artery disease. Aortic Atherosclerosis and Emphysema " -BC X 2 showed NGTD at <24 hours -Urinalysis unremarkable, UC showing No Growth -Was initiated on IV Cefepime but since afebrile and has no Leukocytosis or symptoms will observe off of Antibiotics; Given Zyvox yesterday -Continue nebulizers with DuoNeb q2hprn Wheezing and SOB, Inhalers with Breo-Ellipta 1 puff IH Daily and Incruse Ellipta 1 puff IH Daily,  -Initiated Montelukast 10 mg po Daily  -Continue to Monitor closely in the progressive floor given recent history of PE   AKI -Baseline Cr normal, on admission creatinine 2.56 -Renal ultrasound with no obstructive uropathy -Likely 2/2 lisinopril/HCTZ, will hold, unsure if patient was on Lasix prior to arrival -C/w Gentle IVF hydration with NS at 75 mL/hr -BUN/Cr Trend: Recent Labs  Lab 10/04/22 0134 10/05/22 0134 10/06/22 0600 10/08/22 0151 10/28/22 0534 10/28/22 1325 10/29/22 0411  BUN 25* 20 21 19  34* 31* 26*  CREATININE 0.85 0.81 0.85 0.80 2.56* 2.07* 1.52*  -Strict I's and O's and  Daily Weights   -Avoid Nephrotoxic Medications, Contrast Dyes, Hypotension and Dehydration to Ensure  Adequate Renal Perfusion and will need to Renally Adjust Meds -Continue to Monitor and Trend Renal Function carefully and repeat CMP in the AM   Hyponatremia -C/w IVF Hydration as above -Na+ Trend Recent Labs  Lab 10/04/22 0134 10/05/22 0134 10/06/22 0600 10/08/22 0151 10/28/22 0534 10/28/22 1325 10/29/22 0411  NA 137 140 137 141 127* 130* 136  -Continue to Monitor and Trend and repeat CMP in the AM    History of PE -Diagnosed on 09/2022 -Continue Anticoagulation with Apixaban 5 mg po BID    History of chronic diastolic HF -Appears euvolemic/dry -BNP 371, on last admission was 755 -Chest x-ray done and showed "The appearance of the chest suggests bronchitis, with potential developing multilobar bronchopneumonia most evident left upper lobe. Bibasilar opacities are also noted, which may reflect additional areas of atelectasis and/or consolidation. Aortic atherosclerosis." -Echo done on 09/2022 showed EF of 55 to 60% with grade 3 diastolic dysfunction -Strict I's and O's and Daily Weights    Hypertension -BP stable -Continue Metoprolol Succinate 50 mg po qHS, Verapamil 180 mg po qHS, and will continue to hold lisinopril/HCTZ given renal Dysfunction -Continue to Monitor BP per Protocol -Last BP reading was 103/63   Hypothyroidism -TSH 10.4, free T4-->1.53 -Unsure if patient was taking her medication and not -Will continue Levothyroxine 75 mcg po Daily  -Will need Repeat TFTs in 4-6 weeks   Normocytic Anemia -Hgb/Hct Trend: Recent Labs  Lab 10/06/22 0600 10/06/22 1822 10/07/22 0214 10/07/22 1829 10/08/22 0151 10/28/22 0534 10/29/22 0411  HGB 7.9* 8.4* 7.8* 8.8* 8.3* 9.4* 8.8*  HCT 24.1* 26.1* 23.7* 27.3* 25.6* 28.4* 26.4*  MCV 87.3  --  86.5  --  88.0 86.1 86.6  -Check Anemia Panel in the AM -Continue to Monitor for S/Sx of Bleeding; No overt bleeding noted -Repeat CBC in the AM   Underweight -Estimated body mass index is 17.29 kg/m as calculated from the  following:   Height as of this encounter: 5\' 3"  (1.6 m).   Weight as of this encounter: 44.3 kg.  DVT prophylaxis: apixaban (ELIQUIS) tablet 5 mg    Code Status: Prior Family Communication: No family present at bedside and called Patient's Daughter with no answer  Disposition Plan:  Level of care: Telemetry Status is: Observation The patient will require care spanning > 2 midnights and should be moved to inpatient because: Will resume IVF Hydration and will need PT evaluation and Ambulatroy O2 Screen in the AM    Consultants:  None  Procedures:  As Delineated as above   Antimicrobials:  Anti-infectives (From admission, onward)    Start     Dose/Rate Route Frequency Ordered Stop   10/29/22 0800  ceFEPIme (MAXIPIME) 2 g in sodium chloride 0.9 % 100 mL IVPB        2 g 200 mL/hr over 30 Minutes Intravenous Every 24 hours 10/28/22 0936     10/28/22 0745  linezolid (ZYVOX) IVPB 600 mg  Status:  Discontinued        600 mg 300 mL/hr over 60 Minutes Intravenous  Once 10/28/22 0737 10/28/22 0737   10/28/22 0745  linezolid (ZYVOX) IVPB 600 mg        600 mg 300 mL/hr over 60 Minutes Intravenous  Once 10/28/22 0738 10/28/22 1500   10/28/22 0730  ceFEPIme (MAXIPIME) 1 g in sodium chloride 0.9 % 100 mL IVPB  1 g 200 mL/hr over 30 Minutes Intravenous  Once 10/28/22 9371 10/28/22 0831       Subjective: Seen and examined at bedside and thinks that she is doing better.  She denies any nausea or vomiting.  Denies any shortness of breath or cough.  Feels okay.  No other concerns or complaints at this time.  Objective: Vitals:   10/28/22 2132 10/29/22 0247 10/29/22 0629 10/29/22 0848  BP:  106/62 123/78   Pulse:  88 65   Resp:  17 18   Temp:  97.8 F (36.6 C)    TempSrc:  Oral    SpO2:  100% 98% 99%  Weight: 44.3 kg     Height: 5\' 3"  (1.6 m)       Intake/Output Summary (Last 24 hours) at 10/29/2022 0858 Last data filed at 10/29/2022 0534 Gross per 24 hour  Intake 1301.94  ml  Output 3 ml  Net 1298.94 ml   Filed Weights   10/28/22 0736 10/28/22 2132  Weight: 47.2 kg 44.3 kg   Examination: Physical Exam:  Constitutional: Thin Caucasian female in NAD  Respiratory: Diminished to auscultation bilaterally, no wheezing, rales, rhonchi or crackles. Normal respiratory effort and patient is not tachypenic. No accessory muscle use. Unlabored breathing  Cardiovascular: RRR, no murmurs / rubs / gallops. S1 and S2 auscultated. No extremity edema. Abdomen: Soft, non-tender, non-distended. Bowel sounds positive.  GU: Deferred. Musculoskeletal: No clubbing / cyanosis of digits/nails. No joint deformity upper and lower extremities.  Skin: No rashes, lesions, ulcers on a limited skin evaluation. No induration; Warm and dry.  Neurologic: CN 2-12 grossly intact with no focal deficits. Romberg sign and cerebellar reflexes not assessed.  Psychiatric: Normal judgment and insight. Alert and oriented x 3. Normal mood and appropriate affect.   Data Reviewed: I have personally reviewed following labs and imaging studies  CBC: Recent Labs  Lab 10/28/22 0534 10/29/22 0411  WBC 8.8 5.6  NEUTROABS 6.5  --   HGB 9.4* 8.8*  HCT 28.4* 26.4*  MCV 86.1 86.6  PLT 314 245   Basic Metabolic Panel: Recent Labs  Lab 10/28/22 0534 10/28/22 1325 10/29/22 0411  NA 127* 130* 136  K 4.4 3.8 4.0  CL 96* 98 105  CO2 21* 23 25  GLUCOSE 105* 98 85  BUN 34* 31* 26*  CREATININE 2.56* 2.07* 1.52*  CALCIUM 9.5 8.8* 9.0   GFR: Estimated Creatinine Clearance: 18.9 mL/min (A) (by C-G formula based on SCr of 1.52 mg/dL (H)). Liver Function Tests: Recent Labs  Lab 10/28/22 0534  AST 18  ALT 14  ALKPHOS 75  BILITOT 0.5  PROT 6.7  ALBUMIN 3.7   No results for input(s): "LIPASE", "AMYLASE" in the last 168 hours. No results for input(s): "AMMONIA" in the last 168 hours. Coagulation Profile: Recent Labs  Lab 10/28/22 0735  INR 1.4*   Cardiac Enzymes: No results for input(s):  "CKTOTAL", "CKMB", "CKMBINDEX", "TROPONINI" in the last 168 hours. BNP (last 3 results) No results for input(s): "PROBNP" in the last 8760 hours. HbA1C: No results for input(s): "HGBA1C" in the last 72 hours. CBG: No results for input(s): "GLUCAP" in the last 168 hours. Lipid Profile: No results for input(s): "CHOL", "HDL", "LDLCALC", "TRIG", "CHOLHDL", "LDLDIRECT" in the last 72 hours. Thyroid Function Tests: Recent Labs    10/28/22 0534  TSH 10.437*  FREET4 1.53*   Anemia Panel: No results for input(s): "VITAMINB12", "FOLATE", "FERRITIN", "TIBC", "IRON", "RETICCTPCT" in the last 72 hours. Sepsis Labs: Recent Labs  Lab 10/28/22 0735  LATICACIDVEN 0.9    Recent Results (from the past 240 hour(s))  Resp panel by RT-PCR (RSV, Flu A&B, Covid) Anterior Nasal Swab     Status: None   Collection Time: 10/28/22  5:54 AM   Specimen: Anterior Nasal Swab  Result Value Ref Range Status   SARS Coronavirus 2 by RT PCR NEGATIVE NEGATIVE Final    Comment: (NOTE) SARS-CoV-2 target nucleic acids are NOT DETECTED.  The SARS-CoV-2 RNA is generally detectable in upper respiratory specimens during the acute phase of infection. The lowest concentration of SARS-CoV-2 viral copies this assay can detect is 138 copies/mL. A negative result does not preclude SARS-Cov-2 infection and should not be used as the sole basis for treatment or other patient management decisions. A negative result may occur with  improper specimen collection/handling, submission of specimen other than nasopharyngeal swab, presence of viral mutation(s) within the areas targeted by this assay, and inadequate number of viral copies(<138 copies/mL). A negative result must be combined with clinical observations, patient history, and epidemiological information. The expected result is Negative.  Fact Sheet for Patients:  BloggerCourse.com  Fact Sheet for Healthcare Providers:   SeriousBroker.it  This test is no t yet approved or cleared by the [place] FDA and  has been authorized for detection and/or diagnosis of SARS-CoV-2 by FDA under an Emergency Use Authorization (EUA). This EUA will remain  in effect (meaning this test can be used) for the duration of the COVID-19 declaration under Section 564(b)(1) of the Act, 21 U.S.C.section 360bbb-3(b)(1), unless the authorization is terminated  or revoked sooner.       Influenza A by PCR NEGATIVE NEGATIVE Final   Influenza B by PCR NEGATIVE NEGATIVE Final    Comment: (NOTE) The Xpert Xpress SARS-CoV-2/FLU/RSV plus assay is intended as an aid in the diagnosis of influenza from Nasopharyngeal swab specimens and should not be used as a sole basis for treatment. Nasal washings and aspirates are unacceptable for Xpert Xpress SARS-CoV-2/FLU/RSV testing.  Fact Sheet for Patients: BloggerCourse.com  Fact Sheet for Healthcare Providers: SeriousBroker.it  This test is not yet approved or cleared by the [place] FDA and has been authorized for detection and/or diagnosis of SARS-CoV-2 by FDA under an Emergency Use Authorization (EUA). This EUA will remain in effect (meaning this test can be used) for the duration of the COVID-19 declaration under Section 564(b)(1) of the Act, 21 U.S.C. section 360bbb-3(b)(1), unless the authorization is terminated or revoked.     Resp Syncytial Virus by PCR NEGATIVE NEGATIVE Final    Comment: (NOTE) Fact Sheet for Patients: BloggerCourse.com  Fact Sheet for Healthcare Providers: SeriousBroker.it  This test is not yet approved or cleared by the [place] FDA and has been authorized for detection and/or diagnosis of SARS-CoV-2 by FDA under an Emergency Use Authorization (EUA). This EUA will remain in effect (meaning this test can be used) for  the duration of the COVID-19 declaration under Section 564(b)(1) of the Act, 21 U.S.C. section 360bbb-3(b)(1), unless the authorization is terminated or revoked.  Performed at [facility], [address] [place], [place] [address]     Radiology Studies: CT CHEST WO CONTRAST  Result Date: 10/28/2022 CLINICAL DATA:  Pneumonia.  Shortness of breath. EXAM: CT CHEST WITHOUT CONTRAST TECHNIQUE: Multidetector CT imaging of the chest was performed following the standard protocol without IV contrast. RADIATION DOSE REDUCTION: This exam was performed according to the departmental dose-optimization program which includes automated exposure control, adjustment of the  mA and/or kV according to patient size and/or use of iterative reconstruction technique. COMPARISON:  10/02/2022, 09/29/2022 FINDINGS: Cardiovascular: Heart is normal size. Calcified plaque over the left main and coronary arteries. Mild calcification over the aortic root. Thoracic aorta is normal in caliber. There is calcified plaque over the descending thoracic aorta. Remaining vascular structures are unremarkable on this noncontrast exam. Mediastinum/Nodes: Stable AP window lymph node measuring 2.3 cm by short axis. Stable 1 cm precarinal lymph node. No definite hilar adenopathy on this noncontrast exam. Suggestion of small sliding hiatal hernia. Remaining mediastinal structures are unremarkable. Lungs/Pleura: Lungs are adequately inflated with mild emphysematous disease. Persistent but improving opacification over the right lower lobe with mild volume loss of the right lower lobe as findings may be due to improving atelectasis or infection. Resolved small right effusion. Mild bronchiectatic changes in the right base. Significant improvement with moderate clearing of the previously seen material within the left mainstem bronchus and lower lobe bronchi. Improved left base opacification with mild left lower lobe bronchiectatic  change. Few nodular densities within the left mid to lower lung without significant change. There is a 8.2 mm nodule over the left lower lobe (image 77), 6.5 mm nodule (image 86) and 1.1 cm nodule (image 64). Upper Abdomen: Calcified plaque over the abdominal aorta. No acute findings. Stable calcification over the left lobe of the liver. Mild prominence of the right intrarenal collecting system which is not fully evaluated, although was normal on renal ultrasound earlier today. Musculoskeletal: Degenerative changes of the spine. No focal abnormality. IMPRESSION: 1. Persistent but improving opacification over the right lower lobe with mild volume loss as findings may be due to improving atelectasis or infection. Resolved small right effusion. Significant improvement with moderate clearing of the previously seen material within the left mainstem bronchus and lower lobe bronchi. Improved left base opacification. Recommend follow-up to resolution. 2. Few nodular densities within the left mid to lower lung without significant change. Stable mediastinal adenopathy as described which may be reactive. Findings may be due to an infectious, atypical infectious or inflammatory process and may be related to patient's known aspiration. Recommend additional follow-up CT in 6 weeks. 3. Mild emphysematous disease with mild bibasilar bronchiectasis. 4. Aortic atherosclerosis. Atherosclerotic coronary artery disease. Aortic Atherosclerosis (ICD10-I70.0) and Emphysema (ICD10-J43.9). Electronically Signed   By: Elberta Fortis M.D.   On: 10/28/2022 16:47   US RENAL  Result Date: 10/28/2022 CLINICAL DATA:  [id number] Renal failure [id number] EXAM: RENAL / URINARY TRACT ULTRASOUND COMPLETE COMPARISON:  None Available. FINDINGS: The right kidney measured 9.7 cm and the left kidney measured 8.1 cm. The kidneys demonstrate increased echogenicity consistent with chronic medical renal disease. No renal parenchymal lesions are identified. No  shadowing stones are seen. The urinary bladder appeared unremarkable. Bladder prevoid volume was 460 mL. Postvoid images not obtained. IMPRESSION: Echogenic kidneys consistent with chronic medical renal disease. Otherwise unremarkable examination of the kidneys and urinary bladder. Electronically Signed   By: Layla Maw M.D.   On: 10/28/2022 10:33   DG Chest 2 View  Result Date: 10/28/2022 CLINICAL DATA:  88 year old female with history of dyspnea. EXAM: CHEST - 2 VIEW COMPARISON:  Chest x-ray 10/02/2022. FINDINGS: Mild elevation of the left hemidiaphragm. Ill-defined bibasilar opacities which may reflect areas of atelectasis and/or consolidation. Patchy areas of interstitial prominence and peribronchial cuffing in the lungs bilaterally, along with some ill-defined opacities most evident in the left upper lobe, concerning for potential bronchitis and developing multilobar bronchopneumonia. No pneumothorax. No evidence of  pulmonary edema. Heart size is normal. Upper mediastinal contours are within normal limits. Atherosclerosis in the thoracic aorta. IMPRESSION: 1. The appearance of the chest suggests bronchitis, with potential developing multilobar bronchopneumonia most evident left upper lobe. 2. Bibasilar opacities are also noted, which may reflect additional areas of atelectasis and/or consolidation. 3. Aortic atherosclerosis. Electronically Signed   By: Vinnie Langton M.D.   On: 10/28/2022 06:53    Scheduled Meds:  apixaban  5 mg Oral BID   fluticasone furoate-vilanterol  1 puff Inhalation Daily   levothyroxine  75 mcg Oral Daily   metoprolol succinate  50 mg Oral QHS   montelukast  10 mg Oral Daily   pantoprazole  40 mg Oral BID   umeclidinium bromide  1 puff Inhalation Daily   verapamil  180 mg Oral QHS   Continuous Infusions:  sodium chloride 75 mL/hr at 10/29/22 0442   ceFEPime (MAXIPIME) IV      LOS: 0 days   Raiford Noble, DO [facility] Available via Epic secure chat  7am-7pm After these hours, please refer to coverage provider listed on [email or web address] 10/29/2022, 8:58 AM

## 2022-10-29 NOTE — Evaluation (Signed)
Occupational Therapy Evaluation Patient Details Name: Nicole [name] MRN: [id number] DOB: September 14, 1935 Today's Date: 10/29/2022   History of Present Illness Nicole [name] is a 88 y.o. female with medical history significant of chronic diastolic HF, hypothyroidism, hypertension, hyperlipidemia recently discharged on 10/08/2022 after presenting with acute respiratory failure secondary to pneumonia with underlying history of bronchiectasis , was also found to have acute pulmonary embolism, AKI. Presented to ED with sudden [facility]. Patient admitted for possible pneumona and found to have AKI and hyponatremia   Clinical Impression   Mrs. Nicole [name] is an 88 year old woman who presents with above medical history. On evaluation she had no complaints of shortness of breath. She demonstrated ability to transfer out of bed, ambulation to bathroom and in room with walker, don pants, and stand at the sink for ADLs. She had no overt loss of balance. She did not require physical assistance. Patient very loquacious and because of the near constant stream of talking - which is a little tangential- hard to ascertain if patient has any apparent cognitive deficits. She was alert to month and current situation. She was able to state the year eventually. She reports when she is anxious she can forget things. From a functional standpoint she is performing well and has no OT needs.        Recommendations for follow up therapy are one component of a multi-disciplinary discharge planning process, led by the attending physician.  Recommendations may be updated based on patient status, additional functional criteria and insurance authorization.   Follow Up Recommendations  No OT follow up     Assistance Recommended at Discharge Intermittent Supervision/Assistance  Patient can return home with the following Assistance with cooking/housework;Direct supervision/assist for financial management;Direct supervision/assist for  medications management    Functional Status Assessment  Patient has had a recent decline in their functional status and demonstrates the ability to make significant improvements in function in a reasonable and predictable amount of time.  Equipment Recommendations  None recommended by OT    Recommendations for Other Services       Precautions / Restrictions Precautions Precautions: Fall Restrictions Weight Bearing Restrictions: No      Mobility Bed Mobility Overal bed mobility: Modified Independent             General bed mobility comments: increased time to transfer to edge of bed    Transfers Overall transfer level: Needs assistance Equipment used: Rolling walker (2 wheels) Transfers: Sit to/from Stand Sit to Stand: Supervision           General transfer comment: Supervision to ambulate to bathroom and in room with walker. Needed verbal cues to stay on task and to take walker with her but no physical assistance      Balance Overall balance assessment: Mild deficits observed, not formally tested                                         ADL either performed or assessed with clinical judgement   ADL Overall ADL's : Needs assistance/impaired Eating/Feeding: Independent   Grooming: Standing;Oral care;Wash/dry face Grooming Details (indicate cue type and reason): stood at sink for grooming Upper Body Bathing: Supervision/ safety   Lower Body Bathing: Supervison/ safety   Upper Body Dressing : Supervision/safety   Lower Body Dressing: Supervision/safety   Toilet Transfer: Supervision/safety;Rolling walker (2 wheels)   Toileting- Clothing Manipulation  and Hygiene: Supervision/safety       Functional mobility during ADLs: Supervision/safety;Rolling walker (2 wheels)       Vision Patient Visual Report: No change from baseline Additional Comments: R eye with poor vision and impaired motor conrol (eye was injured when she was 88 years  old)     Perception     Praxis      Pertinent Vitals/Pain Pain Assessment Pain Assessment: No/denies pain     Hand Dominance Right   Extremity/Trunk Assessment Upper Extremity Assessment Upper Extremity Assessment: Overall WFL for tasks assessed   Lower Extremity Assessment Lower Extremity Assessment: Defer to PT evaluation   Cervical / Trunk Assessment Cervical / Trunk Assessment: Normal   Communication Communication Communication: No difficulties   Cognition Arousal/Alertness: Awake/alert Behavior During Therapy: WFL for tasks assessed/performed Overall Cognitive Status: Within Functional Limits for tasks assessed                                 General Comments: Is alert to Month. Initially not to year but eventually came up with the year out of the blue. Able to answer questions appropriately. May have some forgetfulness. Very loquacious.     General Comments       Exercises     Shoulder Instructions      Home Living Family/patient expects to be discharged to:: Assisted living                             Home Equipment: Rolling Walker (2 wheels)   Additional Comments: Resident at [employer], lives alone in apartment; meals provided by [employer]. Daughter visits near daily      Prior Functioning/Environment Prior Level of Function : Independent/Modified Independent             Mobility Comments: mod indep ambulating with RW. recently finished working with HHPT services at [facility] ADLs Comments: reports mod indep standing to shower. meals provided by facility        OT Problem List: Decreased strength;Decreased range of motion;Decreased activity tolerance;Impaired balance (sitting and/or standing);Decreased safety awareness;Impaired UE functional use      OT Treatment/Interventions:      OT Goals(Current goals can be found in the care plan section) Acute Rehab OT Goals OT Goal Formulation: All assessment and  education complete, DC therapy  OT Frequency:      Co-evaluation              AM-PAC OT "6 Clicks" Daily Activity     Outcome Measure Help from another person eating meals?: None Help from another person taking care of personal grooming?: None Help from another person toileting, which includes using toliet, bedpan, or urinal?: None Help from another person bathing (including washing, rinsing, drying)?: None Help from another person to put on and taking off regular upper body clothing?: None Help from another person to put on and taking off regular lower body clothing?: None 6 Click Score: 24   End of Session Equipment Utilized During Treatment: Rolling walker (2 wheels) Nurse Communication: Mobility status  Activity Tolerance: Patient tolerated treatment well Patient left: in chair;with call bell/phone within reach;with chair alarm set  OT Visit Diagnosis: Muscle weakness (generalized) (M62.81)                Time: 0935-1001 OT Time Calculation (min): 26 min Charges:  OT General Charges $OT Visit:  1 Visit OT Evaluation $OT Eval Low Complexity: 1 Low OT Treatments $Self Care/Home Management : 8-22 mins  Donnella Sham, OTR/L Acute Care Rehab Services  Office [phone]   Kelli Churn 10/29/2022, 11:03 AM

## 2022-10-29 NOTE — TOC Initial Note (Signed)
Transition of Care [facility]) - Initial/Assessment Note    Patient Details  Name: Nicole [name] MRN: [id number] Date of Birth: July 16, 1935  Transition of Care [facility]) CM/SW Contact:    Illene Regulus, LCSW Phone Number: 10/29/2022, 2:10 PM  Clinical Narrative:                 Pt is from [place] [facility], and can return upon d/c . TOC will continue to follow for d/c needs.   Expected Discharge Plan: Home/Self Care Barriers to Discharge: Continued Medical Work up   Patient Goals and CMS Choice Patient states their goals for this hospitalization and ongoing recovery are:: pt's daughter confims she will be returning to independent living          Expected Discharge Plan and Services       Living arrangements for the past 2 months: [place]                                      Prior Living Arrangements/Services Living arrangements for the past 2 months: [place] Lives with:: Self Patient language and need for interpreter reviewed:: Yes Do you feel safe going back to the place where you live?: Yes      Need for Family Participation in Patient Care: No (Comment) Care giver support system in place?: No (comment)   Criminal Activity/Legal Involvement Pertinent to Current Situation/Hospitalization: No - Comment as needed  Activities of Daily Living Home Assistive Devices/Equipment: Walker (specify type) (with wheels at home per patient) ADL Screening (condition at time of admission) Patient's cognitive ability adequate to safely complete daily activities?: Yes (questionable d/t forgetfulness) Is the patient deaf or have difficulty hearing?: No Does the patient have difficulty seeing, even when wearing glasses/contacts?: No Does the patient have difficulty concentrating, remembering, or making decisions?: Yes (forgetful at times) Patient able to express need for assistance with ADLs?: Yes Does the patient have  difficulty dressing or bathing?: No (needs minimal assist) Independently performs ADLs?: Yes (appropriate for developmental age) Does the patient have difficulty walking or climbing stairs?: Yes Weakness of Legs: Both Weakness of Arms/Hands: None  Permission Sought/Granted   Permission granted to share information with : Yes, Verbal Permission Granted  Share Information with NAME: kim jones     Permission granted to share info w Relationship: daughter  Permission granted to share info w Contact Information: [name]  Emotional Assessment Appearance:: Appears stated age Attitude/Demeanor/Rapport: Gracious Affect (typically observed): Accepting Orientation: : Oriented to Self, Oriented to Place, Oriented to  Time, Oriented to Situation Alcohol / Substance Use: Not Applicable Psych Involvement: No (comment)  Admission diagnosis:  AKI (acute kidney injury) ([place]) [N17.9] HCAP (healthcare-associated pneumonia) [J18.9] Pneumonia due to infectious organism, unspecified laterality, unspecified part of lung [J18.9] Patient Active Problem List   Diagnosis Date Noted   HCAP (healthcare-associated pneumonia) 10/28/2022   Malnutrition of moderate degree 10/01/2022   Hyponatremia 09/30/2022   Septic shock ([place]) 09/29/2022   Bradycardia 09/29/2022   Acute respiratory failure with hypoxia ([place]) 09/29/2022   Hematemesis 09/29/2022   AKI (acute kidney injury) (Home) 09/29/2022   Hyperkalemia 09/29/2022   Pneumonia due to infectious organism 09/29/2022   Acute respiratory failure ([place]) 09/29/2022   Advance care planning 09/29/2022   PCP:  Sherrie Sport, MD Pharmacy:   [place], [place] - [address]  [place] [address] Phone: [phone] Fax: [phone]     Social Determinants of Health (SDOH) Social History: SDOH Screenings   Food Insecurity: No Food Insecurity (10/28/2022)  Housing: Low Risk  (10/28/2022)  Transportation Needs: No  Transportation Needs (10/28/2022)  Utilities: Not At Risk (10/28/2022)   SDOH Interventions:     Readmission Risk Interventions     No data to display

## 2022-10-30 ENCOUNTER — Encounter (HOSPITAL_COMMUNITY): Payer: Self-pay | Admitting: Internal Medicine

## 2022-10-30 DIAGNOSIS — N179 Acute kidney failure, unspecified: Secondary | ICD-10-CM | POA: Diagnosis not present

## 2022-10-30 DIAGNOSIS — E871 Hypo-osmolality and hyponatremia: Secondary | ICD-10-CM | POA: Diagnosis not present

## 2022-10-30 DIAGNOSIS — D649 Anemia, unspecified: Secondary | ICD-10-CM | POA: Diagnosis not present

## 2022-10-30 LAB — COMPREHENSIVE METABOLIC PANEL
ALT: 12 U/L (ref 0–44)
AST: 17 U/L (ref 15–41)
Albumin: 3.2 g/dL — ABNORMAL LOW (ref 3.5–5.0)
Alkaline Phosphatase: 65 U/L (ref 38–126)
Anion gap: 7 (ref 5–15)
BUN: 18 mg/dL (ref 8–23)
CO2: 23 mmol/L (ref 22–32)
Calcium: 9.1 mg/dL (ref 8.9–10.3)
Chloride: 109 mmol/L (ref 98–111)
Creatinine, Ser: 1.07 mg/dL — ABNORMAL HIGH (ref 0.44–1.00)
GFR, Estimated: 51 mL/min — ABNORMAL LOW (ref >60)
Glucose, Bld: 99 mg/dL (ref 70–99)
Potassium: 4.1 mmol/L (ref 3.5–5.1)
Sodium: 139 mmol/L (ref 135–145)
Total Bilirubin: 0.3 mg/dL (ref 0.3–1.2)
Total Protein: 5.8 g/dL — ABNORMAL LOW (ref 6.5–8.1)

## 2022-10-30 LAB — IRON AND TIBC
Iron: 46 ug/dL (ref 28–170)
Saturation Ratios: 12 % (ref 10.4–31.8)
TIBC: 386 ug/dL (ref 250–450)
UIBC: 340 ug/dL

## 2022-10-30 LAB — BASIC METABOLIC PANEL
Anion gap: 6 (ref 5–15)
BUN: 16 mg/dL (ref 8–23)
CO2: 24 mmol/L (ref 22–32)
Calcium: 8.9 mg/dL (ref 8.9–10.3)
Chloride: 105 mmol/L (ref 98–111)
Creatinine, Ser: 0.93 mg/dL (ref 0.44–1.00)
GFR, Estimated: >60 mL/min (ref >60)
Glucose, Bld: 144 mg/dL — ABNORMAL HIGH (ref 70–99)
Potassium: 4.1 mmol/L (ref 3.5–5.1)
Sodium: 135 mmol/L (ref 135–145)

## 2022-10-30 LAB — CBC WITH DIFFERENTIAL/PLATELET
Abs Immature Granulocytes: 0.12 10*3/uL — ABNORMAL HIGH (ref 0.00–0.07)
Basophils Absolute: 0.1 10*3/uL (ref 0.0–0.1)
Basophils Relative: 1 %
Eosinophils Absolute: 0.3 10*3/uL (ref 0.0–0.5)
Eosinophils Relative: 3 %
HCT: 29.2 % — ABNORMAL LOW (ref 36.0–46.0)
Hemoglobin: 9.3 g/dL — ABNORMAL LOW (ref 12.0–15.0)
Immature Granulocytes: 1 %
Lymphocytes Relative: 32 %
Lymphs Abs: 2.8 10*3/uL (ref 0.7–4.0)
MCH: 28.5 pg (ref 26.0–34.0)
MCHC: 31.8 g/dL (ref 30.0–36.0)
MCV: 89.6 fL (ref 80.0–100.0)
Monocytes Absolute: 0.8 10*3/uL (ref 0.1–1.0)
Monocytes Relative: 9 %
Neutro Abs: 4.8 10*3/uL (ref 1.7–7.7)
Neutrophils Relative %: 54 %
Platelets: 297 10*3/uL (ref 150–400)
RBC: 3.26 MIL/uL — ABNORMAL LOW (ref 3.87–5.11)
RDW: 15.2 % (ref 11.5–15.5)
WBC: 8.9 10*3/uL (ref 4.0–10.5)
nRBC: 0 % (ref 0.0–0.2)

## 2022-10-30 LAB — MAGNESIUM: Magnesium: 1.8 mg/dL (ref 1.7–2.4)

## 2022-10-30 LAB — CBC
HCT: 27.3 % — ABNORMAL LOW (ref 36.0–46.0)
Hemoglobin: 8.7 g/dL — ABNORMAL LOW (ref 12.0–15.0)
MCH: 28.2 pg (ref 26.0–34.0)
MCHC: 31.9 g/dL (ref 30.0–36.0)
MCV: 88.6 fL (ref 80.0–100.0)
Platelets: 272 10*3/uL (ref 150–400)
RBC: 3.08 MIL/uL — ABNORMAL LOW (ref 3.87–5.11)
RDW: 15.3 % (ref 11.5–15.5)
WBC: 7.9 10*3/uL (ref 4.0–10.5)
nRBC: 0 % (ref 0.0–0.2)

## 2022-10-30 LAB — PHOSPHORUS: Phosphorus: 3.2 mg/dL (ref 2.5–4.6)

## 2022-10-30 LAB — RETICULOCYTES
Immature Retic Fract: 4.4 % (ref 2.3–15.9)
RBC.: 3.24 MIL/uL — ABNORMAL LOW (ref 3.87–5.11)
Retic Count, Absolute: 25.6 10*3/uL (ref 19.0–186.0)
Retic Ct Pct: 0.8 % (ref 0.4–3.1)

## 2022-10-30 LAB — VITAMIN B12: Vitamin B-12: 563 pg/mL (ref 180–914)

## 2022-10-30 LAB — FOLATE: Folate: 5.1 ng/mL — ABNORMAL LOW (ref >5.9)

## 2022-10-30 LAB — FERRITIN: Ferritin: 11 ng/mL (ref 11–307)

## 2022-10-30 MED ORDER — ADULT MULTIVITAMIN W/MINERALS CH: 1.0000 | ORAL_TABLET | Freq: Every day | ORAL | 0 refills | Status: DC

## 2022-10-30 MED ORDER — FOLIC ACID 1 MG PO TABS
1.0000 mg | ORAL_TABLET | Freq: Every day | ORAL | Status: DC
  Administered 2022-10-30 – 2022-10-31 (×2): 1 mg via ORAL
  Filled 2022-10-30 (×2): qty 1

## 2022-10-30 MED ORDER — ACETAMINOPHEN 325 MG PO TABS: 650.0000 mg | ORAL_TABLET | Freq: Four times a day (QID) | ORAL | Status: DC | PRN

## 2022-10-30 MED ORDER — ENOXAPARIN SODIUM 30 MG/0.3ML IJ SOSY: 30.0000 mg | PREFILLED_SYRINGE | INTRAMUSCULAR | Status: DC

## 2022-10-30 MED ORDER — ADULT MULTIVITAMIN W/MINERALS CH
1.0000 | ORAL_TABLET | Freq: Every day | ORAL | Status: DC
  Administered 2022-10-30 – 2022-10-31 (×2): 1 via ORAL
  Filled 2022-10-30 (×2): qty 1

## 2022-10-30 MED ORDER — SENNOSIDES-DOCUSATE SODIUM 8.6-50 MG PO TABS: 1.0000 | ORAL_TABLET | Freq: Every day | ORAL | 0 refills | Status: DC

## 2022-10-30 MED ORDER — ACETAMINOPHEN 650 MG RE SUPP: 650.0000 mg | Freq: Four times a day (QID) | RECTAL | Status: DC | PRN

## 2022-10-30 MED ORDER — ONDANSETRON HCL 4 MG PO TABS: 4.0000 mg | ORAL_TABLET | Freq: Four times a day (QID) | ORAL | 0 refills | Status: DC | PRN

## 2022-10-30 MED ORDER — ONDANSETRON HCL 4 MG PO TABS: 4.0000 mg | ORAL_TABLET | Freq: Four times a day (QID) | ORAL | Status: DC | PRN

## 2022-10-30 MED ORDER — MAGNESIUM SULFATE 2 GM/50ML IV SOLN
2.0000 g | Freq: Once | INTRAVENOUS | Status: AC
  Administered 2022-10-30: 2 g via INTRAVENOUS
  Filled 2022-10-30: qty 50

## 2022-10-30 MED ORDER — HYDROCODONE-ACETAMINOPHEN 5-325 MG PO TABS: 1.0000 | ORAL_TABLET | ORAL | Status: DC | PRN

## 2022-10-30 MED ORDER — FOLIC ACID 1 MG PO TABS: 1.0000 mg | ORAL_TABLET | Freq: Every day | ORAL | 0 refills | Status: DC

## 2022-10-30 MED ORDER — POLYETHYLENE GLYCOL 3350 17 G PO PACK: 17.0000 g | PACK | Freq: Every day | ORAL | 0 refills | Status: DC

## 2022-10-30 MED ORDER — ONDANSETRON HCL 4 MG/2ML IJ SOLN: 4.0000 mg | Freq: Four times a day (QID) | INTRAMUSCULAR | Status: DC | PRN

## 2022-10-30 MED ORDER — LISINOPRIL 5 MG PO TABS: 5.0000 mg | ORAL_TABLET | Freq: Every day | ORAL | 0 refills | Status: DC

## 2022-10-30 NOTE — Progress Notes (Signed)
Initial Nutrition Assessment  DOCUMENTATION CODES:   Underweight  INTERVENTION:  - Heart healthy diet.  - Ensure Plus High Protein po BID, each supplement provides 350 kcal and 20 grams of protein. - Consider daily multivitamin. - Monitor weight trends.   NUTRITION DIAGNOSIS:   Increased nutrient needs related to acute illness as evidenced by estimated needs.  GOAL:   Patient will meet greater than or equal to 90% of their needs  MONITOR:   PO intake, Supplement acceptance, Weight trends  REASON FOR ASSESSMENT:   Malnutrition Screening Tool    ASSESSMENT:   88 year old female with PMH significant for chronic diastolic CHF, hypothyroidism, HTN, HLD as well as other comorbidities who was recently discharged on 10/09/2019 after presenting with acute respiratory failure with secondary to pneumonia with underlying history of bronchiectasis and was found to have an acute pulmonary embolism as well as AKI at that time. Presented this admission for SOB.  Patient reports she has recently lost some weight due to feeling sick. States weight loss started around 5 weeks ago when she started to feel sick (prior to previous admission). Patient was weighed at 111# during previous admission on 12/11 and weighed this admission at both 104# and then 98#. This could represent weight loss but unable to fully assess at this time.  Patient reports she was eating well until this admission. Usually has a large breakfast and a small lunch and dinner. Appetite is usually good and patient reports her current appetite is now improved after no longer being constipated. Documented to be consuming 50-100% of meals since admission. She is now ready to get back to home now that feeling better.   Medications reviewed and include: Synthroid, Miralax, Senokot  Labs reviewed:  Creatinine 1.07 HA1C 6.3   NUTRITION - FOCUSED PHYSICAL EXAM:  Deferred  Diet Order:   Diet Order             Diet Heart Room  service appropriate? Yes; Fluid consistency: Thin  Diet effective now           Diet - low sodium heart healthy                   EDUCATION NEEDS:  Education needs have been addressed  Skin:  Skin Assessment: Reviewed RN Assessment  Last BM:  1/9  Height:  Ht Readings from Last 1 Encounters:  10/28/22 5\' 3"  (1.6 m)   Weight:  Wt Readings from Last 1 Encounters:  10/28/22 44.3 kg    BMI:  Body mass index is 17.29 kg/m.  Estimated Nutritional Needs:  Kcal:  1550-1750 kcals Protein:  65-80 grams Fluid:  >/= 1.5L    Samson Frederic RD, LDN For contact information, refer to [facility].

## 2022-10-30 NOTE — Discharge Summary (Signed)
Physician Discharge Summary   Patient: Nicole [name] MRN: [id number] DOB: [birth year]  Admit date:     10/28/2022  Discharge date: 10/30/22  Discharge Physician: Raiford Noble, DO   PCP: Sherrie Sport, MD   Recommendations at discharge:   Follow-up with PCP within 1 to 2 weeks and repeat CBC, CMP, mag, Phos within 1 week Follow-up with pulmonary in outpatient setting within 1 to 2 weeks for post hospitalization bronchiectasis evaluation Follow-up with neurology outpatient setting for further dementia testing  Discharge Diagnoses: Principal Problem:   HCAP (healthcare-associated pneumonia) Active Problems:   AKI (acute kidney injury) ([place])   Hyponatremia  Resolved Problems:   * No resolved hospital problems. [facility] Course: The patient is an 88 year old underweight chronically ill-appearing Caucasian female with past medical history significant for but not limited to chronic diastolic CHF, hypothyroidism, hypertension, hyperlipidemia as well as other comorbidities who was recently discharged on 10/09/2019 after presenting with acute respiratory failure with secondary to pneumonia with underlying history of bronchiectasis and was found to have an acute pulmonary embolism as well as AKI at that time.  During that admission the patient was intubated and was on pressors.  She eventually stabilized and was sent back to ILF.  Yesterday she presented to the ED with concerns of shortness of breath for day and reports that she went to go get something in the kitchen and had an intense feeling of shortness of breath and felt like she was having a panic attack.  Patient was assessed by the staff at her ILF and they recommended her come to the ER to be evaluated.  In the ED patient reported symptoms completely resolved and denied any further shortness of breath, chest discomfort fevers or chills.  She does report some chronic cough with whitish sputum but of note there was some strong suspicion of mild  cognitive impairment.  In the ED she was noted to have an AKI and hyponatremia and was admitted for evaluation and management.   **Interim History She is getting IVF Hydration and Renal Fxn is improving. She will need an ambulatory home O2 screen prior to D/C and she did not desaturate on amatory home O2 screen.  She is much improved and renal function is back to her baseline.  She is medically stable for discharge at this time will need to follow-up with PCP as well as pulmonary in outpatient setting.  We recommend outpatient neurology evaluation for mild cognitive impairment and dementia evaluation..  Assessment and Plan:  ??HCAP, ruled out  History of underlying bronchiectasis -Afebrile, no leukocytosis -Chest x-ray showed potential developing multilobar bronchopneumonia most evident in the left upper lobe, basilar opacities -CT chest without contrast done and showed "Persistent but improving opacification over the right lower lobe with mild volume loss as findings may be due to improving atelectasis or infection. Resolved small right effusion. Significant improvement with moderate clearing of the previously seen material within the left mainstem bronchus and lower lobe bronchi. Improved left base opacification. Recommend follow-up to resolution. Few nodular densities within the left mid to lower lung without significant change. Stable mediastinal adenopathy as described which may be reactive. Findings may be due to an infectious, atypical infectious or inflammatory process and may be related to patient's known aspiration. Recommend additional follow-up CT in 6 weeks. Mild emphysematous disease with mild bibasilar bronchiectasis. Aortic atherosclerosis. Atherosclerotic coronary artery disease. Aortic Atherosclerosis and Emphysema " -BC X 2 showed NGTD at 2 Days -Urinalysis unremarkable, UC showing No Growth -  Was initiated on IV Cefepime but since afebrile and has no Leukocytosis or symptoms will  observe off of Antibiotics; Given Zyvox yesterday -Continue nebulizers with DuoNeb q2hprn Wheezing and SOB, Inhalers with Breo-Ellipta 1 puff IH Daily and Incruse Ellipta 1 puff IH Daily,  -Initiated Montelukast 10 mg po Daily  -Continue to Monitor closely in the progressive floor given recent history of PE -Repeat CXR in the outpatient setting within 3-6 weeks and follow up Pulmonary int eh outpateint    AKI -Baseline Cr normal, on admission creatinine 2.56 -Renal ultrasound with no obstructive uropathy -Likely 2/2 lisinopril/HCTZ, will hold, unsure if patient was on Lasix prior to arrival; Will D/C Lisinopirl-HCTZ at D/C and just start Lisinopril 5 mg po Daily  -Gentle IVF hydration with NS at 75 mL/hr to stop today  -BUN/Cr Trend: Recent Labs  Lab 10/06/22 0600 10/08/22 0151 10/28/22 0534 10/28/22 1325 10/29/22 0411 10/30/22 0412 10/30/22 1012  BUN 21 19 34* 31* 26* 18 16  CREATININE 0.85 0.80 2.56* 2.07* 1.52* 1.07* 0.93  -Strict I's and O's and Daily Weights   -Avoid Nephrotoxic Medications, Contrast Dyes, Hypotension and Dehydration to Ensure Adequate Renal Perfusion and will need to Renally Adjust Meds -Continue to Monitor and Trend Renal Function carefully and repeat CMP in the AM    Hyponatremia -IVF to stop today  -Na+ Trend Recent Labs  Lab 10/06/22 0600 10/08/22 0151 10/28/22 0534 10/28/22 1325 10/29/22 0411 10/30/22 0412 10/30/22 1012  NA 137 141 127* 130* 136 139 135  -Continue to Monitor and Trend and repeat CMP in the AM    History of PE -Diagnosed on 09/2022 -Continue Anticoagulation with Apixaban 5 mg po BID    History of Chronic Diastolic HF -Appears euvolemic today  -BNP 371, on last admission was 755 -Chest x-ray done and showed "The appearance of the chest suggests bronchitis, with potential developing multilobar bronchopneumonia most evident left upper lobe. Bibasilar opacities are also noted, which may reflect additional areas of atelectasis  and/or consolidation. Aortic atherosclerosis." -Echo done on 09/2022 showed EF of 55 to 60% with grade 3 diastolic dysfunction -Strict I's and O's and Daily Weights; She is +3.069 Liters -IVF to stop today    Hypertension -BP stable -Continue Metoprolol Succinate 50 mg po qHS, Verapamil 180 mg po qHS, and will continue to hold lisinopril/HCTZ given renal Dysfunction -Continue to Monitor BP per Protocol -Last BP reading was 117/69   Hypothyroidism -TSH 10.4, free T4-->1.53 -Unsure if patient was taking her medication and not -Will continue Levothyroxine 75 mcg po Daily  -Will need Repeat TFTs in 4-6 weeks    Normocytic Anemia -Hgb/Hct Trend: Recent Labs  Lab 10/07/22 0214 10/07/22 1829 10/08/22 0151 10/28/22 0534 10/29/22 0411 10/30/22 0412 10/30/22 1012  HGB 7.8* 8.8* 8.3* 9.4* 8.8* 9.3* 8.7*  HCT 23.7* 27.3* 25.6* 28.4* 26.4* 29.2* 27.3*  MCV 86.5  --  88.0 86.1 86.6 89.6 88.6  -Checked Anemia Panel and showed an iron level of 46, UIBC of 340, TIBC 386, saturation ratios of 12%, ferritin level of 11, folate level 5.1, vitamin B12 level 563 -Will start folate supplementation as well as a daily multivitamin -Continue to Monitor for S/Sx of Bleeding; No overt bleeding noted -Repeat CBC in the AM    Underweight -Estimated body mass index is 17.29 kg/m as calculated from the following:   Height as of this encounter: 5\' 3"  (1.6 m).   Weight as of this encounter: 44.3 kg. -Nutritionist consulted for further evaluation recommendations and they  are recommending a heart healthy diet as well as Ensure Plus high-protein p.o. twice daily and daily multivitamin and monitoring weight trends Nutrition Documentation    Flowsheet Row ED to Hosp-Admission (Current) from 10/28/2022 in [place] 4TH FLOOR PROGRESSIVE CARE AND UROLOGY  Nutrition Problem Increased nutrient needs  Etiology acute illness  Nutrition Goal Patient will meet greater than or equal to 90% of their needs   Interventions Ensure Enlive (each supplement provides 350kcal and 20 grams of protein), Refer to RD note for recommendations      Consultants: None Procedures performed: As delineated as above  Disposition: Home health Diet recommendation:  Discharge Diet Orders (From admission, onward)     Start     Ordered   10/30/22 0000  Diet - low sodium heart healthy        10/30/22 1435           Cardiac diet DISCHARGE MEDICATION: Allergies as of 10/30/2022       Reactions   Codeine Nausea Only, Other (See Comments)   Dizziness    Penicillins Other (See Comments)   Unknown        Medication List     STOP taking these medications    lisinopril-hydrochlorothiazide 20-12.5 MG tablet Commonly known as: ZESTORETIC   Spiriva HandiHaler 18 MCG inhalation capsule Generic drug: tiotropium       TAKE these medications    acetaminophen 325 MG tablet Commonly known as: TYLENOL Take 325 mg by mouth daily as needed for moderate pain or headache.   albuterol 108 (90 Base) MCG/ACT inhaler Commonly known as: VENTOLIN HFA Inhale 2 puffs into the lungs every 4 (four) hours as needed for wheezing or shortness of breath.   apixaban 5 MG Tabs tablet Commonly known as: ELIQUIS Take 1 tablet (5 mg total) by mouth 2 (two) times daily. What changed: Another medication with the same name was removed. Continue taking this medication, and follow the directions you see here.   atorvastatin 20 MG tablet Commonly known as: LIPITOR Take 20 mg by mouth daily.   fluticasone-salmeterol 250-50 MCG/ACT Aepb Commonly known as: ADVAIR Inhale 1 puff into the lungs 2 (two) times daily.   Incruse Ellipta 62.5 MCG/ACT Aepb Generic drug: umeclidinium bromide Inhale 1 puff into the lungs daily.   latanoprost 0.005 % ophthalmic solution Commonly known as: XALATAN Place 1 drop into both eyes at bedtime.   levocetirizine 5 MG tablet Commonly known as: XYZAL Take 5 mg by mouth daily as needed for  allergies.   levofloxacin 750 MG tablet Commonly known as: LEVAQUIN Take 750 mg by mouth daily.   levothyroxine 75 MCG tablet Commonly known as: SYNTHROID Take 75 mcg by mouth daily.   lisinopril 5 MG tablet Commonly known as: ZESTRIL Take 1 tablet (5 mg total) by mouth daily.   meclizine 25 MG tablet Commonly known as: ANTIVERT Take 12.5 mg by mouth daily as needed for dizziness.   metoprolol succinate 50 MG 24 hr tablet Commonly known as: TOPROL-XL Take 50 mg by mouth at bedtime.   montelukast 10 MG tablet Commonly known as: SINGULAIR Take 10 mg by mouth daily.   ondansetron 4 MG tablet Commonly known as: ZOFRAN Take 1 tablet (4 mg total) by mouth every 6 (six) hours as needed for nausea.   pantoprazole 40 MG tablet Commonly known as: PROTONIX Take 1 tablet (40 mg total) by mouth 2 (two) times daily.   polyethylene glycol 17 g packet Commonly known as: MIRALAX / GLYCOLAX  Take 17 g by mouth daily.   senna-docusate 8.6-50 MG tablet Commonly known as: Senokot-S Take 1 tablet by mouth at bedtime.   verapamil 180 MG CR tablet Commonly known as: CALAN-SR Take 180 mg by mouth at bedtime.        Discharge Exam: Filed Weights   10/28/22 0736 10/28/22 2132  Weight: 47.2 kg 44.3 kg   Vitals:   10/30/22 0819 10/30/22 1333  BP:  117/69  Pulse:  71  Resp: (!) 22 20  Temp:  98.4 F (36.9 C)  SpO2: 95% 96%   Examination: Physical Exam:  Constitutional: WN/WD thin Caucasian female currently no acute distress appears calm sitting in chair at bedside Respiratory: Diminished to auscultation bilaterally with some coarse breath sounds, no wheezing, rales, rhonchi or crackles. Normal respiratory effort and patient is not tachypenic. No accessory muscle use.  Unlabored breathing and not wearing supplemental oxygen via nasal cannula Cardiovascular: RRR, no murmurs / rubs / gallops. S1 and S2 auscultated. No extremity edema.  Abdomen: Soft, non-tender, non-distended.  Bowel sounds positive.  GU: Deferred. Musculoskeletal: No clubbing / cyanosis of digits/nails. No joint deformity upper and lower extremities.  Skin: No rashes, lesions, ulcers limited skin evaluation. No induration; Warm and dry.  Neurologic: CN 2-12 grossly intact with no focal deficits. Romberg sign and cerebellar reflexes not assessed.  Psychiatric: Normal judgment and insight. Alert and oriented x 3.  Slightly anxious mood and appropriate affect.   Condition at discharge: stable  The results of significant diagnostics from this hospitalization (including imaging, microbiology, ancillary and laboratory) are listed below for reference.   Imaging Studies: CT CHEST WO CONTRAST  Result Date: 10/28/2022 CLINICAL DATA:  Pneumonia.  Shortness of breath. EXAM: CT CHEST WITHOUT CONTRAST TECHNIQUE: Multidetector CT imaging of the chest was performed following the standard protocol without IV contrast. RADIATION DOSE REDUCTION: This exam was performed according to the departmental dose-optimization program which includes automated exposure control, adjustment of the mA and/or kV according to patient size and/or use of iterative reconstruction technique. COMPARISON:  10/02/2022, 09/29/2022 FINDINGS: Cardiovascular: Heart is normal size. Calcified plaque over the left main and coronary arteries. Mild calcification over the aortic root. Thoracic aorta is normal in caliber. There is calcified plaque over the descending thoracic aorta. Remaining vascular structures are unremarkable on this noncontrast exam. Mediastinum/Nodes: Stable AP window lymph node measuring 2.3 cm by short axis. Stable 1 cm precarinal lymph node. No definite hilar adenopathy on this noncontrast exam. Suggestion of small sliding hiatal hernia. Remaining mediastinal structures are unremarkable. Lungs/Pleura: Lungs are adequately inflated with mild emphysematous disease. Persistent but improving opacification over the right lower lobe with mild  volume loss of the right lower lobe as findings may be due to improving atelectasis or infection. Resolved small right effusion. Mild bronchiectatic changes in the right base. Significant improvement with moderate clearing of the previously seen material within the left mainstem bronchus and lower lobe bronchi. Improved left base opacification with mild left lower lobe bronchiectatic change. Few nodular densities within the left mid to lower lung without significant change. There is a 8.2 mm nodule over the left lower lobe (image 77), 6.5 mm nodule (image 86) and 1.1 cm nodule (image 64). Upper Abdomen: Calcified plaque over the abdominal aorta. No acute findings. Stable calcification over the left lobe of the liver. Mild prominence of the right intrarenal collecting system which is not fully evaluated, although was normal on renal ultrasound earlier today. Musculoskeletal: Degenerative changes of the spine. No focal  abnormality. IMPRESSION: 1. Persistent but improving opacification over the right lower lobe with mild volume loss as findings may be due to improving atelectasis or infection. Resolved small right effusion. Significant improvement with moderate clearing of the previously seen material within the left mainstem bronchus and lower lobe bronchi. Improved left base opacification. Recommend follow-up to resolution. 2. Few nodular densities within the left mid to lower lung without significant change. Stable mediastinal adenopathy as described which may be reactive. Findings may be due to an infectious, atypical infectious or inflammatory process and may be related to patient's known aspiration. Recommend additional follow-up CT in 6 weeks. 3. Mild emphysematous disease with mild bibasilar bronchiectasis. 4. Aortic atherosclerosis. Atherosclerotic coronary artery disease. Aortic Atherosclerosis (ICD10-I70.0) and Emphysema (ICD10-J43.9). Electronically Signed   By: Marin Olp M.D.   On: 10/28/2022 16:47    US RENAL  Result Date: 10/28/2022 CLINICAL DATA:  [id number] Renal failure 244010 EXAM: RENAL / URINARY TRACT ULTRASOUND COMPLETE COMPARISON:  None Available. FINDINGS: The right kidney measured 9.7 cm and the left kidney measured 8.1 cm. The kidneys demonstrate increased echogenicity consistent with chronic medical renal disease. No renal parenchymal lesions are identified. No shadowing stones are seen. The urinary bladder appeared unremarkable. Bladder prevoid volume was 460 mL. Postvoid images not obtained. IMPRESSION: Echogenic kidneys consistent with chronic medical renal disease. Otherwise unremarkable examination of the kidneys and urinary bladder. Electronically Signed   By: Sammie Bench M.D.   On: 10/28/2022 10:33   DG Chest 2 View  Result Date: 10/28/2022 CLINICAL DATA:  88 year old female with history of dyspnea. EXAM: CHEST - 2 VIEW COMPARISON:  Chest x-ray 10/02/2022. FINDINGS: Mild elevation of the left hemidiaphragm. Ill-defined bibasilar opacities which may reflect areas of atelectasis and/or consolidation. Patchy areas of interstitial prominence and peribronchial cuffing in the lungs bilaterally, along with some ill-defined opacities most evident in the left upper lobe, concerning for potential bronchitis and developing multilobar bronchopneumonia. No pneumothorax. No evidence of pulmonary edema. Heart size is normal. Upper mediastinal contours are within normal limits. Atherosclerosis in the thoracic aorta. IMPRESSION: 1. The appearance of the chest suggests bronchitis, with potential developing multilobar bronchopneumonia most evident left upper lobe. 2. Bibasilar opacities are also noted, which may reflect additional areas of atelectasis and/or consolidation. 3. Aortic atherosclerosis. Electronically Signed   By: Vinnie Langton M.D.   On: 10/28/2022 06:53   VAS [place] LOWER EXTREMITY VENOUS (DVT)  Result Date: 10/03/2022  Lower Venous DVT Study Patient Name:  ADAORA MCHANEY  Date of  Exam:   10/03/2022 Medical Rec #: [id number]      Accession #:    [id number] Date of Birth: 09-26-1935      Patient Gender: F Patient Age:   72 years Exam Location:  [facility] Procedure:      VAS [place] LOWER EXTREMITY VENOUS (DVT) Referring Phys: DEBBY CROSLEY --------------------------------------------------------------------------------  Indications: Pulmonary embolism.  Comparison Study: no prior Performing Technologist: Archie Patten RVS  Examination Guidelines: A complete evaluation includes B-mode imaging, spectral Doppler, color Doppler, and power Doppler as needed of all accessible portions of each vessel. Bilateral testing is considered an integral part of a complete examination. Limited examinations for reoccurring indications may be performed as noted. The reflux portion of the exam is performed with the patient in reverse Trendelenburg.  +---------+---------------+---------+-----------+----------+--------------+ RIGHT    CompressibilityPhasicitySpontaneityPropertiesThrombus Aging +---------+---------------+---------+-----------+----------+--------------+ CFV      Full           Yes      Yes                                 +---------+---------------+---------+-----------+----------+--------------+  SFJ      Full                                                        +---------+---------------+---------+-----------+----------+--------------+ FV Prox  Full                                                        +---------+---------------+---------+-----------+----------+--------------+ FV Mid   Full                                                        +---------+---------------+---------+-----------+----------+--------------+ FV DistalFull                                                        +---------+---------------+---------+-----------+----------+--------------+ PFV      Full                                                         +---------+---------------+---------+-----------+----------+--------------+ POP      Full           Yes      Yes                                 +---------+---------------+---------+-----------+----------+--------------+ PTV      Full                                                        +---------+---------------+---------+-----------+----------+--------------+ PERO     Full                                                        +---------+---------------+---------+-----------+----------+--------------+   +---------+---------------+---------+-----------+----------+--------------+ LEFT     CompressibilityPhasicitySpontaneityPropertiesThrombus Aging +---------+---------------+---------+-----------+----------+--------------+ CFV      Full           Yes      Yes                                 +---------+---------------+---------+-----------+----------+--------------+ SFJ      Full                                                        +---------+---------------+---------+-----------+----------+--------------+  FV Prox  Full                                                        +---------+---------------+---------+-----------+----------+--------------+ FV Mid   Full                                                        +---------+---------------+---------+-----------+----------+--------------+ FV DistalFull                                                        +---------+---------------+---------+-----------+----------+--------------+ PFV      Full                                                        +---------+---------------+---------+-----------+----------+--------------+ POP      Full           Yes      Yes                                 +---------+---------------+---------+-----------+----------+--------------+ PTV      Full                                                         +---------+---------------+---------+-----------+----------+--------------+ PERO     Full                                                        +---------+---------------+---------+-----------+----------+--------------+     Summary: BILATERAL: - No evidence of deep vein thrombosis seen in the lower extremities, bilaterally. -No evidence of popliteal cyst, bilaterally.   *See table(s) above for measurements and observations. Electronically signed by Jamelle Haring on 10/03/2022 at 6:04:43 PM.    Final    CT Angio Chest Pulmonary Embolism (PE) W or WO Contrast  Result Date: 10/02/2022 CLINICAL DATA:  Short of breath, hypoxia, elevated D-dimer EXAM: CT ANGIOGRAPHY CHEST WITH CONTRAST TECHNIQUE: Multidetector CT imaging of the chest was performed using the standard protocol during bolus administration of intravenous contrast. Multiplanar CT image reconstructions and MIPs were obtained to evaluate the vascular anatomy. RADIATION DOSE REDUCTION: This exam was performed according to the departmental dose-optimization program which includes automated exposure control, adjustment of the mA and/or kV according to patient size and/or use of iterative reconstruction technique. CONTRAST:  15mL OMNIPAQUE IOHEXOL 350 MG/ML SOLN COMPARISON:  10/02/2022, 09/29/2022 FINDINGS: Cardiovascular: This is a technically adequate evaluation of the pulmonary vasculature. There is a filling defect  within the anterior basilar segmental branch of the right lower lobe pulmonary artery, with peripheral lung consolidation consistent with pulmonary embolus and infarct. No other pulmonary emboli are identified. The heart is unremarkable without pericardial effusion. No evidence of thoracic aortic aneurysm or dissection. Atherosclerosis of the aorta and coronary vasculature. Mediastinum/Nodes: Continued adenopathy within the AP window measuring up to 2.4 cm in short axis. Thyroid, trachea, and esophagus are unremarkable. Lungs/Pleura:  Right shaped consolidation and volume loss within the right lower lobe consistent with pulmonary infarct. Trace bilateral pleural effusions, right greater than left. Stable left pleural calcifications. There is opacification of the left mainstem bronchus as well as the upper and lower lobe segmental bronchi, consistent with mucoid impaction, retained secretions, or aspiration. Patchy airspace disease throughout the left lung consistent with inflammatory or infectious etiology, without significant change since prior exam. No pneumothorax. Upper Abdomen: No acute abnormality. Musculoskeletal: Chronic postsurgical or posttraumatic changes of the left thoracic cage, stable. No acute displaced fracture. Reconstructed images demonstrate no additional findings. Review of the MIP images confirms the above findings. IMPRESSION: 1. Findings consistent with right lower lobe segmental pulmonary embolus, and associated pulmonary infarct. Minimal clot burden. No evidence of right heart strain. 2. Persistent nodular airspace disease throughout the left lung, compatible with inflammatory or infectious etiology. As per previous recommendation, short interval follow-up recommended to document resolution after appropriate medical management. 3. Opacification of the left mainstem bronchus and segmental upper and lower lobe bronchi, consistent with retained secretions, mucoid impaction, or aspiration. 4. Stable adenopathy at the AP window. 5. Aortic Atherosclerosis (ICD10-I70.0). Coronary artery atherosclerosis. Critical Value/emergent results were called by telephone at the time of interpretation on 10/02/2022 at 8:34 pm to provider DR CROSLEY, who verbally acknowledged these results. Electronically Signed   By: Randa Ngo M.D.   On: 10/02/2022 20:36   DG CHEST PORT 1 VIEW  Result Date: 10/02/2022 CLINICAL DATA:  Shortness of breath. EXAM: PORTABLE CHEST 1 VIEW COMPARISON:  CT chest and chest x-ray dated September 29, 2022.  FINDINGS: Interval extubation. Unchanged cardiomegaly. Similar left basilar patchy reticulonodular opacities. Improving interstitial opacity at the right lung base. Unchanged small bilateral pleural effusions. No pneumothorax. No acute osseous abnormality. IMPRESSION: 1. Unchanged left basilar and improving right basilar pneumonia. 2. Unchanged small bilateral pleural effusions. Electronically Signed   By: Titus Dubin M.D.   On: 10/02/2022 10:57   DG Hand Complete Left  Result Date: 10/01/2022 CLINICAL DATA:  Left hand pain. EXAM: LEFT HAND - COMPLETE 3+ VIEW COMPARISON:  None Available. FINDINGS: There is diffuse decreased bone mineralization. Mild-to-moderate interphalangeal joint space narrowing and peripheral osteophytosis diffusely throughout the first through fifth digits. Moderate thumb carpometacarpal joint space narrowing, subchondral sclerosis, and peripheral osteophytosis. No acute fracture is seen.  No dislocation. Mild calcification overlying the triangular fibrocartilage complex. IMPRESSION: 1. Moderate thumb carpometacarpal osteoarthritis. 2. Mild-to-moderate interphalangeal osteoarthritis diffusely throughout the first through fifth digits. Electronically Signed   By: Yvonne Kendall M.D.   On: 10/01/2022 14:56   DG Shoulder Right  Result Date: 10/01/2022 CLINICAL DATA:  Right shoulder pain. EXAM: RIGHT SHOULDER - 2+ VIEW COMPARISON:  None Available. FINDINGS: There is diffuse decreased bone mineralization. Moderate to severe inferior glenohumeral joint space narrowing. Moderate inferior humeral head-neck junction degenerative osteophytosis. Mild peripheral glenoid degenerative osteophytosis. Mild-to-moderate superomedial humeral head cortical flattening/remodeling with diffuse subchondral sclerosis and mild subchondral cystic change. Mild acromioclavicular joint space narrowing and peripheral osteophytosis. No acute fracture or dislocation. The visualized portion of the right  lung is  unremarkable. Mild-to-moderate atherosclerotic calcifications within the aortic arch. IMPRESSION: 1. 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 2. Mild acromioclavicular osteoarthritis. Electronically Signed   By: Yvonne Kendall M.D.   On: 10/01/2022 14:55    Microbiology: Results for orders placed or performed during the hospital encounter of 10/28/22  Resp panel by RT-PCR (RSV, Flu A&B, Covid) Anterior Nasal Swab     Status: None   Collection Time: 10/28/22  5:54 AM   Specimen: Anterior Nasal Swab  Result Value Ref Range Status   SARS Coronavirus 2 by RT PCR NEGATIVE NEGATIVE Final    Comment: (NOTE) SARS-CoV-2 target nucleic acids are NOT DETECTED.  The SARS-CoV-2 RNA is generally detectable in upper respiratory specimens during the acute phase of infection. The lowest concentration of SARS-CoV-2 viral copies this assay can detect is 138 copies/mL. A negative result does not preclude SARS-Cov-2 infection and should not be used as the sole basis for treatment or other patient management decisions. A negative result may occur with  improper specimen collection/handling, submission of specimen other than nasopharyngeal swab, presence of viral mutation(s) within the areas targeted by this assay, and inadequate number of viral copies(<138 copies/mL). A negative result must be combined with clinical observations, patient history, and epidemiological information. The expected result is Negative.  Fact Sheet for Patients:  EntrepreneurPulse.com.au  Fact Sheet for Healthcare Providers:  IncredibleEmployment.be  This test is no t yet approved or cleared by the [place] FDA and  has been authorized for detection and/or diagnosis of SARS-CoV-2 by FDA under an Emergency Use Authorization (EUA). This EUA will remain  in effect (meaning this test can be used) for the duration of the COVID-19 declaration under Section 564(b)(1) of the Act,  21 U.S.C.section 360bbb-3(b)(1), unless the authorization is terminated  or revoked sooner.       Influenza A by PCR NEGATIVE NEGATIVE Final   Influenza B by PCR NEGATIVE NEGATIVE Final    Comment: (NOTE) The Xpert Xpress SARS-CoV-2/FLU/RSV plus assay is intended as an aid in the diagnosis of influenza from Nasopharyngeal swab specimens and should not be used as a sole basis for treatment. Nasal washings and aspirates are unacceptable for Xpert Xpress SARS-CoV-2/FLU/RSV testing.  Fact Sheet for Patients: EntrepreneurPulse.com.au  Fact Sheet for Healthcare Providers: IncredibleEmployment.be  This test is not yet approved or cleared by the [place] FDA and has been authorized for detection and/or diagnosis of SARS-CoV-2 by FDA under an Emergency Use Authorization (EUA). This EUA will remain in effect (meaning this test can be used) for the duration of the COVID-19 declaration under Section 564(b)(1) of the Act, 21 U.S.C. section 360bbb-3(b)(1), unless the authorization is terminated or revoked.     Resp Syncytial Virus by PCR NEGATIVE NEGATIVE Final    Comment: (NOTE) Fact Sheet for Patients: EntrepreneurPulse.com.au  Fact Sheet for Healthcare Providers: IncredibleEmployment.be  This test is not yet approved or cleared by the [place] FDA and has been authorized for detection and/or diagnosis of SARS-CoV-2 by FDA under an Emergency Use Authorization (EUA). This EUA will remain in effect (meaning this test can be used) for the duration of the COVID-19 declaration under Section 564(b)(1) of the Act, 21 U.S.C. section 360bbb-3(b)(1), unless the authorization is terminated or revoked.  Performed at [facility], [place] [address], [place], [place] [address]   Culture, blood (routine x 2)     Status: None (Preliminary result)   Collection Time: 10/28/22  7:23 AM   Specimen:  BLOOD  Result Value  Ref Range Status   Specimen Description   Final    BLOOD SITE NOT SPECIFIED Performed at [facility] [place] [address] [place], [place] [address]    Special Requests   Final    BOTTLES DRAWN AEROBIC AND ANAEROBIC Blood Culture adequate volume Performed at [place] [address], [place], [place] [address]    Culture   Final    NO GROWTH 2 DAYS Performed at [facility] [address], [place], [place] [address]    Report Status PENDING  Incomplete  Culture, blood (routine x 2)     Status: None (Preliminary result)   Collection Time: 10/28/22  7:45 AM   Specimen: BLOOD  Result Value Ref Range Status   Specimen Description   Final    BLOOD SITE NOT SPECIFIED Performed at [facility] [address] [place], [place] [address]    Special Requests   Final    BOTTLES DRAWN AEROBIC AND ANAEROBIC Blood Culture adequate volume Performed at [facility] [address] [place], [place] [address]    Culture   Final    NO GROWTH 2 DAYS Performed at [facility] [address], [place], [place] [address]    Report Status PENDING  Incomplete  Urine Culture     Status: None   Collection Time: 10/28/22 12:26 PM   Specimen: In/Out Cath Urine  Result Value Ref Range Status   Specimen Description   Final    IN/OUT CATH URINE Performed at [facility], [place] [address] [place], [place] [address]    Special Requests   Final    NONE Performed at [facility], [place] [address], [place], [place] [address]    Culture   Final    NO GROWTH Performed at [facility], [place] [address] [place], [place] [address]    Report Status 10/29/2022 FINAL  Final   Labs: CBC: Recent Labs  Lab 10/28/22 0534 10/29/22 0411 10/30/22 0412 10/30/22 1012  WBC 8.8 5.6 8.9 7.9  NEUTROABS 6.5  --  4.8  --   HGB 9.4* 8.8* 9.3* 8.7*  HCT 28.4* 26.4*  29.2* 27.3*  MCV 86.1 86.6 89.6 88.6  PLT 314 245 297 [id number]   Basic Metabolic Panel: Recent Labs  Lab 10/28/22 0534 10/28/22 1325 10/29/22 0411 10/30/22 0412 10/30/22 1012  NA 127* 130* 136 139 135  K 4.4 3.8 4.0 4.1 4.1  CL 96* 98 105 109 105  CO2 21* 23 25 23 24   GLUCOSE 105* 98 85 99 144*  BUN 34* 31* 26* 18 16  CREATININE 2.56* 2.07* 1.52* 1.07* 0.93  CALCIUM 9.5 8.8* 9.0 9.1 8.9  MG  --   --   --  1.8  --   PHOS  --   --   --  3.2  --    Liver Function Tests: Recent Labs  Lab 10/28/22 0534 10/30/22 0412  AST 18 17  ALT 14 12  ALKPHOS 75 65  BILITOT 0.5 0.3  PROT 6.7 5.8*  ALBUMIN 3.7 3.2*   CBG: No results for input(s): "GLUCAP" in the last 168 hours.  Discharge time spent: greater than 30 minutes.  Signed: Raiford Noble, DO [facility] 10/30/2022

## 2022-10-30 NOTE — Plan of Care (Signed)

## 2022-10-30 NOTE — TOC Progression Note (Addendum)
Transition of Care [facility]) - Progression Note    Patient Details  Name: Nicole [name] MRN: [id number] Date of Birth: 17-Sep-1935  Transition of Care [facility]) CM/SW [place], LCSW Phone Number: 10/30/2022, 9:50 AM  Clinical Narrative:     Pt is active with [facility] services. Will need new orders for [facility] and PT. MD made aware. TOC to follow  ADDEN 3:00pm Pt does not have keys to her apartment, pt's daughter is willing to pick pt up around 11am tomorrow morning. MD and RN made aware.   Expected Discharge Plan: Home/Self Care Barriers to Discharge: Continued Medical Work up  Expected Discharge Plan and Services       Living arrangements for the past 2 months: [place]                                       Social Determinants of Health (SDOH) Interventions SDOH Screenings   Food Insecurity: No Food Insecurity (10/28/2022)  Housing: Low Risk  (10/28/2022)  Transportation Needs: No Transportation Needs (10/28/2022)  Utilities: Not At Risk (10/28/2022)    Readmission Risk Interventions     No data to display

## 2022-10-30 NOTE — Progress Notes (Signed)
Physical Therapy Treatment Patient Details Name: Nicole [name] MRN: [id number] DOB: 11/26/1934 Today's Date: 10/30/2022   History of Present Illness 88 y.o. female with medical history significant of chronic diastolic HF, hypothyroidism, hypertension, hyperlipidemia recently discharged on 10/08/2022 after presenting with acute respiratory failure secondary to pneumonia with underlying history of bronchiectasis , was also found to have acute pulmonary embolism, AKI. Presented to ED with sudden [facility]. Patient admitted for possible pneumona and found to have AKI and hyponatremia    PT Comments    Pt in recliner on arrival and very agreeable to ambulate.  Pt ambulated in hallway good distance however reports her dining hall is a 10 min walk.  Pt very talkative during session even during ambulation and denied dyspnea.    Recommendations for follow up therapy are one component of a multi-disciplinary discharge planning process, led by the attending physician.  Recommendations may be updated based on patient status, additional functional criteria and insurance authorization.  Follow Up Recommendations  Home health PT Can patient physically be transported by private vehicle: Yes   Assistance Recommended at Discharge Intermittent Supervision/Assistance  Patient can return home with the following A little help with walking and/or transfers;A little help with bathing/dressing/bathroom;Assistance with cooking/housework;Assist for transportation;Direct supervision/assist for medications management;Direct supervision/assist for financial management;Help with stairs or ramp for entrance   Equipment Recommendations  None recommended by PT    Recommendations for Other Services       Precautions / Restrictions Precautions Precautions: Fall     Mobility  Bed Mobility               General bed mobility comments: OOB in recliner    Transfers Overall transfer level: Needs assistance Equipment  used: Rolling walker (2 wheels) Transfers: Sit to/from Stand Sit to Stand: Supervision                Ambulation/Gait Ambulation/Gait assistance: Min guard Gait Distance (Feet): 200 Feet Assistive device: Rolling walker (2 wheels) Gait Pattern/deviations: Step-through pattern, Decreased stride length       General Gait Details: slow but steady with walker, distance to tolerance, denies dyspnea   Stairs             Wheelchair Mobility    Modified Rankin (Stroke Patients Only)       Balance                                            Cognition Arousal/Alertness: Awake/alert Behavior During Therapy: WFL for tasks assessed/performed Overall Cognitive Status: History of cognitive impairments - at baseline                                 General Comments: pt reports she has a hx of some forgetfulness (per last session); very loquacious        Exercises      General Comments        Pertinent Vitals/Pain Pain Assessment Pain Assessment: No/denies pain    Home Living                          Prior Function            PT Goals (current goals can now be found in the care plan section) Progress towards PT goals: Progressing  toward goals    Frequency    Min 3X/week      PT Plan Current plan remains appropriate    Co-evaluation              AM-PAC PT "6 Clicks" Mobility   Outcome Measure  Help needed turning from your back to your side while in a flat bed without using bedrails?: A Little Help needed moving from lying on your back to sitting on the side of a flat bed without using bedrails?: A Little Help needed moving to and from a bed to a chair (including a wheelchair)?: A Little Help needed standing up from a chair using your arms (e.g., wheelchair or bedside chair)?: A Little Help needed to walk in hospital room?: A Little Help needed climbing 3-5 steps with a railing? : A Little 6 Click  Score: 18    End of Session Equipment Utilized During Treatment: Gait belt Activity Tolerance: Patient tolerated treatment well Patient left: in chair;with call bell/phone within reach;with chair alarm set   PT Visit Diagnosis: Muscle weakness (generalized) (M62.81);Difficulty in walking, not elsewhere classified (R26.2)     Time: 1583-0940 PT Time Calculation (min) (ACUTE ONLY): 11 min  Charges:  $Gait Training: 8-22 mins                    Jannette Spanner PT, DPT Physical Therapist Acute Rehabilitation Services Preferred contact method: Secure Chat Weekend Pager Only: [phone] Office: [place] 10/30/2022, 1:25 PM

## 2022-10-31 DIAGNOSIS — J189 Pneumonia, unspecified organism: Secondary | ICD-10-CM | POA: Diagnosis not present

## 2022-10-31 NOTE — Discharge Summary (Signed)
Physician Discharge Summary   Patient: Nicole [name] MRN: [id number] DOB: [birth year]  Admit date:     10/28/2022  Discharge date: 10/30/22  Discharge Physician: Marguerita Merles, DO   PCP: Mercy Moore, MD   Recommendations at discharge:   Follow-up with PCP within 1 to 2 weeks and repeat CBC, CMP, mag, Phos within 1 week Follow-up with pulmonary in outpatient setting within 1 to 2 weeks for post hospitalization bronchiectasis evaluation Follow-up with neurology outpatient setting for further dementia testing  Discharge Diagnoses: Principal Problem:   HCAP (healthcare-associated pneumonia) Active Problems:   AKI (acute kidney injury) (HCC)   Hyponatremia  Resolved Problems:   * No resolved hospital problems. [facility] Course: The patient is an 88 year old underweight chronically ill-appearing Caucasian female with past medical history significant for but not limited to chronic diastolic CHF, hypothyroidism, hypertension, hyperlipidemia as well as other comorbidities who was recently discharged on 10/09/2019 after presenting with acute respiratory failure with secondary to pneumonia with underlying history of bronchiectasis and was found to have an acute pulmonary embolism as well as AKI at that time.  During that admission the patient was intubated and was on pressors.  She eventually stabilized and was sent back to ILF.  Yesterday she presented to the ED with concerns of shortness of breath for day and reports that she went to go get something in the kitchen and had an intense feeling of shortness of breath and felt like she was having a panic attack.  Patient was assessed by the staff at her ILF and they recommended her come to the ER to be evaluated.  In the ED patient reported symptoms completely resolved and denied any further shortness of breath, chest discomfort fevers or chills.  She does report some chronic cough with whitish sputum but of note there was some strong suspicion of mild  cognitive impairment.  In the ED she was noted to have an AKI and hyponatremia and was admitted for evaluation and management.   **Interim History She is getting IVF Hydration and Renal Fxn is improving. She will need an ambulatory home O2 screen prior to D/C and she did not desaturate on amatory home O2 screen.  She is much improved and renal function is back to her baseline.  She is medically stable for discharge at this time will need to follow-up with PCP as well as pulmonary in outpatient setting.  We recommend outpatient neurology evaluation for mild cognitive impairment and dementia evaluation.  ADDENDUM 10/31/22: She was deemed medically stable to D/C back to ILF yesterday but unfortunately could not go due to her not having a key and lack of transportation.  She is remains medically stable for discharge and will need to follow-up with her PCP as well as pulmonologist in outpatient setting and will be discharged today as her daughter is picking her up.  Assessment and Plan:  ??HCAP, ruled out  History of underlying bronchiectasis -Afebrile, no leukocytosis -Chest x-ray showed potential developing multilobar bronchopneumonia most evident in the left upper lobe, basilar opacities -CT chest without contrast done and showed "Persistent but improving opacification over the right lower lobe with mild volume loss as findings may be due to improving atelectasis or infection. Resolved small right effusion. Significant improvement with moderate clearing of the previously seen material within the left mainstem bronchus and lower lobe bronchi. Improved left base opacification. Recommend follow-up to resolution. Few nodular densities within the left mid to lower lung without significant change. Stable mediastinal adenopathy  as described which may be reactive. Findings may be due to an infectious, atypical infectious or inflammatory process and may be related to patient's known aspiration. Recommend additional  follow-up CT in 6 weeks. Mild emphysematous disease with mild bibasilar bronchiectasis. Aortic atherosclerosis. Atherosclerotic coronary artery disease. Aortic Atherosclerosis and Emphysema " -BC X 2 showed NGTD at 2 Days -Urinalysis unremarkable, UC showing No Growth -Was initiated on IV Cefepime but since afebrile and has no Leukocytosis or symptoms will observe off of Antibiotics; Given Zyvox yesterday -Continue nebulizers with DuoNeb q2hprn Wheezing and SOB, Inhalers with Breo-Ellipta 1 puff IH Daily and Incruse Ellipta 1 puff IH Daily,  -Initiated Montelukast 10 mg po Daily  -Continue to Monitor closely in the progressive floor given recent history of PE -Repeat CXR in the outpatient setting within 3-6 weeks and follow up Pulmonary int eh outpateint    AKI -Baseline Cr normal, on admission creatinine 2.56 -Renal ultrasound with no obstructive uropathy -Likely 2/2 lisinopril/HCTZ, will hold, unsure if patient was on Lasix prior to arrival; Will D/C Lisinopirl-HCTZ at D/C and just start Lisinopril 5 mg po Daily  -Gentle IVF hydration with NS at 75 mL/hr to stop today  -BUN/Cr Trend: Recent Labs  Lab 10/06/22 0600 10/08/22 0151 10/28/22 0534 10/28/22 1325 10/29/22 0411 10/30/22 0412 10/30/22 1012  BUN 21 19 34* 31* 26* 18 16  CREATININE 0.85 0.80 2.56* 2.07* 1.52* 1.07* 0.93   -Strict I's and O's and Daily Weights   -Avoid Nephrotoxic Medications, Contrast Dyes, Hypotension and Dehydration to Ensure Adequate Renal Perfusion and will need to Renally Adjust Meds -Continue to Monitor and Trend Renal Function carefully and repeat CMP in the AM    Hyponatremia -IVF to stop today  -Na+ Trend Recent Labs  Lab 10/06/22 0600 10/08/22 0151 10/28/22 0534 10/28/22 1325 10/29/22 0411 10/30/22 0412 10/30/22 1012  NA 137 141 127* 130* 136 139 135   -Continue to Monitor and Trend and repeat CMP in the AM    History of PE -Diagnosed on 09/2022 -Continue Anticoagulation with Apixaban  5 mg po BID    History of Chronic Diastolic HF -Appears euvolemic today  -BNP 371, on last admission was 755 -Chest x-ray done and showed "The appearance of the chest suggests bronchitis, with potential developing multilobar bronchopneumonia most evident left upper lobe. Bibasilar opacities are also noted, which may reflect additional areas of atelectasis and/or consolidation. Aortic atherosclerosis." -Echo done on 09/2022 showed EF of 55 to 60% with grade 3 diastolic dysfunction -Strict I's and O's and Daily Weights; She is +3.069 Liters -IVF to stop today    Hypertension -BP stable -Continue Metoprolol Succinate 50 mg po qHS, Verapamil 180 mg po qHS, and will continue to hold lisinopril/HCTZ given renal Dysfunction -Continue to Monitor BP per Protocol -Last BP reading was 117/69   Hypothyroidism -TSH 10.4, free T4-->1.53 -Unsure if patient was taking her medication and not -Will continue Levothyroxine 75 mcg po Daily  -Will need Repeat TFTs in 4-6 weeks    Normocytic Anemia -Hgb/Hct Trend: Recent Labs  Lab 10/07/22 0214 10/07/22 1829 10/08/22 0151 10/28/22 0534 10/29/22 0411 10/30/22 0412 10/30/22 1012  HGB 7.8* 8.8* 8.3* 9.4* 8.8* 9.3* 8.7*  HCT 23.7* 27.3* 25.6* 28.4* 26.4* 29.2* 27.3*  MCV 86.5  --  88.0 86.1 86.6 89.6 88.6   -Checked Anemia Panel and showed an iron level of 46, UIBC of 340, TIBC 386, saturation ratios of 12%, ferritin level of 11, folate level 5.1, vitamin B12 level 563 -Will start  folate supplementation as well as a daily multivitamin -Continue to Monitor for S/Sx of Bleeding; No overt bleeding noted -Repeat CBC in the AM    Underweight -Estimated body mass index is 17.29 kg/m as calculated from the following:   Height as of this encounter: 5\' 3"  (1.6 m).   Weight as of this encounter: 44.3 kg. -Nutritionist consulted for further evaluation recommendations and they are recommending a heart healthy diet as well as Ensure Plus high-protein p.o.  twice daily and daily multivitamin and monitoring weight trends Nutrition Documentation    Flowsheet Row ED to Hosp-Admission (Current) from 10/28/2022 in  4TH FLOOR PROGRESSIVE CARE AND UROLOGY  Nutrition Problem Increased nutrient needs  Etiology acute illness  Nutrition Goal Patient will meet greater than or equal to 90% of their needs  Interventions Ensure Enlive (each supplement provides 350kcal and 20 grams of protein), Refer to RD note for recommendations      Consultants: None Procedures performed: As delineated as above  Disposition: Home health Diet recommendation:  Discharge Diet Orders (From admission, onward)     Start     Ordered   10/30/22 0000  Diet - low sodium heart healthy        10/30/22 1435           Cardiac diet DISCHARGE MEDICATION: Allergies as of 10/31/2022       Reactions   Codeine Nausea Only, Other (See Comments)   Dizziness    Penicillins Other (See Comments)   Unknown        Medication List     STOP taking these medications    lisinopril-hydrochlorothiazide 20-12.5 MG tablet Commonly known as: ZESTORETIC   Spiriva HandiHaler 18 MCG inhalation capsule Generic drug: tiotropium       TAKE these medications    acetaminophen 325 MG tablet Commonly known as: TYLENOL Take 325 mg by mouth daily as needed for moderate pain or headache.   albuterol 108 (90 Base) MCG/ACT inhaler Commonly known as: VENTOLIN HFA Inhale 2 puffs into the lungs every 4 (four) hours as needed for wheezing or shortness of breath.   apixaban 5 MG Tabs tablet Commonly known as: ELIQUIS Take 1 tablet (5 mg total) by mouth 2 (two) times daily. What changed: Another medication with the same name was removed. Continue taking this medication, and follow the directions you see here.   atorvastatin 20 MG tablet Commonly known as: LIPITOR Take 20 mg by mouth daily.   fluticasone-salmeterol 250-50 MCG/ACT Aepb Commonly known as: ADVAIR Inhale 1 puff  into the lungs 2 (two) times daily.   folic acid 1 MG tablet Commonly known as: FOLVITE Take 1 tablet (1 mg total) by mouth daily.   Incruse Ellipta 62.5 MCG/ACT Aepb Generic drug: umeclidinium bromide Inhale 1 puff into the lungs daily.   latanoprost 0.005 % ophthalmic solution Commonly known as: XALATAN Place 1 drop into both eyes at bedtime.   levocetirizine 5 MG tablet Commonly known as: XYZAL Take 5 mg by mouth daily as needed for allergies.   levofloxacin 750 MG tablet Commonly known as: LEVAQUIN Take 750 mg by mouth daily.   levothyroxine 75 MCG tablet Commonly known as: SYNTHROID Take 75 mcg by mouth daily.   lisinopril 5 MG tablet Commonly known as: ZESTRIL Take 1 tablet (5 mg total) by mouth daily.   meclizine 25 MG tablet Commonly known as: ANTIVERT Take 12.5 mg by mouth daily as needed for dizziness.   metoprolol succinate 50 MG 24 hr tablet  Commonly known as: TOPROL-XL Take 50 mg by mouth at bedtime.   montelukast 10 MG tablet Commonly known as: SINGULAIR Take 10 mg by mouth daily.   multivitamin with minerals Tabs tablet Take 1 tablet by mouth daily.   ondansetron 4 MG tablet Commonly known as: ZOFRAN Take 1 tablet (4 mg total) by mouth every 6 (six) hours as needed for nausea.   pantoprazole 40 MG tablet Commonly known as: PROTONIX Take 1 tablet (40 mg total) by mouth 2 (two) times daily.   polyethylene glycol 17 g packet Commonly known as: MIRALAX / GLYCOLAX Take 17 g by mouth daily.   senna-docusate 8.6-50 MG tablet Commonly known as: Senokot-S Take 1 tablet by mouth at bedtime.   verapamil 180 MG CR tablet Commonly known as: CALAN-SR Take 180 mg by mouth at bedtime.        Discharge Exam: Filed Weights   10/28/22 0736 10/28/22 2132  Weight: 47.2 kg 44.3 kg   Vitals:   10/31/22 0510 10/31/22 0818  BP: 123/77   Pulse: (!) 52   Resp: 18 (!) 21  Temp: 98.2 F (36.8 C)   SpO2: 95% 95%   Examination: Physical  Exam:  Constitutional: WN/WD thin Caucasian female currently no acute distress appears calm sitting in chair at bedside Respiratory: Diminished to auscultation bilaterally with some coarse breath sounds, no wheezing, rales, rhonchi or crackles. Normal respiratory effort and patient is not tachypenic. No accessory muscle use.  Unlabored breathing and not wearing supplemental oxygen via nasal cannula Cardiovascular: RRR, no murmurs / rubs / gallops. S1 and S2 auscultated. No extremity edema.  Abdomen: Soft, non-tender, non-distended. Bowel sounds positive.  GU: Deferred. Musculoskeletal: No clubbing / cyanosis of digits/nails. No joint deformity upper and lower extremities.  Skin: No rashes, lesions, ulcers limited skin evaluation. No induration; Warm and dry.  Neurologic: CN 2-12 grossly intact with no focal deficits. Romberg sign and cerebellar reflexes not assessed.  Psychiatric: Normal judgment and insight. Alert and oriented x 3.  Slightly anxious mood and appropriate affect.   Condition at discharge: stable  The results of significant diagnostics from this hospitalization (including imaging, microbiology, ancillary and laboratory) are listed below for reference.   Imaging Studies: CT CHEST WO CONTRAST  Result Date: 10/28/2022 CLINICAL DATA:  Pneumonia.  Shortness of breath. EXAM: CT CHEST WITHOUT CONTRAST TECHNIQUE: Multidetector CT imaging of the chest was performed following the standard protocol without IV contrast. RADIATION DOSE REDUCTION: This exam was performed according to the departmental dose-optimization program which includes automated exposure control, adjustment of the mA and/or kV according to patient size and/or use of iterative reconstruction technique. COMPARISON:  10/02/2022, 09/29/2022 FINDINGS: Cardiovascular: Heart is normal size. Calcified plaque over the left main and coronary arteries. Mild calcification over the aortic root. Thoracic aorta is normal in caliber. There  is calcified plaque over the descending thoracic aorta. Remaining vascular structures are unremarkable on this noncontrast exam. Mediastinum/Nodes: Stable AP window lymph node measuring 2.3 cm by short axis. Stable 1 cm precarinal lymph node. No definite hilar adenopathy on this noncontrast exam. Suggestion of small sliding hiatal hernia. Remaining mediastinal structures are unremarkable. Lungs/Pleura: Lungs are adequately inflated with mild emphysematous disease. Persistent but improving opacification over the right lower lobe with mild volume loss of the right lower lobe as findings may be due to improving atelectasis or infection. Resolved small right effusion. Mild bronchiectatic changes in the right base. Significant improvement with moderate clearing of the previously seen material within the left mainstem  bronchus and lower lobe bronchi. Improved left base opacification with mild left lower lobe bronchiectatic change. Few nodular densities within the left mid to lower lung without significant change. There is a 8.2 mm nodule over the left lower lobe (image 77), 6.5 mm nodule (image 86) and 1.1 cm nodule (image 64). Upper Abdomen: Calcified plaque over the abdominal aorta. No acute findings. Stable calcification over the left lobe of the liver. Mild prominence of the right intrarenal collecting system which is not fully evaluated, although was normal on renal ultrasound earlier today. Musculoskeletal: Degenerative changes of the spine. No focal abnormality. IMPRESSION: 1. Persistent but improving opacification over the right lower lobe with mild volume loss as findings may be due to improving atelectasis or infection. Resolved small right effusion. Significant improvement with moderate clearing of the previously seen material within the left mainstem bronchus and lower lobe bronchi. Improved left base opacification. Recommend follow-up to resolution. 2. Few nodular densities within the left mid to lower lung  without significant change. Stable mediastinal adenopathy as described which may be reactive. Findings may be due to an infectious, atypical infectious or inflammatory process and may be related to patient's known aspiration. Recommend additional follow-up CT in 6 weeks. 3. Mild emphysematous disease with mild bibasilar bronchiectasis. 4. Aortic atherosclerosis. Atherosclerotic coronary artery disease. Aortic Atherosclerosis (ICD10-I70.0) and Emphysema (ICD10-J43.9). Electronically Signed   By: Elberta Fortis M.D.   On: 10/28/2022 16:47   US RENAL  Result Date: 10/28/2022 CLINICAL DATA:  [id number] Renal failure [id number] EXAM: RENAL / URINARY TRACT ULTRASOUND COMPLETE COMPARISON:  None Available. FINDINGS: The right kidney measured 9.7 cm and the left kidney measured 8.1 cm. The kidneys demonstrate increased echogenicity consistent with chronic medical renal disease. No renal parenchymal lesions are identified. No shadowing stones are seen. The urinary bladder appeared unremarkable. Bladder prevoid volume was 460 mL. Postvoid images not obtained. IMPRESSION: Echogenic kidneys consistent with chronic medical renal disease. Otherwise unremarkable examination of the kidneys and urinary bladder. Electronically Signed   By: Layla Maw M.D.   On: 10/28/2022 10:33   DG Chest 2 View  Result Date: 10/28/2022 CLINICAL DATA:  88 year old female with history of dyspnea. EXAM: CHEST - 2 VIEW COMPARISON:  Chest x-ray 10/02/2022. FINDINGS: Mild elevation of the left hemidiaphragm. Ill-defined bibasilar opacities which may reflect areas of atelectasis and/or consolidation. Patchy areas of interstitial prominence and peribronchial cuffing in the lungs bilaterally, along with some ill-defined opacities most evident in the left upper lobe, concerning for potential bronchitis and developing multilobar bronchopneumonia. No pneumothorax. No evidence of pulmonary edema. Heart size is normal. Upper mediastinal contours are within  normal limits. Atherosclerosis in the thoracic aorta. IMPRESSION: 1. The appearance of the chest suggests bronchitis, with potential developing multilobar bronchopneumonia most evident left upper lobe. 2. Bibasilar opacities are also noted, which may reflect additional areas of atelectasis and/or consolidation. 3. Aortic atherosclerosis. Electronically Signed   By: Trudie Reed M.D.   On: 10/28/2022 06:53   VAS [place] LOWER EXTREMITY VENOUS (DVT)  Result Date: 10/03/2022  Lower Venous DVT Study Patient Name:  LEEZA HEINER  Date of Exam:   10/03/2022 Medical Rec #: [id number]      Accession #:    [id number] Date of Birth: 1935/09/23      Patient Gender: F Patient Age:   3 years Exam Location:  [facility] Procedure:      VAS [place] LOWER EXTREMITY VENOUS (DVT) Referring Phys: DEBBY CROSLEY --------------------------------------------------------------------------------  Indications: Pulmonary embolism.  Comparison  Study: no prior Performing Technologist: [place] Ponder RVS  Examination Guidelines: A complete evaluation includes B-mode imaging, spectral Doppler, color Doppler, and power Doppler as needed of all accessible portions of each vessel. Bilateral testing is considered an integral part of a complete examination. Limited examinations for reoccurring indications may be performed as noted. The reflux portion of the exam is performed with the patient in reverse Trendelenburg.  +---------+---------------+---------+-----------+----------+--------------+ RIGHT    CompressibilityPhasicitySpontaneityPropertiesThrombus Aging +---------+---------------+---------+-----------+----------+--------------+ CFV      Full           Yes      Yes                                 +---------+---------------+---------+-----------+----------+--------------+ SFJ      Full                                                        +---------+---------------+---------+-----------+----------+--------------+ FV  Prox  Full                                                        +---------+---------------+---------+-----------+----------+--------------+ FV Mid   Full                                                        +---------+---------------+---------+-----------+----------+--------------+ FV DistalFull                                                        +---------+---------------+---------+-----------+----------+--------------+ PFV      Full                                                        +---------+---------------+---------+-----------+----------+--------------+ POP      Full           Yes      Yes                                 +---------+---------------+---------+-----------+----------+--------------+ PTV      Full                                                        +---------+---------------+---------+-----------+----------+--------------+ PERO     Full                                                        +---------+---------------+---------+-----------+----------+--------------+   +---------+---------------+---------+-----------+----------+--------------+  LEFT     CompressibilityPhasicitySpontaneityPropertiesThrombus Aging +---------+---------------+---------+-----------+----------+--------------+ CFV      Full           Yes      Yes                                 +---------+---------------+---------+-----------+----------+--------------+ SFJ      Full                                                        +---------+---------------+---------+-----------+----------+--------------+ FV Prox  Full                                                        +---------+---------------+---------+-----------+----------+--------------+ FV Mid   Full                                                        +---------+---------------+---------+-----------+----------+--------------+ FV DistalFull                                                         +---------+---------------+---------+-----------+----------+--------------+ PFV      Full                                                        +---------+---------------+---------+-----------+----------+--------------+ POP      Full           Yes      Yes                                 +---------+---------------+---------+-----------+----------+--------------+ PTV      Full                                                        +---------+---------------+---------+-----------+----------+--------------+ PERO     Full                                                        +---------+---------------+---------+-----------+----------+--------------+     Summary: BILATERAL: - No evidence of deep vein thrombosis seen in the lower extremities, bilaterally. -No evidence of popliteal cyst, bilaterally.   *See table(s) above for measurements and observations. Electronically signed by Heath Lark on 10/03/2022 at 6:04:43 PM.  Final    CT Angio Chest Pulmonary Embolism (PE) W or WO Contrast  Result Date: 10/02/2022 CLINICAL DATA:  Short of breath, hypoxia, elevated D-dimer EXAM: CT ANGIOGRAPHY CHEST WITH CONTRAST TECHNIQUE: Multidetector CT imaging of the chest was performed using the standard protocol during bolus administration of intravenous contrast. Multiplanar CT image reconstructions and MIPs were obtained to evaluate the vascular anatomy. RADIATION DOSE REDUCTION: This exam was performed according to the departmental dose-optimization program which includes automated exposure control, adjustment of the mA and/or kV according to patient size and/or use of iterative reconstruction technique. CONTRAST:  60mL OMNIPAQUE IOHEXOL 350 MG/ML SOLN COMPARISON:  10/02/2022, 09/29/2022 FINDINGS: Cardiovascular: This is a technically adequate evaluation of the pulmonary vasculature. There is a filling defect within the anterior basilar segmental branch of the right lower  lobe pulmonary artery, with peripheral lung consolidation consistent with pulmonary embolus and infarct. No other pulmonary emboli are identified. The heart is unremarkable without pericardial effusion. No evidence of thoracic aortic aneurysm or dissection. Atherosclerosis of the aorta and coronary vasculature. Mediastinum/Nodes: Continued adenopathy within the AP window measuring up to 2.4 cm in short axis. Thyroid, trachea, and esophagus are unremarkable. Lungs/Pleura: Right shaped consolidation and volume loss within the right lower lobe consistent with pulmonary infarct. Trace bilateral pleural effusions, right greater than left. Stable left pleural calcifications. There is opacification of the left mainstem bronchus as well as the upper and lower lobe segmental bronchi, consistent with mucoid impaction, retained secretions, or aspiration. Patchy airspace disease throughout the left lung consistent with inflammatory or infectious etiology, without significant change since prior exam. No pneumothorax. Upper Abdomen: No acute abnormality. Musculoskeletal: Chronic postsurgical or posttraumatic changes of the left thoracic cage, stable. No acute displaced fracture. Reconstructed images demonstrate no additional findings. Review of the MIP images confirms the above findings. IMPRESSION: 1. Findings consistent with right lower lobe segmental pulmonary embolus, and associated pulmonary infarct. Minimal clot burden. No evidence of right heart strain. 2. Persistent nodular airspace disease throughout the left lung, compatible with inflammatory or infectious etiology. As per previous recommendation, short interval follow-up recommended to document resolution after appropriate medical management. 3. Opacification of the left mainstem bronchus and segmental upper and lower lobe bronchi, consistent with retained secretions, mucoid impaction, or aspiration. 4. Stable adenopathy at the AP window. 5. Aortic Atherosclerosis  (ICD10-I70.0). Coronary artery atherosclerosis. Critical Value/emergent results were called by telephone at the time of interpretation on 10/02/2022 at 8:34 pm to provider DR CROSLEY, who verbally acknowledged these results. Electronically Signed   By: Sharlet SalinaMichael  Brown M.D.   On: 10/02/2022 20:36   DG CHEST PORT 1 VIEW  Result Date: 10/02/2022 CLINICAL DATA:  Shortness of breath. EXAM: PORTABLE CHEST 1 VIEW COMPARISON:  CT chest and chest x-ray dated September 29, 2022. FINDINGS: Interval extubation. Unchanged cardiomegaly. Similar left basilar patchy reticulonodular opacities. Improving interstitial opacity at the right lung base. Unchanged small bilateral pleural effusions. No pneumothorax. No acute osseous abnormality. IMPRESSION: 1. Unchanged left basilar and improving right basilar pneumonia. 2. Unchanged small bilateral pleural effusions. Electronically Signed   By: Obie DredgeWilliam T Derry M.D.   On: 10/02/2022 10:57   DG Hand Complete Left  Result Date: 10/01/2022 CLINICAL DATA:  Left hand pain. EXAM: LEFT HAND - COMPLETE 3+ VIEW COMPARISON:  None Available. FINDINGS: There is diffuse decreased bone mineralization. Mild-to-moderate interphalangeal joint space narrowing and peripheral osteophytosis diffusely throughout the first through fifth digits. Moderate thumb carpometacarpal joint space narrowing, subchondral sclerosis, and peripheral osteophytosis. No acute fracture is  seen.  No dislocation. Mild calcification overlying the triangular fibrocartilage complex. IMPRESSION: 1. Moderate thumb carpometacarpal osteoarthritis. 2. Mild-to-moderate interphalangeal osteoarthritis diffusely throughout the first through fifth digits. Electronically Signed   By: Yvonne Kendall M.D.   On: 10/01/2022 14:56   DG Shoulder Right  Result Date: 10/01/2022 CLINICAL DATA:  Right shoulder pain. EXAM: RIGHT SHOULDER - 2+ VIEW COMPARISON:  None Available. FINDINGS: There is diffuse decreased bone mineralization. Moderate to  severe inferior glenohumeral joint space narrowing. Moderate inferior humeral head-neck junction degenerative osteophytosis. Mild peripheral glenoid degenerative osteophytosis. Mild-to-moderate superomedial humeral head cortical flattening/remodeling with diffuse subchondral sclerosis and mild subchondral cystic change. Mild acromioclavicular joint space narrowing and peripheral osteophytosis. No acute fracture or dislocation. The visualized portion of the right lung is unremarkable. Mild-to-moderate atherosclerotic calcifications within the aortic arch. IMPRESSION: 1. 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 2. Mild acromioclavicular osteoarthritis. Electronically Signed   By: Yvonne Kendall M.D.   On: 10/01/2022 14:55    Microbiology: Results for orders placed or performed during the hospital encounter of 10/28/22  Resp panel by RT-PCR (RSV, Flu A&B, Covid) Anterior Nasal Swab     Status: None   Collection Time: 10/28/22  5:54 AM   Specimen: Anterior Nasal Swab  Result Value Ref Range Status   SARS Coronavirus 2 by RT PCR NEGATIVE NEGATIVE Final    Comment: (NOTE) SARS-CoV-2 target nucleic acids are NOT DETECTED.  The SARS-CoV-2 RNA is generally detectable in upper respiratory specimens during the acute phase of infection. The lowest concentration of SARS-CoV-2 viral copies this assay can detect is 138 copies/mL. A negative result does not preclude SARS-Cov-2 infection and should not be used as the sole basis for treatment or other patient management decisions. A negative result may occur with  improper specimen collection/handling, submission of specimen other than nasopharyngeal swab, presence of viral mutation(s) within the areas targeted by this assay, and inadequate number of viral copies(<138 copies/mL). A negative result must be combined with clinical observations, patient history, and epidemiological information. The expected result is Negative.  Fact Sheet for Patients:   EntrepreneurPulse.com.au  Fact Sheet for Healthcare Providers:  IncredibleEmployment.be  This test is no t yet approved or cleared by the [place] FDA and  has been authorized for detection and/or diagnosis of SARS-CoV-2 by FDA under an Emergency Use Authorization (EUA). This EUA will remain  in effect (meaning this test can be used) for the duration of the COVID-19 declaration under Section 564(b)(1) of the Act, 21 U.S.C.section 360bbb-3(b)(1), unless the authorization is terminated  or revoked sooner.       Influenza A by PCR NEGATIVE NEGATIVE Final   Influenza B by PCR NEGATIVE NEGATIVE Final    Comment: (NOTE) The Xpert Xpress SARS-CoV-2/FLU/RSV plus assay is intended as an aid in the diagnosis of influenza from Nasopharyngeal swab specimens and should not be used as a sole basis for treatment. Nasal washings and aspirates are unacceptable for Xpert Xpress SARS-CoV-2/FLU/RSV testing.  Fact Sheet for Patients: EntrepreneurPulse.com.au  Fact Sheet for Healthcare Providers: IncredibleEmployment.be  This test is not yet approved or cleared by the [place] FDA and has been authorized for detection and/or diagnosis of SARS-CoV-2 by FDA under an Emergency Use Authorization (EUA). This EUA will remain in effect (meaning this test can be used) for the duration of the COVID-19 declaration under Section 564(b)(1) of the Act, 21 U.S.C. section 360bbb-3(b)(1), unless the authorization is terminated or revoked.     Resp Syncytial Virus by PCR NEGATIVE NEGATIVE Final  Comment: (NOTE) Fact Sheet for Patients: BloggerCourse.com  Fact Sheet for Healthcare Providers: SeriousBroker.it  This test is not yet approved or cleared by the [place] FDA and has been authorized for detection and/or diagnosis of SARS-CoV-2 by FDA under an Emergency Use  Authorization (EUA). This EUA will remain in effect (meaning this test can be used) for the duration of the COVID-19 declaration under Section 564(b)(1) of the Act, 21 U.S.C. section 360bbb-3(b)(1), unless the authorization is terminated or revoked.  Performed at [facility], [address] [place], [place] [address]   Culture, blood (routine x 2)     Status: None (Preliminary result)   Collection Time: 10/28/22  7:23 AM   Specimen: BLOOD  Result Value Ref Range Status   Specimen Description   Final    BLOOD SITE NOT SPECIFIED Performed at [facility], [address], [place], [place] [address]    Special Requests   Final    BOTTLES DRAWN AEROBIC AND ANAEROBIC Blood Culture adequate volume Performed at [facility], [address], [place], [place] [address]    Culture   Final    NO GROWTH 2 DAYS Performed at [facility], [address], [place], [place] [address]    Report Status PENDING  Incomplete  Culture, blood (routine x 2)     Status: None (Preliminary result)   Collection Time: 10/28/22  7:45 AM   Specimen: BLOOD  Result Value Ref Range Status   Specimen Description   Final    BLOOD SITE NOT SPECIFIED Performed at [facility], [address], [place], [place] [address]    Special Requests   Final    BOTTLES DRAWN AEROBIC AND ANAEROBIC Blood Culture adequate volume Performed at [facility], [address], [place], [place] [address]    Culture   Final    NO GROWTH 2 DAYS Performed at [facility], [address] [place], [place] [address]    Report Status PENDING  Incomplete  Urine Culture     Status: None   Collection Time: 10/28/22 12:26 PM   Specimen: In/Out Cath Urine  Result Value Ref Range Status   Specimen Description   Final    IN/OUT CATH URINE Performed at [facility], [address], [place], [place] [address]     Special Requests   Final    NONE Performed at [facility], [address] [place], [place] [address]    Culture   Final    NO GROWTH Performed at [facility], [address], [place], [place] [address]    Report Status 10/29/2022 FINAL  Final   Labs: CBC: Recent Labs  Lab 10/28/22 0534 10/29/22 0411 10/30/22 0412 10/30/22 1012  WBC 8.8 5.6 8.9 7.9  NEUTROABS 6.5  --  4.8  --   HGB 9.4* 8.8* 9.3* 8.7*  HCT 28.4* 26.4* 29.2* 27.3*  MCV 86.1 86.6 89.6 88.6  PLT 314 245 297 272    Basic Metabolic Panel: Recent Labs  Lab 10/28/22 0534 10/28/22 1325 10/29/22 0411 10/30/22 0412 10/30/22 1012  NA 127* 130* 136 139 135  K 4.4 3.8 4.0 4.1 4.1  CL 96* 98 105 109 105  CO2 21* 23 25 23 24   GLUCOSE 105* 98 85 99 144*  BUN 34* 31* 26* 18 16  CREATININE 2.56* 2.07* 1.52* 1.07* 0.93  CALCIUM 9.5 8.8* 9.0 9.1 8.9  MG  --   --   --  1.8  --   PHOS  --   --   --  3.2  --     Liver Function Tests: Recent Labs  Lab 10/28/22 0534 10/30/22 0412  AST 18 17  ALT 14 12  ALKPHOS 75 65  BILITOT 0.5 0.3  PROT 6.7 5.8*  ALBUMIN 3.7 3.2*    CBG: No results for input(s): "GLUCAP" in the last 168 hours.  Discharge time spent: greater than 30 minutes.  Signed: Marguerita MerlesOmair Joshu Furukawa, DO [facility] 10/31/2022

## 2022-11-02 LAB — CULTURE, BLOOD (ROUTINE X 2)
Culture: NO GROWTH
Culture: NO GROWTH
Special Requests: ADEQUATE
Special Requests: ADEQUATE

## 2022-11-18 DIAGNOSIS — Z86711 Personal history of pulmonary embolism: Secondary | ICD-10-CM

## 2022-11-18 HISTORY — DX: Personal history of pulmonary embolism: Z86.711

## 2023-02-16 ENCOUNTER — Other Ambulatory Visit: Payer: Medicare Other

## 2023-03-10 ENCOUNTER — Emergency Department (HOSPITAL_COMMUNITY): Payer: Medicare Other

## 2023-03-10 ENCOUNTER — Inpatient Hospital Stay (HOSPITAL_COMMUNITY)
Admission: EM | Admit: 2023-03-10 | Discharge: 2023-03-12 | DRG: 543 | Disposition: A | Discharge status: Home Health | Payer: Medicare Other | Source: Skilled Nursing Facility | Attending: Internal Medicine | Admitting: Internal Medicine

## 2023-03-10 ENCOUNTER — Encounter (HOSPITAL_COMMUNITY): Payer: Self-pay | Admitting: Internal Medicine

## 2023-03-10 ENCOUNTER — Observation Stay (HOSPITAL_COMMUNITY): Payer: Medicare Other

## 2023-03-10 ENCOUNTER — Other Ambulatory Visit: Payer: Self-pay

## 2023-03-10 DIAGNOSIS — J471 Bronchiectasis with (acute) exacerbation: Secondary | ICD-10-CM

## 2023-03-10 DIAGNOSIS — E785 Hyperlipidemia, unspecified: Secondary | ICD-10-CM | POA: Diagnosis present

## 2023-03-10 DIAGNOSIS — Z66 Do not resuscitate: Secondary | ICD-10-CM | POA: Diagnosis present

## 2023-03-10 DIAGNOSIS — S72001A Fracture of unspecified part of neck of right femur, initial encounter for closed fracture: Secondary | ICD-10-CM

## 2023-03-10 DIAGNOSIS — I10 Essential (primary) hypertension: Secondary | ICD-10-CM | POA: Insufficient documentation

## 2023-03-10 DIAGNOSIS — R0902 Hypoxemia: Secondary | ICD-10-CM | POA: Diagnosis present

## 2023-03-10 DIAGNOSIS — M80051A Age-related osteoporosis with current pathological fracture, right femur, initial encounter for fracture: Secondary | ICD-10-CM | POA: Diagnosis not present

## 2023-03-10 DIAGNOSIS — Z79899 Other long term (current) drug therapy: Secondary | ICD-10-CM

## 2023-03-10 DIAGNOSIS — S72114A Nondisplaced fracture of greater trochanter of right femur, initial encounter for closed fracture: Secondary | ICD-10-CM | POA: Diagnosis not present

## 2023-03-10 DIAGNOSIS — S72144A Nondisplaced intertrochanteric fracture of right femur, initial encounter for closed fracture: Secondary | ICD-10-CM | POA: Diagnosis not present

## 2023-03-10 DIAGNOSIS — E039 Hypothyroidism, unspecified: Secondary | ICD-10-CM | POA: Diagnosis present

## 2023-03-10 DIAGNOSIS — R296 Repeated falls: Secondary | ICD-10-CM | POA: Diagnosis present

## 2023-03-10 DIAGNOSIS — M160 Bilateral primary osteoarthritis of hip: Secondary | ICD-10-CM | POA: Diagnosis present

## 2023-03-10 DIAGNOSIS — W010XXA Fall on same level from slipping, tripping and stumbling without subsequent striking against object, initial encounter: Secondary | ICD-10-CM | POA: Diagnosis present

## 2023-03-10 DIAGNOSIS — I110 Hypertensive heart disease with heart failure: Secondary | ICD-10-CM | POA: Diagnosis present

## 2023-03-10 DIAGNOSIS — Z880 Allergy status to penicillin: Secondary | ICD-10-CM

## 2023-03-10 DIAGNOSIS — J449 Chronic obstructive pulmonary disease, unspecified: Secondary | ICD-10-CM | POA: Diagnosis present

## 2023-03-10 DIAGNOSIS — E8809 Other disorders of plasma-protein metabolism, not elsewhere classified: Secondary | ICD-10-CM | POA: Diagnosis present

## 2023-03-10 DIAGNOSIS — S72116A Nondisplaced fracture of greater trochanter of unspecified femur, initial encounter for closed fracture: Secondary | ICD-10-CM | POA: Diagnosis present

## 2023-03-10 DIAGNOSIS — Z885 Allergy status to narcotic agent status: Secondary | ICD-10-CM

## 2023-03-10 DIAGNOSIS — J479 Bronchiectasis, uncomplicated: Secondary | ICD-10-CM | POA: Diagnosis present

## 2023-03-10 DIAGNOSIS — W1830XA Fall on same level, unspecified, initial encounter: Secondary | ICD-10-CM

## 2023-03-10 DIAGNOSIS — Y9301 Activity, walking, marching and hiking: Secondary | ICD-10-CM | POA: Diagnosis present

## 2023-03-10 DIAGNOSIS — Z902 Acquired absence of lung [part of]: Secondary | ICD-10-CM

## 2023-03-10 DIAGNOSIS — J9811 Atelectasis: Secondary | ICD-10-CM | POA: Diagnosis present

## 2023-03-10 DIAGNOSIS — I4510 Unspecified right bundle-branch block: Secondary | ICD-10-CM | POA: Diagnosis present

## 2023-03-10 DIAGNOSIS — D72829 Elevated white blood cell count, unspecified: Secondary | ICD-10-CM | POA: Diagnosis present

## 2023-03-10 DIAGNOSIS — Z79890 Hormone replacement therapy (postmenopausal): Secondary | ICD-10-CM

## 2023-03-10 DIAGNOSIS — Z7901 Long term (current) use of anticoagulants: Secondary | ICD-10-CM

## 2023-03-10 DIAGNOSIS — Z9181 History of falling: Secondary | ICD-10-CM

## 2023-03-10 DIAGNOSIS — Z7951 Long term (current) use of inhaled steroids: Secondary | ICD-10-CM

## 2023-03-10 DIAGNOSIS — D509 Iron deficiency anemia, unspecified: Secondary | ICD-10-CM | POA: Diagnosis present

## 2023-03-10 DIAGNOSIS — I5032 Chronic diastolic (congestive) heart failure: Secondary | ICD-10-CM | POA: Diagnosis not present

## 2023-03-10 DIAGNOSIS — Z86711 Personal history of pulmonary embolism: Secondary | ICD-10-CM

## 2023-03-10 HISTORY — DX: Chronic diastolic (congestive) heart failure: I50.32

## 2023-03-10 LAB — COMPREHENSIVE METABOLIC PANEL
ALT: 16 U/L (ref 0–44)
AST: 17 U/L (ref 15–41)
Albumin: 2.9 g/dL — ABNORMAL LOW (ref 3.5–5.0)
Alkaline Phosphatase: 101 U/L (ref 38–126)
Anion gap: 8 (ref 5–15)
BUN: 29 mg/dL — ABNORMAL HIGH (ref 8–23)
CO2: 24 mmol/L (ref 22–32)
Calcium: 9 mg/dL (ref 8.9–10.3)
Chloride: 104 mmol/L (ref 98–111)
Creatinine, Ser: 0.95 mg/dL (ref 0.44–1.00)
GFR, Estimated: 58 mL/min — ABNORMAL LOW (ref >60)
Glucose, Bld: 96 mg/dL (ref 70–99)
Potassium: 3.6 mmol/L (ref 3.5–5.1)
Sodium: 136 mmol/L (ref 135–145)
Total Bilirubin: 0.6 mg/dL (ref 0.3–1.2)
Total Protein: 6.2 g/dL — ABNORMAL LOW (ref 6.5–8.1)

## 2023-03-10 LAB — CBC
HCT: 25.6 % — ABNORMAL LOW (ref 36.0–46.0)
Hemoglobin: 8.3 g/dL — ABNORMAL LOW (ref 12.0–15.0)
MCH: 28 pg (ref 26.0–34.0)
MCHC: 32.4 g/dL (ref 30.0–36.0)
MCV: 86.5 fL (ref 80.0–100.0)
Platelets: 285 10*3/uL (ref 150–400)
RBC: 2.96 MIL/uL — ABNORMAL LOW (ref 3.87–5.11)
RDW: 13.9 % (ref 11.5–15.5)
WBC: 11.6 10*3/uL — ABNORMAL HIGH (ref 4.0–10.5)
nRBC: 0 % (ref 0.0–0.2)

## 2023-03-10 MED ORDER — ALBUTEROL SULFATE (2.5 MG/3ML) 0.083% IN NEBU: 3.0000 mL | INHALATION_SOLUTION | RESPIRATORY_TRACT | Status: DC | PRN

## 2023-03-10 MED ORDER — OXYCODONE HCL 5 MG PO TABS: 5.0000 mg | ORAL_TABLET | ORAL | Status: DC | PRN

## 2023-03-10 MED ORDER — SENNOSIDES-DOCUSATE SODIUM 8.6-50 MG PO TABS
1.0000 | ORAL_TABLET | Freq: Every day | ORAL | Status: DC
  Administered 2023-03-10: 1 via ORAL
  Filled 2023-03-10 (×2): qty 1

## 2023-03-10 MED ORDER — UMECLIDINIUM BROMIDE 62.5 MCG/ACT IN AEPB
1.0000 | INHALATION_SPRAY | Freq: Every day | RESPIRATORY_TRACT | Status: DC
  Administered 2023-03-11 – 2023-03-12 (×2): 1 via RESPIRATORY_TRACT
  Filled 2023-03-10: qty 7

## 2023-03-10 MED ORDER — POLYETHYLENE GLYCOL 3350 17 G PO PACK
17.0000 g | PACK | Freq: Every day | ORAL | Status: DC
  Administered 2023-03-11: 17 g via ORAL
  Filled 2023-03-10 (×2): qty 1

## 2023-03-10 MED ORDER — LISINOPRIL 10 MG PO TABS
10.0000 mg | ORAL_TABLET | Freq: Every day | ORAL | Status: DC
  Administered 2023-03-10 – 2023-03-12 (×3): 10 mg via ORAL
  Filled 2023-03-10 (×3): qty 1

## 2023-03-10 MED ORDER — HEPARIN SODIUM (PORCINE) 5000 UNIT/ML IJ SOLN
5000.0000 [IU] | Freq: Three times a day (TID) | INTRAMUSCULAR | Status: DC
  Administered 2023-03-10 – 2023-03-12 (×4): 5000 [IU] via SUBCUTANEOUS
  Filled 2023-03-10 (×7): qty 1

## 2023-03-10 MED ORDER — MOMETASONE FURO-FORMOTEROL FUM 200-5 MCG/ACT IN AERO
2.0000 | INHALATION_SPRAY | Freq: Two times a day (BID) | RESPIRATORY_TRACT | Status: DC
  Administered 2023-03-11 – 2023-03-12 (×2): 2 via RESPIRATORY_TRACT
  Filled 2023-03-10: qty 8.8

## 2023-03-10 MED ORDER — ACETAMINOPHEN 500 MG PO TABS
1000.0000 mg | ORAL_TABLET | Freq: Three times a day (TID) | ORAL | Status: DC
  Administered 2023-03-10 – 2023-03-12 (×6): 1000 mg via ORAL
  Filled 2023-03-10 (×7): qty 2

## 2023-03-10 MED ORDER — LEVOTHYROXINE SODIUM 75 MCG PO TABS
75.0000 ug | ORAL_TABLET | Freq: Every day | ORAL | Status: DC
  Administered 2023-03-11 – 2023-03-12 (×2): 75 ug via ORAL
  Filled 2023-03-10 (×2): qty 1

## 2023-03-10 MED ORDER — PANTOPRAZOLE SODIUM 40 MG PO TBEC
40.0000 mg | DELAYED_RELEASE_TABLET | Freq: Every day | ORAL | Status: DC
  Administered 2023-03-10 – 2023-03-12 (×3): 40 mg via ORAL
  Filled 2023-03-10 (×3): qty 1

## 2023-03-10 NOTE — Consult Note (Signed)
Orthopedic Surgery H&P Note  Assessment: Patient is a 88 y.o. female with right greater trochanteric femur fracture that extends into the intertrochanteric region seen on MRI   Plan: -Recommended operative stabilization with the patient and her daughter due to the extension into the intertrochanteric region.  Went over the risks, benefits, alternatives of operative stabilization.  Explained that her other alternatives would be to weight-bear as tolerated or remain bedrest because she would be unable to partial or nonweightbearing on the extremity at her age.  I discussed the rationale for operative stabilization to allow for early mobilization and prevent the fracture from displacing.  After our discussion, the patient was clear and had capacity to tell me that she did not want operative intervention at this time.  She wants to try to mobilize with PT.  I told her that I will continue to round on her and if she changes her mind, then I would perform operative stabilization.  Patient's daughter was witness to this conversation.  Changed her diet to regular and made her weightbearing as tolerated.  I will make her n.p.o. after breakfast tomorrow in case she elects to proceed with operative stabilization at which point I would be able to do it tomorrow evening. -Dispo: pending mobilization with PT  ___________________________________________________________________________   Reason for consult: Right hip fracture  History:  Patient is a 88 y.o. female who had a ground-level fall on 03/09/2023 while at her independent living facility.  She was unable to get up or ambulate.  She was brought to [facility] emergency department.  She was complaining of right hip pain so the emergency department obtained a CT scan.  A greater trochanter fracture was seen so MRI was then obtained.  Orthopedics was consulted for the fracture.  Patient was seen after admission to the medicine service on the floor.  She is  complaining of right hip pain.  She states she has not been able to weight-bear since the fall.  She is not having pain anywhere else in the lower extremities and she is not having any pain in the upper extremities.  She denies numbness and paresthesias.  Her only other complaint is a cough that she has.   Physical Exam:  General: no acute distress, appears stated age Neurologic: alert, answering questions appropriately, following commands Cardiovascular: regular rate, no cyanosis Respiratory: unlabored breathing on room air, symmetric chest rise Psychiatric: appropriate affect, normal cadence to speech  MSK:   -Bilateral upper extremities  No tenderness to palpation over extremity, no gross deformity, no open wounds Fires deltoid, biceps, triceps, wrist extensors, wrist flexors, finger extensors, finger flexors  AIN/PIN/IO intact  Palpable radial pulse  Sensation intact to light touch in median/ulnar/radial/axillary nerve distributions  Hand warm and well perfused  -Left lower extremity  No tenderness to palpation over extremity, negative logroll, no gross deformity, no open wounds Fires hip flexors, quadriceps, hamstrings, tibialis anterior, gastrocnemius and soleus, extensor hallucis longus Plantarflexes and dorsiflexes toes Sensation intact to light touch in sural, saphenous, tibial, deep peroneal, and superficial peroneal nerve distributions Foot warm and well perfused  -Right lower extremity  TTP over the hip, no other tenderness palpation over the remainder of the extremity, pain with logroll, no gross deformity, no open wounds  Was able to straight leg raise against gravity Fires hip flexors, quadriceps, hamstrings, tibialis anterior, gastrocnemius and soleus, extensor hallucis longus Plantarflexes and dorsiflexes toes Sensation intact to light touch in sural, saphenous, tibial, deep peroneal, and superficial peroneal nerve distributions  Foot warm and well  perfused  Imaging: CT of the right hip from 03/10/2023 was independently reviewed and interpreted, showing a nondisplaced greater trochanter fracture.  No other fracture seen.  No dislocation seen.  MRI of the right hip from 03/10/2023 was independently reviewed and interpreted, showing a greater trochanter fracture with extension into the intertrochanteric region.  There is T2 signal enhancement across the intertrochanteric region seen both on the coronal and axial images.  On the T1, a dark line can be seen extending past the greater trochanter into the intertrochanteric region.   Patient name: Nicole [name] Patient MRN: [id number] Date: 03/10/23

## 2023-03-10 NOTE — Consult Note (Signed)
Reason for Consult:Right hip fx Referring Physician: Gloris Manchester Time called: 1610 Time at bedside: 0950   Nicole [name] is an 88 y.o. female.  HPI: Nicole [name]. Nicole had immediate right hip pain and could not get up. Nicole lives at an independent living facilty and was helped up. When her hip was still hurting that night Nicole came to the ED for evaluation. Workup showed a greater troch fx and orthopedic surgery was consulted.  No past medical history on file.  No past surgical history on file.  No family history on file.  Social History:  reports that Nicole has never smoked. Nicole has never used smokeless tobacco. No history on file for alcohol use and drug use.  Allergies:  Allergies  Allergen Reactions   Codeine Nausea Only and Other (See Comments)    Dizziness    Penicillins Other (See Comments)    Unknown    Medications: I have reviewed the patient's current medications.  Results for orders placed or performed during the hospital encounter of 03/10/23 (from the past 48 hour(s))  CBC     Status: Abnormal   Collection Time: 03/10/23  6:40 AM  Result Value Ref Range   WBC 11.6 (H) 4.0 - 10.5 K/uL   RBC 2.96 (L) 3.87 - 5.11 MIL/uL   Hemoglobin 8.3 (L) 12.0 - 15.0 g/dL   HCT 96.0 (L) 45.4 - 09.8 %   MCV 86.5 80.0 - 100.0 fL   MCH 28.0 26.0 - 34.0 pg   MCHC 32.4 30.0 - 36.0 g/dL   RDW 11.9 14.7 - 82.9 %   Platelets 285 150 - 400 K/uL   nRBC 0.0 0.0 - 0.2 %    Comment: Performed at [facility], [address] [place], [place] [address]  Comprehensive metabolic panel     Status: Abnormal   Collection Time: 03/10/23  6:40 AM  Result Value Ref Range   Sodium 136 135 - 145 mmol/L   Potassium 3.6 3.5 - 5.1 mmol/L   Chloride 104 98 - 111 mmol/L   CO2 24 22 - 32 mmol/L   Glucose, Bld 96 70 - 99 mg/dL    Comment: Glucose reference range applies only to samples taken after fasting for at least 8 hours.   BUN 29 (H) 8 - 23 mg/dL   Creatinine, Ser 0.86 0.44 -  1.00 mg/dL   Calcium 9.0 8.9 - 57.8 mg/dL   Total Protein 6.2 (L) 6.5 - 8.1 g/dL   Albumin 2.9 (L) 3.5 - 5.0 g/dL   AST 17 15 - 41 U/L   ALT 16 0 - 44 U/L   Alkaline Phosphatase 101 38 - 126 U/L   Total Bilirubin 0.6 0.3 - 1.2 mg/dL   GFR, Estimated 58 (L) >60 mL/min    Comment: (NOTE) Calculated using the CKD-EPI Creatinine Equation (2021)    Anion gap 8 5 - 15    Comment: Performed at [facility], [address], [place], [place] [address]    MR HIP RIGHT WO CONTRAST  Result Date: 03/10/2023 CLINICAL DATA:  Fracture, hip EXAM: MR OF THE RIGHT HIP WITHOUT CONTRAST TECHNIQUE: Multiplanar, multisequence MR imaging was performed. No intravenous contrast was administered. COMPARISON:  None Available. FINDINGS: Bones: There is a nondisplaced fracture extending vertically from the greater trochanter into the intertrochanteric zone (coronal T1 images 13-16). There is associated marrow edema. No avascular necrosis. Marrow edema and focal low T1 signal in the right anterior superior  iliac bone, with nonaggressive appearing lesion as a correlate on recent CT. Mild-to-moderate degenerative changes of the SI joints and pubic symphysis. Small benign bone island in the left acetabulum. Multilevel degenerative changes of the lumbar spine, partially visualized. Articular cartilage and labrum Articular cartilage:  Moderate chondrosis bilaterally. Labrum: Degenerative superior labral tearing most prominent anteriorly. Probable left-sided labral tear as well with adjacent paralabral cyst which measures 2.1 x 1.5 x 2.0 cm Joint or bursal effusion Joint effusion: No significant hip joint effusion. Bursae: No evidence of trochanteric bursitis. Muscles and tendons Muscles and tendons: High-grade tear of the right distal gluteus minimus tendon with retraction. Partial tear of the distal gluteus medius tendon. Extensive associated right gluteus medius edema. Severe right gluteus minimus atrophy. Moderate-severe  left gluteus minimus and medius atrophy with chronic high-grade distal left gluteal tendon tears. High-grade partial tear of the left proximal hamstrings. Low-grade partial tear of the right proximal hamstrings.Reactive intramuscular edema in the hip adductors, right greater than left. Delete Other findings Miscellaneous: Distended urinary bladder. Sigmoid diverticulosis. Prior hysterectomy. IMPRESSION: Nondisplaced fracture of the right greater trochanter extending into the intertrochanteric zone with associated marrow edema. High-grade tear of the right distal gluteus minimus tendon with retraction. Partial tear of the distal gluteus medius tendon. Extensive reactive edema in the right gluteus medius muscle. Severe right gluteus minimus atrophy. High-grade partial tear of the left proximal hamstrings. Low-grade partial tear of the right proximal hamstrings. Chronic high-grade tears of the distal left gluteus minimus and medius tendons. Moderate bilateral hip osteoarthritis. Degenerative superior labral tearing most prominent anteriorly. Probable left-sided labral tear as well with adjacent paralabral cyst which measures 2.1 x 1.5 x 2.0 cm. Heterogeneous bone lesion in the right anterior iliac bone, with non-aggressive appearing lesion as a correlate on recent CT. In the absence of known malignancy this is likely benign. Could consider follow-up pelvis CT in 6 months. Distended urinary bladder, correlate for urinary retention. Electronically Signed   By: Caprice Renshaw M.D.   On: 03/10/2023 08:59   CT Hip Right Wo Contrast  Result Date: 03/10/2023 CLINICAL DATA:  Fall with right hip pain, fracture suspected. EXAM: CT OF THE RIGHT HIP WITHOUT CONTRAST TECHNIQUE: Multidetector CT imaging of the right hip was performed according to the standard protocol. Multiplanar CT image reconstructions were also generated. RADIATION DOSE REDUCTION: This exam was performed according to the departmental dose-optimization program  which includes automated exposure control, adjustment of the mA and/or kV according to patient size and/or use of iterative reconstruction technique. COMPARISON:  03/10/2023. FINDINGS: Bones/Joint/Cartilage A lucency is noted in the anterior aspect of the greater trochanter, possible nondisplaced fracture, best seen on sagittal image 26. The remaining bony structures are intact and no dislocation is seen. Mild-to-moderate degenerative changes are noted at the right hip. Ligaments Suboptimally assessed by CT. Muscles and Tendons Within normal limits. Soft tissues Vascular calcifications present in the soft tissues. Mild subcutaneous fat stranding is noted over the lateral aspect of the right hip, possible contusion. IMPRESSION: 1. Lucency involving the anterior aspect of the greater trochanter, possible nondisplaced fracture versus degenerative changes. Correlation with physical exam for point tenderness is suggested. 2. Mild-to-moderate degenerative changes at the right hip Electronically Signed   By: Thornell Sartorius M.D.   On: 03/10/2023 04:23   CT ABDOMEN PELVIS WO CONTRAST  Result Date: 03/10/2023 CLINICAL DATA:  Abdominal trauma, blunt.  Fall.  Right hip pain. EXAM: CT ABDOMEN AND PELVIS WITHOUT CONTRAST TECHNIQUE: Multidetector CT imaging of the abdomen and  pelvis was performed following the standard protocol without IV contrast. RADIATION DOSE REDUCTION: This exam was performed according to the departmental dose-optimization program which includes automated exposure control, adjustment of the mA and/or kV according to patient size and/or use of iterative reconstruction technique. COMPARISON:  10/02/2022. FINDINGS: Lower chest: The heart is enlarged and coronary artery calcifications are noted. There is a small pericardial effusion. Elevation of the left diaphragm is noted. There is bronchial wall thickening with mucous plugging and patchy airspace disease in the right lower lobe. A 4 mm nodule is noted in  the right middle lobe, axial image 12. Hepatobiliary: An ill-defined hypodense lesion and calcifications are present in the left lobe of the liver, hepatic segment 2 measuring 2.2 x 1.6 cm, not significantly changed from the prior exam. No biliary ductal dilatation. A stone is present within the gallbladder. Pancreas: Unremarkable. No pancreatic ductal dilatation or surrounding inflammatory changes. Spleen: The visualized portion of the spleen is within normal limits. The spleen is only partially seen in the field of view. Adrenals/Urinary Tract: The adrenal glands are within normal limits. No renal calculus or hydronephrosis. The bladder is unremarkable. Stomach/Bowel: A small hiatal hernia is present. Stomach is within normal limits. Appendix is not seen. No evidence of bowel wall thickening, distention, or inflammatory changes. No free air or pneumatosis. A moderate amount of retained stool is present in the colon. Scattered diverticula are noted along the sigmoid colon without evidence of diverticulitis. Vascular/Lymphatic: Aortic atherosclerosis. No enlarged abdominal or pelvic lymph nodes. Reproductive: Status post hysterectomy. No adnexal masses. Other: No abdominopelvic ascites. Musculoskeletal: Degenerative changes are present in the thoracolumbar spine and bilateral hips. No acute fracture is seen. IMPRESSION: 1. No acute fracture. 2. Bronchiectasis with mucous plugging and patchy airspace disease in the right lower lobe, suspicious for pneumonia. 3. 4 mm right lower lobe pulmonary nodule. No follow-up needed if patient is low-risk.This recommendation follows the consensus statement: Guidelines for Management of Incidental Pulmonary Nodules Detected on CT Images: From the Fleischner Society 2017; Radiology 2017; 284:228-243. 4. Ill-defined hypodense lesion in the liver with calcifications, unchanged from 2023. Multiphase CT may be beneficial for further evaluation on follow-up. 5. Cholelithiasis. 6.  Moderate amount of retained stool in the colon suggesting constipation. 7. Aortic atherosclerosis. Electronically Signed   By: Thornell Sartorius M.D.   On: 03/10/2023 04:12   CT HEAD WO CONTRAST ( )  Result Date: 03/10/2023 CLINICAL DATA:  Moderate to severe head trauma EXAM: CT HEAD WITHOUT CONTRAST TECHNIQUE: Contiguous axial images were obtained from the base of the skull through the vertex without intravenous contrast. RADIATION DOSE REDUCTION: This exam was performed according to the departmental dose-optimization program which includes automated exposure control, adjustment of the mA and/or kV according to patient size and/or use of iterative reconstruction technique. COMPARISON:  09/29/2022 FINDINGS: Brain: No evidence of acute infarction, hemorrhage, hydrocephalus, extra-axial collection or mass lesion/mass effect. Extensive patchy low-density in the cerebral white matter, chronic and attributed to chronic small vessel ischemia. Mild for age cerebral volume loss. Vascular: No hyperdense vessel or unexpected calcification. Skull: Normal. Negative for fracture or focal lesion. Sinuses/Orbits: No acute finding. IMPRESSION: No evidence of intracranial injury. Electronically Signed   By: Tiburcio Pea M.D.   On: 03/10/2023 04:11   DG Hip Unilat W or Wo Pelvis 2-3 Views Right  Result Date: 03/10/2023 CLINICAL DATA:  Fall and trauma to the right hip. EXAM: DG HIP (WITH OR WITHOUT PELVIS) 2-3V RIGHT COMPARISON:  None Available. FINDINGS: There is  no acute fracture or dislocation. The bones are osteopenic. Mild bilateral hip arthritic changes. The soft tissues are unremarkable IMPRESSION: No acute fracture or dislocation. Electronically Signed   By: Elgie Collard M.D.   On: 03/10/2023 03:10    Review of Systems  HENT:  Negative for ear discharge, ear pain, hearing loss and tinnitus.   Eyes:  Negative for photophobia and pain.  Respiratory:  Negative for cough and shortness of breath.    Cardiovascular:  Negative for chest pain.  Gastrointestinal:  Negative for abdominal pain, nausea and vomiting.  Genitourinary:  Negative for dysuria, flank pain, frequency and urgency.  Musculoskeletal:  Positive for arthralgias (Right hip). Negative for back pain, myalgias and neck pain.  Neurological:  Negative for dizziness and headaches.  Hematological:  Does not bruise/bleed easily.  Psychiatric/Behavioral:  The patient is not nervous/anxious.    Blood pressure (!) 149/77, pulse 76, temperature 97.8 F (36.6 C), temperature source Oral, resp. rate 18, SpO2 94 %. Physical Exam Constitutional:      General: Nicole is not in acute distress.    Appearance: Nicole is well-developed. Nicole is not diaphoretic.  HENT:     Head: Normocephalic and atraumatic.  Eyes:     General: No scleral icterus.       Right eye: No discharge.        Left eye: No discharge.     Conjunctiva/sclera: Conjunctivae normal.  Cardiovascular:     Rate and Rhythm: Normal rate and regular rhythm.  Pulmonary:     Effort: Pulmonary effort is normal. No respiratory distress.  Musculoskeletal:     Cervical back: Normal range of motion.     Comments: RLE No traumatic wounds, ecchymosis, or rash  Mild TTP hip  No knee or ankle effusion  Knee stable to varus/ valgus and anterior/posterior stress  Sens DPN, SPN, TN intact  Motor EHL, ext, flex, evers 5/5  DP 2+, PT 2+, No significant edema  Skin:    General: Skin is warm and dry.  Neurological:     Mental Status: Nicole is alert.  Psychiatric:        Mood and Affect: Mood normal.        Behavior: Behavior normal.    Assessment/Plan: Right greater troch fx -- Plan IMN with Dr. Christell Constant.     Freeman Caldron, PA-C Orthopedic Surgery [phone] 03/10/2023, 10:07 AM

## 2023-03-10 NOTE — ED Notes (Signed)
Pt returned from MRI °

## 2023-03-10 NOTE — ED Notes (Signed)
Pt currently refusing in/out catheterization.

## 2023-03-10 NOTE — ED Provider Notes (Signed)
[facility]PT [facility] Emergency Department Provider Note MRN:  [id number]  Arrival date & time: 03/10/23     Chief Complaint   Fall ( Right Hip Pain )    History of Present Illness   Nicole [name] is a 88 y.o. year-old female with no pertinent past medical history presenting to the ED with chief complaint of fall.  Lost her balance and fell yesterday afternoon.  This evening could not get out of bed due to pain in the right hip.  Review of Systems  A thorough review of systems was obtained and all systems are negative except as noted in the HPI and PMH.   Patient's Health History   No past medical history on file.  No past surgical history on file.  No family history on file.  Social History   Socioeconomic History   Marital status: Married    Spouse name: Not on file   Number of children: Not on file   Years of education: Not on file   Highest education level: Not on file  Occupational History   Not on file  Tobacco Use   Smoking status: Never   Smokeless tobacco: Never  Substance and Sexual Activity   Alcohol use: Not on file   Drug use: Not on file   Sexual activity: Not on file  Other Topics Concern   Not on file  Social History Narrative   Not on file   Social Determinants of Health   Financial Resource Strain: Not on file  Food Insecurity: No Food Insecurity (10/28/2022)   Hunger Vital Sign    Worried About Running Out of Food in the Last Year: Never true    Ran Out of Food in the Last Year: Never true  Transportation Needs: No Transportation Needs (10/28/2022)   PRAPARE - Administrator, Civil Service (Medical): No    Lack of Transportation (Non-Medical): No  Physical Activity: Not on file  Stress: Not on file  Social Connections: Not on file  Intimate Partner Violence: Not At Risk (10/28/2022)   Humiliation, Afraid, Rape, and Kick questionnaire    Fear of Current or Ex-Partner: No    Emotionally Abused: No    Physically Abused:  No    Sexually Abused: No     Physical Exam   Vitals:   03/10/23 0515 03/10/23 0541  BP: 118/65   Pulse: 72   Resp: (!) 25   Temp:  98 F (36.7 C)  SpO2: 94%     CONSTITUTIONAL: Well-appearing, NAD NEURO/PSYCH:  Alert and oriented x 3, no focal deficits EYES:  eyes equal and reactive ENT/NECK:  no LAD, no JVD CARDIO: Regular rate, well-perfused, normal S1 and S2 PULM:  CTAB no wheezing or rhonchi GI/GU:  non-distended, non-tender MSK/SPINE:  No gross deformities, no edema, reduced motion of the right hip due to pain SKIN:  no rash, atraumatic   *Additional and/or pertinent findings included in MDM below  Diagnostic and Interventional Summary    EKG Interpretation  Date/Time:    Ventricular Rate:    PR Interval:    QRS Duration:   QT Interval:    QTC Calculation:   R Axis:     Text Interpretation:         Labs Reviewed  CBC  COMPREHENSIVE METABOLIC PANEL    CT HEAD WO CONTRAST ( )  Final Result    CT Hip Right Wo Contrast  Final Result    CT ABDOMEN PELVIS WO CONTRAST  Final Result    DG Hip Unilat W or Wo Pelvis 2-3 Views Right  Final Result    MR HIP RIGHT WO CONTRAST    (Results Pending)    Medications - No data to display   Procedures  /  Critical Care Procedures  ED Course and Medical Decision Making  Initial Impression and Ddx Suspect possible fracture.  Limb is neurovascularly intact, no signs of head trauma.  Awaiting x-rays.  Past medical/surgical history that increases complexity of ED encounter: None  Interpretation of Diagnostics I personally reviewed the pelvis x-ray and my interpretation is as follows: No obvious fracture  CT hip is equivocal for fracture  Patient Reassessment and Ultimate Disposition/Management     Patient ambulated and has quite a bit of discomfort.  Will obtain MRI for more definitive answer with regard to hip fracture.  Signed out to oncoming provider.  Patient management required discussion with  the following services or consulting groups:  None  Complexity of Problems Addressed Acute illness or injury that poses threat of life of bodily function  Additional Data Reviewed and Analyzed Further history obtained from: None  Additional Factors Impacting ED Encounter Risk Consideration of hospitalization  Elmer Sow. Pilar Plate, MD [facility] Emergency Medicine [facility] [email or web address]  Final Clinical Impressions(s) / ED Diagnoses     ICD-10-CM   1. Pain of right hip  M25.551       ED Discharge Orders     None        Discharge Instructions Discussed with and Provided to Patient:   Discharge Instructions   None      Sabas Sous, MD 03/10/23 [phone]

## 2023-03-10 NOTE — ED Notes (Signed)
Patient transported to MRI 

## 2023-03-10 NOTE — ED Notes (Signed)
Patient transported to CT scan . 

## 2023-03-10 NOTE — ED Triage Notes (Signed)
Patient arrived with EMS from [facility] , lost her balance and fell yesterday afternoon 5 pm , no LOC , reports pain at right hip . Respirations unlabored. She takes Eliquis .

## 2023-03-10 NOTE — ED Notes (Signed)
Dr. Marijo Conception at bedside

## 2023-03-10 NOTE — ED Provider Notes (Signed)
Care of patient assumed from Dr. Pilar Plate.  This patient presented for right hip pain following a ground-level mechanical fall yesterday.  X-ray was negative but CT did show some lucency concerning for right hip fracture.  MRI is ordered. Physical Exam  BP 133/60   Pulse 81   Temp 98 F (36.7 C)   Resp 19   SpO2 94%   Physical Exam Vitals and nursing note reviewed.  Constitutional:      General: She is not in acute distress.    Appearance: Normal appearance. She is well-developed. She is not ill-appearing, toxic-appearing or diaphoretic.  HENT:     Head: Normocephalic and atraumatic.     Right Ear: External ear normal.     Left Ear: External ear normal.     Nose: Nose normal.     Mouth/Throat:     Mouth: Mucous membranes are moist.  Eyes:     Extraocular Movements: Extraocular movements intact.     Conjunctiva/sclera: Conjunctivae normal.  Cardiovascular:     Rate and Rhythm: Normal rate and regular rhythm.  Pulmonary:     Effort: Pulmonary effort is normal. No respiratory distress.  Abdominal:     General: There is no distension.     Palpations: Abdomen is soft.     Tenderness: There is no abdominal tenderness.  Musculoskeletal:        General: No swelling.     Cervical back: Normal range of motion and neck supple.     Comments: ROM limited by pain  Skin:    General: Skin is warm and dry.     Coloration: Skin is not jaundiced or pale.  Neurological:     General: No focal deficit present.     Mental Status: She is alert and oriented to person, place, and time.  Psychiatric:        Mood and Affect: Mood normal.        Behavior: Behavior normal.     Procedures  Procedures  ED Course / MDM    Medical Decision Making Amount and/or Complexity of Data Reviewed Labs: ordered. Radiology: ordered.   On assessment, patient resting on the stretcher.  Patient described as a posterior right hip pain that is worsened with range of motion.  At rest, pain is controlled.  CT of  hip shows lucency involving anterior aspect of greater trochanter concerning for nondisplaced fracture.  Given this finding in addition to her symptoms, I spoke with orthopedic surgery, who will see the patient in consult.  MRI confirms right hip fracture.  Patient was admitted to medicine for further management.       Gloris Manchester, MD 03/10/23 [phone]

## 2023-03-10 NOTE — H&P (Addendum)
NAME:  Nicole [name], MRN:  [id number], DOB:  10-Aug-1935, LOS: 0 ADMISSION DATE:  03/10/2023, Primary: Mercy Moore, MD  CHIEF COMPLAINT:  fall   Medical Service: Internal Medicine Teaching Service         Attending Physician: Dr. Gust Rung, DO    First Contact: Dr. Marjie Skiff Pager: [phone]  Second Contact: Dr. Sloan Leiter Pager: [phone]       After Hours (After 5p/  First Contact Pager: [phone]  weekends / holidays): Second Contact Pager: [phone]   HISTORY OF PRESENT ILLNESS   Nicole [name] is [phone] person with chronic diastolic heart failure, chronic bronchiectasis with previous LLL lobectomy, hypothyroidism, hypertension, hyperlipidemia, history of pulmonary embolism presenting from independent living facility after a fall. Nicole [name] states Nicole [name] in her normal state of health until yesterday evening, when Nicole [name] was walking with a plate, turned right, tripped, and fell on her right side. Nicole [name] denies any preceding events, including dizziness or lightheadedness and denies any loss of consciousness with the fall. Nicole [name] called for help and one of the aides came to help her up. Nicole [name] then went to bed later yesterday evening with minimal pain. Later that night, Nicole [name] got up to go to the bathroom and noticed it was difficult to walk due to the pain, so EMS was subsequently called. Nicole [name] states Nicole [name] has had multiple falls over the last year and sometimes feels "clumsy," but this was not the case this time. Nicole [name] does note a mild productive cough in the evenings over the last 10 days and mild dyspnea on exertion, however Nicole [name] denies any fevers, chills, or use of supplemental oxygen at home. Nicole [name] denies any change in appetite, nausea, vomiting, abdominal pain, dysuria, polyuria, constipation, diarrhea, lower extremity swelling.   PCP: Mercy Moore, MD  ED COURSE   Patient arrived to [facility] afebrile, hemodynamically stable sating well on room air.  Initial lab work notable for mild leukocytosis, chronic anemia, and  chronic hypoalbuminemia. Initial XR and CT imaging of R hip without obvious fracture. MRI of R hip revealed nondisplaced fracture of R greater trochanter, high-grade tear of R distal gluteus minimus tendon, and high-grade partial tear of L proximal hamstrings. Orthopedics consulted and IMTS consulted for admission.   PAST MEDICAL HISTORY   Nicole [name],  has a past medical history of Bronchiectasis (HCC) (07/16/2022), Chronic diastolic heart failure (HCC) (03/47/4259), Chronic obstructive lung disease (HCC) (03/24/2022), History of pulmonary embolus (PE) (11/18/2022), and Hypertension (03/20/2022).   HOME MEDICATIONS   Prior to Admission medications   Medication Sig Start Date End Date Taking? Authorizing Provider  acetaminophen (TYLENOL) 325 MG tablet Take 325 mg by mouth daily as needed for moderate pain or headache.    [provider]  albuterol (VENTOLIN HFA) 108 (90 Base) MCG/ACT inhaler Inhale 2 puffs into the lungs every 4 (four) hours as needed for wheezing or shortness of breath. 08/15/22   [provider]  apixaban (ELIQUIS) 5 MG TABS tablet Take 1 tablet (5 mg total) by mouth 2 (two) times daily. 10/11/22   Dibia, Elgie Collard, MD  atorvastatin (LIPITOR) 20 MG tablet Take 20 mg by mouth daily. 09/13/22   [provider]  fluticasone-salmeterol (ADVAIR) 250-50 MCG/ACT AEPB Inhale 1 puff into the lungs 2 (two) times daily. 07/21/22   [provider]  folic acid (FOLVITE) 1 MG tablet Take 1 tablet (1 mg total) by mouth daily. 10/30/22   Sheikh, Omair Latif, DO  INCRUSE ELLIPTA 62.5 MCG/ACT AEPB Inhale 1 puff into the  lungs daily. 10/08/22   [provider]  latanoprost (XALATAN) 0.005 % ophthalmic solution Place 1 drop into both eyes at bedtime. 07/21/22   [provider]  levocetirizine (XYZAL) 5 MG tablet Take 5 mg by mouth daily as needed for allergies. 08/27/22   [provider]  levofloxacin (LEVAQUIN) 750 MG tablet Take 750 mg by mouth  daily. 10/24/22   [provider]  levothyroxine (SYNTHROID) 75 MCG tablet Take 75 mcg by mouth daily. 09/13/22   [provider]  lisinopril (ZESTRIL) 5 MG tablet Take 1 tablet (5 mg total) by mouth daily. 10/30/22 11/29/22  Marguerita Merles Latif, DO  meclizine (ANTIVERT) 25 MG tablet Take 12.5 mg by mouth daily as needed for dizziness. 09/02/22   [provider]  metoprolol succinate (TOPROL-XL) 50 MG 24 hr tablet Take 50 mg by mouth at bedtime. 09/16/22   [provider]  montelukast (SINGULAIR) 10 MG tablet Take 10 mg by mouth daily. 09/13/22   [provider]  Multiple Vitamin (MULTIVITAMIN WITH MINERALS) TABS tablet Take 1 tablet by mouth daily. 10/30/22   Sheikh, Omair Latif, DO  ondansetron (ZOFRAN) 4 MG tablet Take 1 tablet (4 mg total) by mouth every 6 (six) hours as needed for nausea. 10/30/22   Marguerita Merles Latif, DO  pantoprazole (PROTONIX) 40 MG tablet Take 1 tablet (40 mg total) by mouth 2 (two) times daily. 10/08/22   Dibia, Elgie Collard, MD  polyethylene glycol (MIRALAX / GLYCOLAX) 17 g packet Take 17 g by mouth daily. 10/30/22   Sheikh, Omair Latif, DO  senna-docusate (SENOKOT-S) 8.6-50 MG tablet Take 1 tablet by mouth at bedtime. 10/30/22   Marguerita Merles Latif, DO  verapamil (CALAN-SR) 180 MG CR tablet Take 180 mg by mouth at bedtime. 09/13/22   [provider]    ALLERGIES   Allergies as of 03/10/2023 - Review Complete 03/10/2023  Allergen Reaction Noted   Codeine Nausea Only and Other (See Comments) 06/27/2022   Penicillins Other (See Comments) 06/27/2022    SOCIAL HISTORY   Patient is originally from the [place] where Nicole [name] met her [place] husband while he was overseas in the [employer]. Nicole [name] has been in the [place] for the last 60 years and has lived in [place], [place], and now [place]. Nicole [name] recently moved from her own house to an independent living facility. Nicole [name] reports Nicole [name] can complete her ADL's and IADL's with minimal  to no assistance. Denies history of or current tobacco, alcohol, or drug use.   FAMILY HISTORY   Her family history is not on file.   REVIEW OF SYSTEMS   ROS per history of present illness.  PHYSICAL EXAMINATION   Blood pressure (!) 173/74, pulse 74, temperature 98.5 F (36.9 C), temperature source Oral, resp. rate 18, height 5\' 3"  (1.6 m), SpO2 96 %.    There were no vitals filed for this visit.  GENERAL: Elderly appearing person laying in bed in no acute distress HENT: Normocephalic, atraumatic. Dry mucous membranes noted.  EYES: Vision grossly in tact. Amblyopia present. No scleral icterus.  CV: Regular rate, rhythm. 3/6 systolic murmur heard best at RUSB Warm extremities.  PULM: Normal work of breathing on room air. Bilateral rhonchi noted in bases.  GI: Abdomen soft, non-tender, non-distended. Normoactive bowel sounds.  MSK: Decreased muscle tone throughout. No peripheral edema appreciated. Mild bruising apparent over R hip, mild point tenderness.  SKIN: Warm, dry. No rashes or lesions noted.  NEURO: Awake, alert, conversing appropriately. Grossly non-focal.  PSYCH: Pleasant, normal mood, affect, speech.   SIGNIFICANT DIAGNOSTIC TESTS   ECG: Normal sinus rhythm. Right bundle branch block that appears similar to previous.  I personally reviewed patient's ECG with my interpretation as above.  MRI HIP WO CONTRAST -Nondisplaced fracture of the right greater trochanter extending into the intertrochanteric zone with associated marrow edema. -High-grade tear of the right distal gluteus minimus tendon with retraction. Partial tear of the distal gluteus medius tendon. Extensive reactive edema in the right gluteus medius muscle. Severe right gluteus minimus atrophy. -High-grade partial tear of the left proximal hamstrings. Low-grade partial tear of the right proximal hamstrings. Chronic high-grade tears of the distal left gluteus minimus and medius tendons. -Moderate bilateral hip  osteoarthritis. Degenerative superior labral tearing most prominent anteriorly. Probable left-sided labral tear as well with adjacent paralabral cyst which measures 2.1 x 1.5 x 2.0cm. -Heterogeneous bone lesion in the right anterior iliac bone, with non-aggressive appearing lesion as a correlate on recent CT. In the absence of known malignancy this is likely benign. Could consider follow-up pelvis CT in 6 months. -Distended urinary bladder, correlate for urinary retention.  LABS      Latest Ref Rng & Units 03/10/2023    6:40 AM 10/30/2022   10:12 AM 10/30/2022    4:12 AM  CBC  WBC 4.0 - 10.5 K/uL 11.6  7.9  8.9   Hemoglobin 12.0 - 15.0 g/dL 8.3  8.7  9.3   Hematocrit 36.0 - 46.0 % 25.6  27.3  29.2   Platelets 150 - 400 K/uL 285  272  297       Latest Ref Rng & Units 03/10/2023    6:40 AM 10/30/2022   10:12 AM 10/30/2022    4:12 AM  BMP  Glucose 70 - 99 mg/dL 96  161  99   BUN 8 - 23 mg/dL 29  16  18    Creatinine 0.44 - 1.00 mg/dL 0.96  0.45  4.09   Sodium 135 - 145 mmol/L 136  135  139   Potassium 3.5 - 5.1 mmol/L 3.6  4.1  4.1   Chloride 98 - 111 mmol/L 104  105  109   CO2 22 - 32 mmol/L 24  24  23    Calcium 8.9 - 10.3 mg/dL 9.0  8.9  9.1     CONSULTS   Orthopedic surgery   ASSESSMENT   Braelyn Hinck is 88yo person with chronic diastolic heart failure, chronic bronchiectasis with previous LLL lobectomy, hypothyroidism, hypertension, hyperlipidemia admitted 5/21 with nondisplaced fracture of R greater trochanter.   PLAN   Principal Problem:   Nondisplaced fracture of greater trochanter of right femur (HCC) Active Problems:   Bronchiectasis (HCC)   Chronic diastolic heart failure (HCC)   Hypertension   Closed fracture of right hip (HCC)  #Nondisplaced fracture of R greater trochanter Patient presenting after ground-level mechanical fall. Nicole [name] does not endorse a history of a prodrome proceeding the event and Nicole [name] appears to be neurologically at baseline. Thankfully at this  time Nicole [name] does not appear to be in much acute pain. Appreciate orthopedics' assistance with this case. Nicole [name] likely would benefit from a bisphosphonate moving forward, can start this pending decision on potential surgery.  - Appreciate orthopedics assistance - Tylenol 1000mg  three times daily - Oxycodone 5mg  every 4 hours as needed, holding parameters in place - Bowel regimen in place - Consider addition of bisphosphonate - Follow-up vitamin D level - PT/OT  #Productive cough #History of bronchiectasis s/p LLL lobectomy Patient  reports having productive cough for the last 10 days. Nicole [name] states Nicole [name] is not overtly short of breath, but intermittently has been more short of breath during this time as well. During my examination patient was intermittently hypoxic, mostly sating in low 90's with occasional reading in upper 80's. Nicole [name] denies any systemic symptoms. Given her symptoms and mild leukocytosis (which could be due to her underlying lung disease and fall), I am going to get imaging to further evaluate. If there is a consolidation present, would be in favor of treating for community-acquired pneumonia.  - Follow-up chest x-ray - Continue home inhalers  #Chronic diastolic heart failure #Hypertension Appears euvolemic. I am unsure of what her current home regimen is. Nicole [name] reports taking a "water pill" but I do not see this listed on our medication list or on recent cardiology notes. Nicole [name] was recently started on a thiazide, however would probably avoid this moving forward due to her falls. Would consider d/c metoprolol and verapamil at discharge given age and fall risk too. Will continue with lisinopril for now, could add amlodipine as well.  - Continue lisinopril 10mg  daily - Consider addition of amlodipine - Daily BMP  #History of provoked pulmonary embolism Pulmonary embolism was in December after a few days in the ICU (was not getting prophylactic anticoagulation at the time d/t concern for acute  blood loss anemia). Nicole [name] has been receiving anticoagulation since then, I think it would be reasonable to stop full dose anticoagulation given her history of multiple falls. I appreciate pharmacy's assistance with this. - Subcutaneous heparin for prophylaxis - Would stop anticoagulation for history of PE at discharge  #Chronic normocytic anemia Hgb 8.3<8.7, normal MCV. No signs or symptoms of bleeding. No renal disease, we will follow-up with anemia labs. - Follow-up iron labs, folate, vitamin B12 - Daily CBC  #Hyperlipidemia - Continue home atorvastatin  #Hypothyroidism - Continue home Synthroid  BEST PRACTICE   DIET: NPO IVF: n/a DVT PPX: SQ heparin BOWEL: Senokot-S, miralax CODE: FULL - has DNR sheet, but clarified with patient Nicole [name] would prefer to have full resuscitation at this time. Would consider continuing discussing this.  FAM COM: attempted calling family twice  DISPO: Admit patient to Observation with expected length of stay less than 2 midnights.  Evlyn Kanner, MD Internal Medicine Resident PGY-3 Pager [phone] 03/10/23 4:50 PM

## 2023-03-10 NOTE — ED Notes (Signed)
ED TO INPATIENT HANDOFF REPORT  ED Nurse Name and Phone #: Ailene Rud  S Name/Age/Gender Nicole [name] 88 y.o. female Room/Bed: 001C/001C  Code Status   Code Status: Full Code  Home/SNF/Other Skilled nursing facility Patient oriented to: self, place, and time Is this baseline? 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 Respirations unlabored. She takes Eliquis .    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 No past surgical history on file.   A IV Location/Drains/Wounds Patient Lines/Drains/Airways Status     Active Line/Drains/Airways     Name Placement date Placement time Site Days   Peripheral IV 03/10/23 18 G Left Antecubital 03/10/23  0551  Antecubital  less than 1            Intake/Output Last 24 hours No intake or output data in the 24 hours ending 03/10/23 1145  Labs/Imaging Results for orders placed or performed during the hospital  encounter of 03/10/23 (from the past 48 hour(s))  CBC     Status: Abnormal   Collection Time: 03/10/23  6:40 AM  Result Value Ref Range   WBC 11.6 (H) 4.0 - 10.5 K/uL   RBC 2.96 (L) 3.87 - 5.11 MIL/uL   Hemoglobin 8.3 (L) 12.0 - 15.0 g/dL   HCT 04.5 (L) 40.9 - 81.1 %   MCV 86.5 80.0 - 100.0 fL   MCH 28.0 26.0 - 34.0 pg   MCHC 32.4 30.0 - 36.0 g/dL   RDW 91.4 78.2 - 95.6 %   Platelets 285 150 - 400 K/uL   nRBC 0.0 0.0 - 0.2 %    Comment: Performed at [facility], [address], [place], [place] [address]  Comprehensive metabolic panel     Status: Abnormal   Collection Time: 03/10/23  6:40 AM  Result Value Ref Range   Sodium 136 135 - 145 mmol/L   Potassium 3.6 3.5 - 5.1 mmol/L   Chloride 104 98 - 111 mmol/L   CO2 24 22 - 32 mmol/L   Glucose, Bld 96 70 - 99 mg/dL    Comment: Glucose reference range applies only to samples taken after fasting for at least 8 hours.   BUN 29 (H) 8 - 23 mg/dL   Creatinine, Ser 6.57 0.44 - 1.00 mg/dL   Calcium 9.0 8.9 - 84.6 mg/dL   Total Protein 6.2 (L) 6.5 - 8.1 g/dL   Albumin 2.9 (  L) 3.5 - 5.0 g/dL   AST 17 15 - 41 U/L   ALT 16 0 - 44 U/L   Alkaline Phosphatase 101 38 - 126 U/L   Total Bilirubin 0.6 0.3 - 1.2 mg/dL   GFR, Estimated 58 (L) >60 mL/min    Comment: (NOTE) Calculated using the CKD-EPI Creatinine Equation (2021)    Anion gap 8 5 - 15    Comment: Performed at [facility], [address], [place], [place] [address]   MR HIP RIGHT WO CONTRAST  Result Date: 03/10/2023 CLINICAL DATA:  Fracture, hip EXAM: MR OF THE RIGHT HIP WITHOUT CONTRAST TECHNIQUE: Multiplanar, multisequence MR imaging was performed. No intravenous contrast was administered. COMPARISON:  None Available. FINDINGS: Bones: There is a nondisplaced fracture extending vertically from the greater trochanter into the intertrochanteric zone (coronal T1 images 13-16). There is associated marrow edema. No avascular necrosis. Marrow edema and focal low T1 signal in  the right anterior superior iliac bone, with nonaggressive appearing lesion as a correlate on recent CT. Mild-to-moderate degenerative changes of the SI joints and pubic symphysis. Small benign bone island in the left acetabulum. Multilevel degenerative changes of the lumbar spine, partially visualized. Articular cartilage and labrum Articular cartilage:  Moderate chondrosis bilaterally. Labrum: Degenerative superior labral tearing most prominent anteriorly. Probable left-sided labral tear as well with adjacent paralabral cyst which measures 2.1 x 1.5 x 2.0 cm Joint or bursal effusion Joint effusion: No significant hip joint effusion. Bursae: No evidence of trochanteric bursitis. Muscles and tendons Muscles and tendons: High-grade tear of the right distal gluteus minimus tendon with retraction. Partial tear of the distal gluteus medius tendon. Extensive associated right gluteus medius edema. Severe right gluteus minimus atrophy. Moderate-severe left gluteus minimus and medius atrophy with chronic high-grade distal left gluteal tendon tears. High-grade partial tear of the left proximal hamstrings. Low-grade partial tear of the right proximal hamstrings.Reactive intramuscular edema in the hip adductors, right greater than left. Delete Other findings Miscellaneous: Distended urinary bladder. Sigmoid diverticulosis. Prior hysterectomy. IMPRESSION: Nondisplaced fracture of the right greater trochanter extending into the intertrochanteric zone with associated marrow edema. High-grade tear of the right distal gluteus minimus tendon with retraction. Partial tear of the distal gluteus medius tendon. Extensive reactive edema in the right gluteus medius muscle. Severe right gluteus minimus atrophy. High-grade partial tear of the left proximal hamstrings. Low-grade partial tear of the right proximal hamstrings. Chronic high-grade tears of the distal left gluteus minimus and medius tendons. Moderate bilateral hip osteoarthritis.  Degenerative superior labral tearing most prominent anteriorly. Probable left-sided labral tear as well with adjacent paralabral cyst which measures 2.1 x 1.5 x 2.0 cm. Heterogeneous bone lesion in the right anterior iliac bone, with non-aggressive appearing lesion as a correlate on recent CT. In the absence of known malignancy this is likely benign. Could consider follow-up pelvis CT in 6 months. Distended urinary bladder, correlate for urinary retention. Electronically Signed   By: Caprice Renshaw M.D.   On: 03/10/2023 08:59   CT Hip Right Wo Contrast  Result Date: 03/10/2023 CLINICAL DATA:  Fall with right hip pain, fracture suspected. EXAM: CT OF THE RIGHT HIP WITHOUT CONTRAST TECHNIQUE: Multidetector CT imaging of the right hip was performed according to the standard protocol. Multiplanar CT image reconstructions were also generated. RADIATION DOSE REDUCTION: This exam was performed according to the departmental dose-optimization program which includes automated exposure control, adjustment of the mA and/or kV according to patient size and/or use of iterative reconstruction technique. COMPARISON:  03/10/2023. FINDINGS: Bones/Joint/Cartilage A lucency is noted in the anterior aspect of the greater trochanter, possible nondisplaced fracture, best seen on sagittal image 26. The remaining bony structures are intact and no dislocation is seen. Mild-to-moderate degenerative changes are noted at the right hip. Ligaments Suboptimally assessed by CT. Muscles and Tendons Within normal limits. Soft tissues Vascular calcifications present in the soft tissues. Mild subcutaneous fat stranding is noted over the lateral aspect of the right hip, possible contusion. IMPRESSION: 1. Lucency involving the anterior aspect of the greater trochanter, possible nondisplaced fracture versus degenerative changes. Correlation with physical exam for point tenderness is suggested. 2. Mild-to-moderate degenerative changes at the right hip  Electronically Signed   By: Thornell Sartorius M.D.   On: 03/10/2023 04:23   CT ABDOMEN PELVIS WO CONTRAST  Result Date: 03/10/2023 CLINICAL DATA:  Abdominal trauma, blunt.  Fall.  Right hip pain. EXAM: CT ABDOMEN AND PELVIS WITHOUT CONTRAST TECHNIQUE: Multidetector CT imaging of the abdomen and pelvis was performed following the standard protocol without IV contrast. RADIATION DOSE REDUCTION: This exam was performed according to the departmental dose-optimization program which includes automated exposure control, adjustment of the mA and/or kV according to patient size and/or use of iterative reconstruction technique. COMPARISON:  10/02/2022. FINDINGS: Lower chest: The heart is enlarged and coronary artery calcifications are noted. There is a small pericardial effusion. Elevation of the left diaphragm is noted. There is bronchial wall thickening with mucous plugging and patchy airspace disease in the right lower lobe. A 4 mm nodule is noted in the right middle lobe, axial image 12. Hepatobiliary: An ill-defined hypodense lesion and calcifications are present in the left lobe of the liver, hepatic segment 2 measuring 2.2 x 1.6 cm, not significantly changed from the prior exam. No biliary ductal dilatation. A stone is present within the gallbladder. Pancreas: Unremarkable. No pancreatic ductal dilatation or surrounding inflammatory changes. Spleen: The visualized portion of the spleen is within normal limits. The spleen is only partially seen in the field of view. Adrenals/Urinary Tract: The adrenal glands are within normal limits. No renal calculus or hydronephrosis. The bladder is unremarkable. Stomach/Bowel: A small hiatal hernia is present. Stomach is within normal limits. Appendix is not seen. No evidence of bowel wall thickening, distention, or inflammatory changes. No free air or pneumatosis. A moderate amount of retained stool is present in the colon. Scattered diverticula are noted along the sigmoid colon  without evidence of diverticulitis. Vascular/Lymphatic: Aortic atherosclerosis. No enlarged abdominal or pelvic lymph nodes. Reproductive: Status post hysterectomy. No adnexal masses. Other: No abdominopelvic ascites. Musculoskeletal: Degenerative changes are present in the thoracolumbar spine and bilateral hips. No acute fracture is seen. IMPRESSION: 1. No acute fracture. 2. Bronchiectasis with mucous plugging and patchy airspace disease in the right lower lobe, suspicious for pneumonia. 3. 4 mm right lower lobe pulmonary nodule. No follow-up needed if patient is low-risk.This recommendation follows the consensus statement: Guidelines for Management of Incidental Pulmonary Nodules Detected on CT Images: From the Fleischner Society 2017; Radiology 2017; 284:228-243. 4. Ill-defined hypodense lesion in the liver with calcifications, unchanged from 2023. Multiphase CT may be beneficial for further evaluation on follow-up. 5. Cholelithiasis. 6. Moderate amount of retained stool in the colon suggesting constipation. 7. Aortic atherosclerosis. Electronically Signed   By: Thornell Sartorius M.D.   On: 03/10/2023 04:12   CT HEAD WO CONTRAST ( )  Result Date: 03/10/2023 CLINICAL DATA:  Moderate to severe head trauma EXAM: CT HEAD WITHOUT CONTRAST TECHNIQUE: Contiguous axial images were obtained from  the base of the skull through the vertex without intravenous contrast. RADIATION DOSE REDUCTION: This exam was performed according to the departmental dose-optimization program which includes automated exposure control, adjustment of the mA and/or kV according to patient size and/or use of iterative reconstruction technique. COMPARISON:  09/29/2022 FINDINGS: Brain: No evidence of acute infarction, hemorrhage, hydrocephalus, extra-axial collection or mass lesion/mass effect. Extensive patchy low-density in the cerebral white matter, chronic and attributed to chronic small vessel ischemia. Mild for age cerebral volume loss.  Vascular: No hyperdense vessel or unexpected calcification. Skull: Normal. Negative for fracture or focal lesion. Sinuses/Orbits: No acute finding. IMPRESSION: No evidence of intracranial injury. Electronically Signed   By: Tiburcio Pea M.D.   On: 03/10/2023 04:11   DG Hip Unilat W or Wo Pelvis 2-3 Views Right  Result Date: 03/10/2023 CLINICAL DATA:  Fall and trauma to the right hip. EXAM: DG HIP (WITH OR WITHOUT PELVIS) 2-3V RIGHT COMPARISON:  None Available. FINDINGS: There is no acute fracture or dislocation. The bones are osteopenic. Mild bilateral hip arthritic changes. The soft tissues are unremarkable IMPRESSION: No acute fracture or dislocation. Electronically Signed   By: Elgie Collard M.D.   On: 03/10/2023 03:10    Pending Labs Unresulted Labs (From admission, onward)    None       Vitals/Pain Today's Vitals   03/10/23 0804 03/10/23 0900 03/10/23 0930 03/10/23 0951  BP:  (!) 148/81 (!) 149/77   Pulse:  74 76   Resp:   18   Temp:    97.8 F (36.6 C)  TempSrc:    Oral  SpO2:  95% 94%   PainSc: 0-No pain       Isolation Precautions No active isolations  Medications Medications  heparin injection 5,000 Units (has no administration in time range)    Mobility walks with device     Focused Assessments musculoskeletal   R Recommendations: See Admitting Provider Note  Report given to:   Additional Notes: pt currently refusing straight cath; awaiting ortho's decision for surgery or not so currently NPO. Pt is persistently asking for tea.

## 2023-03-10 NOTE — ED Notes (Signed)
Ortho surgery at bedside.

## 2023-03-11 DIAGNOSIS — E785 Hyperlipidemia, unspecified: Secondary | ICD-10-CM

## 2023-03-11 DIAGNOSIS — I110 Hypertensive heart disease with heart failure: Secondary | ICD-10-CM | POA: Diagnosis not present

## 2023-03-11 DIAGNOSIS — S72114A Nondisplaced fracture of greater trochanter of right femur, initial encounter for closed fracture: Secondary | ICD-10-CM | POA: Diagnosis not present

## 2023-03-11 DIAGNOSIS — D500 Iron deficiency anemia secondary to blood loss (chronic): Secondary | ICD-10-CM

## 2023-03-11 DIAGNOSIS — E039 Hypothyroidism, unspecified: Secondary | ICD-10-CM

## 2023-03-11 DIAGNOSIS — I5032 Chronic diastolic (congestive) heart failure: Secondary | ICD-10-CM | POA: Diagnosis not present

## 2023-03-11 DIAGNOSIS — Z86711 Personal history of pulmonary embolism: Secondary | ICD-10-CM

## 2023-03-11 DIAGNOSIS — S72001A Fracture of unspecified part of neck of right femur, initial encounter for closed fracture: Secondary | ICD-10-CM

## 2023-03-11 LAB — BASIC METABOLIC PANEL
Anion gap: 12 (ref 5–15)
BUN: 27 mg/dL — ABNORMAL HIGH (ref 8–23)
CO2: 21 mmol/L — ABNORMAL LOW (ref 22–32)
Calcium: 9 mg/dL (ref 8.9–10.3)
Chloride: 105 mmol/L (ref 98–111)
Creatinine, Ser: 0.95 mg/dL (ref 0.44–1.00)
GFR, Estimated: 58 mL/min — ABNORMAL LOW (ref >60)
Glucose, Bld: 93 mg/dL (ref 70–99)
Potassium: 3.8 mmol/L (ref 3.5–5.1)
Sodium: 138 mmol/L (ref 135–145)

## 2023-03-11 LAB — SURGICAL PCR SCREEN
MRSA, PCR: NEGATIVE
Staphylococcus aureus: NEGATIVE

## 2023-03-11 LAB — CBC
HCT: 29 % — ABNORMAL LOW (ref 36.0–46.0)
Hemoglobin: 9.5 g/dL — ABNORMAL LOW (ref 12.0–15.0)
MCH: 28.1 pg (ref 26.0–34.0)
MCHC: 32.8 g/dL (ref 30.0–36.0)
MCV: 85.8 fL (ref 80.0–100.0)
Platelets: 318 10*3/uL (ref 150–400)
RBC: 3.38 MIL/uL — ABNORMAL LOW (ref 3.87–5.11)
RDW: 13.9 % (ref 11.5–15.5)
WBC: 11.1 10*3/uL — ABNORMAL HIGH (ref 4.0–10.5)
nRBC: 0 % (ref 0.0–0.2)

## 2023-03-11 LAB — IRON AND TIBC
Iron: 8 ug/dL — ABNORMAL LOW (ref 28–170)
Saturation Ratios: 2 % — ABNORMAL LOW (ref 10.4–31.8)
TIBC: 368 ug/dL (ref 250–450)
UIBC: 360 ug/dL

## 2023-03-11 LAB — VITAMIN B12: Vitamin B-12: 313 pg/mL (ref 180–914)

## 2023-03-11 LAB — FERRITIN: Ferritin: 46 ng/mL (ref 11–307)

## 2023-03-11 LAB — VITAMIN D 25 HYDROXY (VIT D DEFICIENCY, FRACTURES): Vit D, 25-Hydroxy: 23.13 ng/mL — ABNORMAL LOW (ref 30–100)

## 2023-03-11 LAB — FOLATE: Folate: 8.9 ng/mL (ref >5.9)

## 2023-03-11 MED ORDER — ATORVASTATIN CALCIUM 10 MG PO TABS
20.0000 mg | ORAL_TABLET | Freq: Every day | ORAL | Status: DC
  Administered 2023-03-11 – 2023-03-12 (×2): 20 mg via ORAL
  Filled 2023-03-11 (×2): qty 2

## 2023-03-11 MED ORDER — SODIUM CHLORIDE 0.9 % IV SOLN
250.0000 mg | Freq: Every day | INTRAVENOUS | Status: DC
  Administered 2023-03-11 – 2023-03-12 (×2): 250 mg via INTRAVENOUS
  Filled 2023-03-11 (×2): qty 20

## 2023-03-11 MED ORDER — VITAMIN D 25 MCG (1000 UNIT) PO TABS
1000.0000 [IU] | ORAL_TABLET | Freq: Every day | ORAL | Status: DC
  Administered 2023-03-11 – 2023-03-12 (×2): 1000 [IU] via ORAL
  Filled 2023-03-11 (×2): qty 1

## 2023-03-11 NOTE — Progress Notes (Signed)
Subjective:  No significant overnight events. Patient reports her pain is well-controlled. She reports only experiencing pain when using the right leg to move around in bed. She has not been OOB since admission. She denies numbness and tingling in the right leg. She also reports a mild cough since waking this morning but denies fevers and chills.   Objective:  Vital signs in last 24 hours: Vitals:   03/10/23 2009 03/11/23 0404 03/11/23 0726 03/11/23 0810  BP: (!) 99/48 128/66 124/77   Pulse: 60 73 77   Resp: 16 16 16    Temp: 98.6 F (37 C) 98 F (36.7 C) 97.7 F (36.5 C)   TempSrc: Oral  Oral   SpO2: 93% 95% 97% 96%  Height:       Intake/Output Summary (Last 24 hours) at 03/11/2023 1300 Last data filed at 03/11/2023 0100 Gross per 24 hour  Intake 195 ml  Output --  Net 195 ml   Physical Exam: Constitutional: Appears stated age. Sitting upright in bed. Appears comfortable and in no acute distress. Cardiovascular: Regular rate, regular rhythm. 3/6 systolic murmur heard best at RUSB. 3/6 systolic murmur heard best at apex. No LE edema.  Pulmonary: Normal respiratory effort on room air.   Abdominal: Soft. Non-distended. No tenderness. Normal bowel sounds.  Musculoskeletal: Active ROM limited by pain. TTP over right lateral hip. No ecchymosis, erythema, or swelling over right hip joint. Neurological: Alert and oriented to person, place, and time. Skin: Warm and dry.    Assessment/Plan:  Principal Problem:   Nondisplaced fracture of greater trochanter of right femur (HCC) Active Problems:   Bronchiectasis (HCC)   Chronic diastolic heart failure (HCC)   Hypertension   Nondisplaced intertrochanteric fracture of right femur, initial encounter for closed fracture [facility])  Nicole [name] is 88 year-old person with chronic diastolic heart failure, chronic bronchiectasis with previous LLL lobectomy, hypothyroidism, hypertension, and hyperlipidemia admitted 5/21 with nondisplaced  fracture of R greater trochanter.   #Nondisplaced fracture of R greater trochanter  # Osteoporosis Patient reports some RLE pain with movement in bed. She was able to ambulate 48 ft with no significant increase in pain. PT/OT recommending skilled inpatient follow up therapy <3 hours/day at discharge. She wanted to proceed with non-operative management when seen this morning. Will reassess later today since patient had not seen PT/OT before initial conversation. Will contact case management regarding PT/OT services available at [facility] for dispo planning. Will continue pain control regimen. Will supplement Vitamin D since low. Will continue vitamin D with calcium at discharge, bisphosphonates can be considered outpatient. - Appreciate orthopedics recommendations - Tylenol 1000 mg TID - Oxycodone 5 mg q4h PRN - Bowel regimen in place - Vitamin D3 1000 units daily  #Productive cough #History of bronchiectasis s/p LLL lobectomy Patient reports continued mild cough this morning. She remains afebrile with mild leukocytosis at 11.1 and is saturating well on room air. Suspect cough is secondary to recent immobility and underlying bronchiectasis given CXR showed atelectasis. Will not start antibiotics at this time. Continue home inhalers.  - Continue home inhalers - Incentive spirometry  #Chronic diastolic heart failure #Hypertension Patient appears euvolemic on exam. Blood pressures well-controlled. Will continue lisinopril. - lisinopril 10 mg daily  #History of provoked pulmonary embolism  Discontinued home apixaban 5 mg BID on admission since > 3 months since provoked PE. Subcutaneous heparin was started for VTE prohylaxis, though patient has refused 2 doses. - VTE prophylaxis with heparin  #Chronic normocytic anemia  Labs notable  for hemoglobin 9.5, saturation ratio 2, and B12/folate WNL, which is consistent with iron deficiency anemia. Iron deficit per Loel Ro equation is 766 mg.  Will order IV ferric gluconate 250 mg x 3 today - IV ferric gluconate 250 mg x 3   #Hyperlipidemia Patient takes atorvastatin 20 mg daily at home. - atorvastatin 20 mg daily   #Hypothyroidism Patient takes levothyroxine 75 mcg daily at home. - levothyroxine 75 mcg daily  Diet: HH Bowel: Senna VTE: Heparin IVF: None Code: Full PT/OT recs: SNF LOS: day 1  Prior to Admission Living Arrangement: ILF at [facility] Anticipated Discharge Location: TBD Barriers to Discharge: continued management  Whitman Hero, Medical Student 03/11/2023, 1:00 PM

## 2023-03-11 NOTE — TOC CAGE-AID Note (Signed)
Transition of Care [facility]) - CAGE-AID Screening   Patient Details  Name: Nicole [name] MRN: [id number] Date of Birth: 08/18/1935  Transition of Care [facility]) CM/SW Contact:    Leota Sauers, RN Phone Number: 03/11/2023, 8:43 PM   Clinical Narrative:  Patient denies use of alcohol and drugs. Education not offered at this time.  CAGE-AID Screening:    Have You Ever Felt You Ought to Cut Down on Your Drinking or Drug Use?: No Have People Annoyed You By Critizing Your Drinking Or Drug Use?: No Have You Felt Bad Or Guilty About Your Drinking Or Drug Use?: No Have You Ever Had a Drink or Used Drugs First Thing In The Morning to Steady Your Nerves or to Get Rid of a Hangover?: No CAGE-AID Score: 0  Substance Abuse Education Offered: No

## 2023-03-11 NOTE — Evaluation (Signed)
Physical Therapy Evaluation Patient Details Name: Nicole [name] MRN: [id number] DOB: 1934-11-05 Today's Date: 03/11/2023  History of Present Illness  Patient is a 88 year old female presenting after a fall with right greater trochanter fracture with intertrochanteric extension. Patient has elected for non-operative management and PT has been ordered for mobilization with hip fracture. Patient is coming from [facility].  Clinical Impression  Patient is agreeable to PT evaluation. She was seated in the chair on arrival to room with OT present. The patient reports she is modified independent with ambulation at her facility with a rolling walker. They provided a meal but she usually performs ADLs without assistance. Her daughter checks on her frequently and assists with her medication per patient report.  Today, the patient reports only mild pain with ambulation in the right hip. Gait training initiated, and patient walked 3ft with rolling walker with Min guard assistance and occasional cues for sequencing. She did need Min A for lifting to stand from the chair. Recommend PT follow to maximize independence and decrease caregiver burden. Anticipate patient may need increased physical assistance initially at discharge with continued PT recommended after this hospital stay.      Recommendations for follow up therapy are one component of a multi-disciplinary discharge planning process, led by the attending physician.  Recommendations may be updated based on patient status, additional functional criteria and insurance authorization.  Follow Up Recommendations       Assistance Recommended at Discharge Intermittent Supervision/Assistance  Patient can return home with the following  A little help with walking and/or transfers;A little help with bathing/dressing/bathroom;Help with stairs or ramp for entrance;Assist for transportation;Assistance with cooking/housework    Equipment Recommendations  None recommended by PT  Recommendations for Other Services       Functional Status Assessment Patient has had a recent decline in their functional status and demonstrates the ability to make significant improvements in function in a reasonable and predictable amount of time.     Precautions / Restrictions Precautions Precautions: Fall Restrictions Weight Bearing Restrictions: Yes RLE Weight Bearing: Weight bearing as tolerated      Mobility  Bed Mobility               General bed mobility comments: not assessed as patient sitting up on arrival and post session    Transfers Overall transfer level: Needs assistance Equipment used: Rolling walker (2 wheels) Transfers: Sit to/from Stand Sit to Stand: Min assist           General transfer comment: lifting assistance required to stand. verbal cues for hand placement for safety    Ambulation/Gait Ambulation/Gait assistance: Min guard Gait Distance (Feet): 48 Feet Assistive device: Rolling walker (2 wheels) Gait Pattern/deviations: Step-to pattern, Decreased stance time - right Gait velocity: decreased     General Gait Details: verbal cues for rolling walker and BLE sequencing. increased time with Min guard assistance provided  Stairs            Wheelchair Mobility    Modified Rankin (Stroke Patients Only)       Balance Overall balance assessment: Needs assistance Sitting-balance support: Feet supported Sitting balance-Leahy Scale: Good     Standing balance support: No upper extremity supported, During functional activity, Single extremity supported, Reliant on assistive device for balance Standing balance-Leahy Scale: Fair Standing balance comment: patient is able to stand for 5 minutes at the sink while washing arms and peri-area. Min guard provided for safety with no UE support. recommend to use  rolling walker for safety with BUE supported with dynamic activity/gait                              Pertinent Vitals/Pain Pain Assessment Pain Assessment: Faces Faces Pain Scale: Hurts a little bit Pain Location: R hip Pain Descriptors / Indicators: Discomfort Pain Intervention(s): Limited activity within patient's tolerance, Monitored during session, Repositioned    Home Living Family/patient expects to be discharged to:: Assisted living Living Arrangements: Alone               Home Equipment: Agricultural consultant (2 wheels) Additional Comments: Resident at [employer]; lunch time meal provided by [employer]. Daughter visits near [place] and patient reports she helps with medication management    Prior Function Prior Level of Function : Independent/Modified Independent;History of Falls (last six months)             Mobility Comments: using rolling walker for ambulation. history of multiple falls over the past 6 months ADLs Comments: patient reports Mod I with dressing, bathing, toileting     Hand Dominance        Extremity/Trunk Assessment   Upper Extremity Assessment Upper Extremity Assessment: Overall WFL for tasks assessed    Lower Extremity Assessment Lower Extremity Assessment: RLE deficits/detail RLE Deficits / Details: patient able to complete LAQ independently while sitting and activate hip/knee/ankle movement. mild pain with ambulation and mild hip flexion. no knee buckling with weight bearing RLE Sensation: WNL       Communication   Communication: No difficulties  Cognition Arousal/Alertness: Awake/alert Behavior During Therapy: WFL for tasks assessed/performed Overall Cognitive Status: Within Functional Limits for tasks assessed                                 General Comments: patient needs cues for attention to task at times. cues for safety provided with mobility        General Comments      Exercises     Assessment/Plan    PT Assessment Patient needs continued PT services  PT Problem List Decreased  strength;Decreased range of motion;Decreased balance;Decreased activity tolerance;Decreased mobility;Decreased safety awareness;Decreased knowledge of precautions;Decreased knowledge of use of DME       PT Treatment Interventions DME instruction;Gait training;Stair training;Functional mobility training;Therapeutic activities;Therapeutic exercise;Balance training;Neuromuscular re-education;Cognitive remediation;Patient/family education;Wheelchair mobility training    PT Goals (Current goals can be found in the Care Plan section)  Acute Rehab PT Goals Patient Stated Goal: to avoid having surgery PT Goal Formulation: With patient Time For Goal Achievement: 03/18/23 Potential to Achieve Goals: Good    Frequency Min 3X/week     Co-evaluation               AM-PAC PT "6 Clicks" Mobility  Outcome Measure Help needed turning from your back to your side while in a flat bed without using bedrails?: A Little Help needed moving from lying on your back to sitting on the side of a flat bed without using bedrails?: A Little Help needed moving to and from a bed to a chair (including a wheelchair)?: A Little Help needed standing up from a chair using your arms (e.g., wheelchair or bedside chair)?: A Little Help needed to walk in hospital room?: A Little Help needed climbing 3-5 steps with a railing? : A Lot 6 Click Score: 17    End of Session Equipment Utilized  During Treatment: Gait belt Activity Tolerance: Patient tolerated treatment well Patient left: in chair;with call bell/phone within reach;with chair alarm set Nurse Communication: Mobility status PT Visit Diagnosis: Other abnormalities of gait and mobility (R26.89);Difficulty in walking, not elsewhere classified (R26.2)    Time: 1610-9604 PT Time Calculation (min) (ACUTE ONLY): 33 min   Charges:   PT Evaluation $PT Eval Low Complexity: 1 Low PT Treatments $Gait Training: 8-22 mins        Donna Bernard, PT, MPT   Ina Homes 03/11/2023, 9:56 AM

## 2023-03-11 NOTE — Plan of Care (Signed)
  Problem: Clinical Measurements: Goal: Respiratory complications will improve Outcome: Progressing   Problem: Elimination: Goal: Will not experience complications related to bowel motility Outcome: Progressing   Problem: Pain Managment: Goal: General experience of comfort will improve Outcome: Progressing   

## 2023-03-11 NOTE — Care Management Obs Status (Signed)
MEDICARE OBSERVATION STATUS NOTIFICATION   Patient Details  Name: RUCHY UMBACH MRN: [id number] Date of Birth: [birth year]   Medicare Observation Status Notification Given:  Yes    Yanelli Helfgott, LCSW 03/11/2023, 1:16 PM

## 2023-03-11 NOTE — TOC Initial Note (Signed)
Transition of Care [facility]) - Initial/Assessment Note    Patient Details  Name: Nicole [name] MRN: [id number] Date of Birth: 1934-12-30  [facility]) CM/SW Contact:    Carley Hammed, LCSW Phone Number: 03/11/2023, 1:49 PM  Clinical Narrative:                 CSW noting pt worked with PT this morning and was able to ambulate fairly well. CSW met with pt and son  in law at bedside. Pt was advised of Observation status and the barriers to Medicare paying for SNF. Pt states she would not want to go to SNF, and wants to return home to her "own little house". Pt states her dtr has assistance come in and help her occasionally. She is agreeable to CSW speaking with her dtr. CSW spoke with him who notes concern with pt returning to ILF. CSW advised of Medicare barriers and pt choice. [facility] B[facility] states they have struggled for some time with the pt, but have her on the list for ALF. Selena Batten states they will discuss getting 24 hour care for pt. [facility] states that pt is next on the list for ALF and they do not have any SNF beds. Pt agreeable to resumption orders for [facility]. TOC will continue to follow for DC needs.   Expected Discharge Plan: Home w Home Health Services Barriers to Discharge: Continued Medical Work up   Patient Goals and CMS Choice Patient states their goals for this hospitalization and ongoing recovery are:: Pt wants to return home with additional supports.   Choice offered to / list presented to : Patient, Adult Children      Expected Discharge Plan and Services     Post Acute Care Choice: Home Health Living arrangements for the past 2 months: Independent Living Facility                                      Prior Living Arrangements/Services Living arrangements for the past 2 months: Independent Living Facility Lives with:: Self Patient language and need for interpreter reviewed:: Yes Do you feel safe going back to the place where you live?: Yes      Need for  Family Participation in Patient Care: Yes (Comment) Care giver support system in place?: Yes (comment) Current home services: Home PT, Home OT Criminal Activity/Legal Involvement Pertinent to Current Situation/Hospitalization: No - Comment as needed  Activities of Daily Living Home Assistive Devices/Equipment: Walker (specify type), Grab bars in shower ADL Screening (condition at time of admission) Patient's cognitive ability adequate to safely complete daily activities?: Yes Is the patient deaf or have difficulty hearing?: No Does the patient have difficulty seeing, even when wearing glasses/contacts?: No Does the patient have difficulty concentrating, remembering, or making decisions?: No Patient able to express need for assistance with ADLs?: Yes Does the patient have difficulty dressing or bathing?: Yes Independently performs ADLs?: No Communication: Independent Dressing (OT): Needs assistance Is this a change from baseline?: Change from baseline, expected to last >3 days Grooming: Needs assistance Is this a change from baseline?: Change from baseline, expected to last >3 days Feeding: Independent Bathing: Needs assistance Is this a change from baseline?: Change from baseline, expected to last >3 days Toileting: Needs assistance Is this a change from baseline?: Change from baseline, expected to last >3days In/Out Bed: Needs assistance Is this a change from baseline?: Change from baseline, expected  to last >3 days Walks in Home: Needs assistance Is this a change from baseline?: Change from baseline, expected to last >3 days Does the patient have difficulty walking or climbing stairs?: Yes Weakness of Legs: Both Weakness of Arms/Hands: None  Permission Sought/Granted Permission sought to share information with : Family Supports Permission granted to share information with : Yes, Verbal Permission Granted  Share Information with NAME: Selena Batten     Permission granted to share info w  Relationship: daughter     Emotional Assessment Appearance:: Appears stated age Attitude/Demeanor/Rapport: Engaged Affect (typically observed): Appropriate Orientation: : Oriented to Self, Oriented to Place, Oriented to  Time, Oriented to Situation Alcohol / Substance Use: Not Applicable Psych Involvement: No (comment)  Admission diagnosis:  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 [S72.111K] Closed fracture of right hip, initial encounter (HCC) [S72.001A] Patient Active Problem List   Diagnosis Date Noted   Nondisplaced fracture of greater trochanter of right femur (HCC) 03/10/2023   Chronic diastolic heart failure (HCC) 03/10/2023   Nondisplaced intertrochanteric fracture of right femur, initial encounter for closed fracture (HCC) 03/10/2023   History of pulmonary embolus (PE) 11/18/2022   Malnutrition of moderate degree 10/01/2022   Hyponatremia 09/30/2022   Bradycardia 09/29/2022   Hematemesis 09/29/2022   Hyperkalemia 09/29/2022   Advance care planning 09/29/2022   Bronchiectasis (HCC) 07/16/2022   Hypothyroidism 07/16/2022   Chronic obstructive lung disease (HCC) 03/24/2022   Hypertension 03/20/2022   PCP:  Mercy Moore, MD Pharmacy:   [facility] [place], [place] - [address] [place] [address] Phone: [phone] Fax: [phone]     Social Determinants of Health (SDOH) Social History: SDOH Screenings   Food Insecurity: No Food Insecurity (03/10/2023)  Housing: Low Risk  (03/10/2023)  Transportation Needs: No Transportation Needs (03/10/2023)  Utilities: Not At Risk (03/10/2023)  Tobacco Use: Low Risk  (03/10/2023)   SDOH Interventions:     Readmission Risk Interventions     No data to display

## 2023-03-11 NOTE — Progress Notes (Signed)
Orthopedic Surgery Progress Note   Assessment: Patient is a 88 y.o. female with greater trochanter fracture with intertrochanteric extension   Plan: -Went over risks, benefits, and alternatives of surgery again this morning. Patient still does not want to proceed with surgery. Will attempt mobilization with PT today. I will plan to check on her this evening and see if she would like to continue with non-operative treatment after working with PT. -PT evaluate and treat -Dispo: pending mobilization trial   ___________________________________________________________________________  Subjective: No acute events overnight. Expressed more concern about her chronic morning cough than her hip. States she is not having much hip pain when laying in bed. Has not tried mobilizing since she was seen yesterday. Denies paresthesias and numbness.    Physical Exam:  General: no acute distress, appears stated age Neurologic: alert, answering questions appropriately, following commands Respiratory: unlabored breathing on room air, symmetric chest rise Psychiatric: appropriate affect, normal cadence to speech  MSK:    -Right lower extremity  No tenderness to palpation over extremity, except over the right hip. Pain with log roll. No gross deformity Fires hip flexors, quadriceps, hamstrings, tibialis anterior, gastrocnemius and soleus, extensor hallucis longus Plantarflexes and dorsiflexes toes Sensation intact to light touch in sural, saphenous, tibial, deep peroneal, and superficial peroneal nerve distributions Foot warm and well perfused, palpable DP pulse   Patient name: Nicole [name] Patient MRN: [id number] Date: 03/11/23

## 2023-03-11 NOTE — Evaluation (Signed)
Occupational Therapy Evaluation Patient Details Name: Nicole [name] MRN: [id number] DOB: [birth year] Today's Date: 03/11/2023   History of Present Illness 88 y.o. female who presented from ILF after a ground level fall. Imaging revealed greater trochanter fracture with intertrochanteric extension, per pt's wishes, non-op management with RLE WBAT. PMHx: 2 recent admissions for PNA & AKI (12/23 and 1/24), chronic diastolic HF, hypothyroidism, hypertension, hyperlipidemia   Clinical Impression   Nicole [name] was evaluated s/p the above admission list. She is from [facility], uses a rollator to get to dining hall, is mod I for [employer] and reports a few recent falls at baseline. Upon evaluation she was limited by RLE pain and decreased ROM, impaired cognition, decreased activity tolerance and impaired balance. Overall she required min A for bed mobility with STS transfers but tolerated WBing on RLE without a significant increase in pain. Due to the deficits listed below she also needs up to mod A for LB ADLs and set up A for UB ADLs. Pt will benefit from continued acute OT services and skilled inpatient follow up therapy, <3 hours/day at [facility] - unless pt can have increased assist in current environment with HHOT.       Recommendations for follow up therapy are one component of a multi-disciplinary discharge planning process, led by the attending physician.  Recommendations may be updated based on patient status, additional functional criteria and insurance authorization.   Assistance Recommended at Discharge Frequent or constant Supervision/Assistance  Patient can return home with the following A little help with walking and/or transfers;A lot of help with bathing/dressing/bathroom;Assistance with cooking/housework;Assist for transportation;Help with stairs or ramp for entrance;Direct supervision/assist for financial management;Direct supervision/assist for medications management     Functional Status Assessment  Patient has had a recent decline in their functional status and demonstrates the ability to make significant improvements in function in a reasonable and predictable amount of time.  Equipment Recommendations  None recommended by OT       Precautions / Restrictions Precautions Precautions: Fall Restrictions Weight Bearing Restrictions: Yes RLE Weight Bearing: Weight bearing as tolerated      Mobility Bed Mobility Overal bed mobility: Needs Assistance Bed Mobility: Supine to Sit     Supine to sit: Min assist          Transfers Overall transfer level: Needs assistance Equipment used: 1 person hand held assist Transfers: Sit to/from Stand Sit to Stand: Min assist                  Balance Overall balance assessment: Needs assistance Sitting-balance support: Feet supported Sitting balance-Leahy Scale: Good     Standing balance support: Single extremity supported, During functional activity Standing balance-Leahy Scale: Fair                             ADL either performed or assessed with clinical judgement   ADL Overall ADL's : Needs assistance/impaired Eating/Feeding: Independent;Sitting   Grooming: Set up;Sitting   Upper Body Bathing: Set up;Sitting   Lower Body Bathing: Minimal assistance;Sit to/from stand   Upper Body Dressing : Set up;Sitting   Lower Body Dressing: Sit to/from stand;Moderate assistance   Toilet Transfer: Minimal assistance;Ambulation;Rolling walker (2 wheels) Toilet Transfer Details (indicate cue type and reason): HAA this session, likely min G with RW Toileting- Clothing Manipulation and Hygiene: Min guard;Sitting/lateral lean       Functional mobility during ADLs: Minimal assistance General ADL Comments: cues for attention  and re-direction. min A for pain with RLE WBing     Vision Baseline Vision/History: 0 No visual deficits Vision Assessment?: No apparent visual deficits      Perception Perception Perception Tested?: No   Praxis Praxis Praxis tested?: Not tested    Pertinent Vitals/Pain Pain Assessment Pain Assessment: Faces Faces Pain Scale: Hurts a little bit Pain Location: R hip Pain Descriptors / Indicators: Discomfort Pain Intervention(s): Limited activity within patient's tolerance, Monitored during session     Hand Dominance Right   Extremity/Trunk Assessment Upper Extremity Assessment Upper Extremity Assessment: Generalized weakness   Lower Extremity Assessment Lower Extremity Assessment: Defer to PT evaluation RLE Deficits / Details: patient able to complete LAQ independently while sitting and activate hip/knee/ankle movement. mild pain with ambulation and mild hip flexion. no knee buckling with weight bearing RLE Sensation: WNL   Cervical / Trunk Assessment Cervical / Trunk Assessment: Kyphotic (mild)   Communication Communication Communication: No difficulties (verbose)   Cognition Arousal/Alertness: Awake/alert Behavior During Therapy: WFL for tasks assessed/performed Overall Cognitive Status: Within Functional Limits for tasks assessed                                 General Comments: patient needs cues for attention to task at times. cues for safety provided with mobility     General Comments  VSS on RA, no significant increase in pain with movement or WBing     Home Living Family/patient expects to be discharged to:: Private residence (ILF at [facility]) Living Arrangements: Alone Available Help at Discharge: Family;Available PRN/intermittently;Other (Comment) (staff at ILF) Type of Home: Apartment Home Access: Level entry     Home Layout: One level     Bathroom Shower/Tub: Producer, television/film/video: Handicapped height     Home Equipment: Agricultural consultant (2 wheels);Rollator (4 wheels);Grab bars - tub/shower   Additional Comments: Resident at [employer]; lunch time meal  provided by [employer]. Daughter visits near daily and patient reports she helps with medication management      Prior Functioning/Environment Prior Level of Function : Independent/Modified Independent;History of Falls (last six months)             Mobility Comments: using rolling walker for ambulation out of the room, no AD in the room. history of multiple falls over the past 6 months ADLs Comments: patient reports Mod I with dressing, bathing, toileting        OT Problem List: Decreased strength;Decreased range of motion;Decreased activity tolerance;Impaired balance (sitting and/or standing);Decreased safety awareness;Decreased knowledge of use of DME or AE;Decreased knowledge of precautions;Pain      OT Treatment/Interventions: Self-care/ADL training;Therapeutic exercise;DME and/or AE instruction;Therapeutic activities;Patient/family education;Balance training    OT Goals(Current goals can be found in the care plan section) Acute Rehab OT Goals Patient Stated Goal: to feel better OT Goal Formulation: With patient Time For Goal Achievement: 03/25/23 Potential to Achieve Goals: Good ADL Goals Pt Will Perform Grooming: with modified independence;standing Pt Will Perform Lower Body Dressing: with modified independence;sit to/from stand Pt Will Transfer to Toilet: with modified independence;ambulating Pt Will Perform Toileting - Clothing Manipulation and hygiene: with modified independence;sitting/lateral leans  OT Frequency: Min 2X/week       AM-PAC OT "6 Clicks" Daily Activity     Outcome Measure Help from another person eating meals?: None Help from another person taking care of personal grooming?: A Little Help from another person toileting, which includes  using toliet, bedpan, or urinal?: A Little Help from another person bathing (including washing, rinsing, drying)?: A Little Help from another person to put on and taking off regular upper body clothing?: None Help from  another person to put on and taking off regular lower body clothing?: A Lot 6 Click Score: 19   End of Session Equipment Utilized During Treatment: Gait belt Nurse Communication: Mobility status  Activity Tolerance: Patient tolerated treatment well Patient left: in chair;with call bell/phone within reach;Other (comment) (hand off to PT)  OT Visit Diagnosis: Unsteadiness on feet (R26.81);Other abnormalities of gait and mobility (R26.89);Repeated falls (R29.6);Muscle weakness (generalized) (M62.81);History of falling (Z91.81);Pain                Time: 9562-1308 OT Time Calculation (min): 22 min Charges:  OT General Charges $OT Visit: 1 Visit OT Evaluation $OT Eval Moderate Complexity: 1 Mod  Derenda Mis, OTR/L Acute Rehabilitation Services Office [phone] Secure Chat Communication Preferred   Donia Pounds 03/11/2023, 11:38 AM

## 2023-03-12 ENCOUNTER — Other Ambulatory Visit (HOSPITAL_COMMUNITY): Payer: Self-pay

## 2023-03-12 DIAGNOSIS — E8809 Other disorders of plasma-protein metabolism, not elsewhere classified: Secondary | ICD-10-CM | POA: Diagnosis present

## 2023-03-12 DIAGNOSIS — D72829 Elevated white blood cell count, unspecified: Secondary | ICD-10-CM | POA: Diagnosis present

## 2023-03-12 DIAGNOSIS — Y9301 Activity, walking, marching and hiking: Secondary | ICD-10-CM | POA: Diagnosis present

## 2023-03-12 DIAGNOSIS — J9811 Atelectasis: Secondary | ICD-10-CM | POA: Diagnosis present

## 2023-03-12 DIAGNOSIS — Z7901 Long term (current) use of anticoagulants: Secondary | ICD-10-CM | POA: Diagnosis not present

## 2023-03-12 DIAGNOSIS — I4510 Unspecified right bundle-branch block: Secondary | ICD-10-CM | POA: Diagnosis present

## 2023-03-12 DIAGNOSIS — J479 Bronchiectasis, uncomplicated: Secondary | ICD-10-CM

## 2023-03-12 DIAGNOSIS — Z902 Acquired absence of lung [part of]: Secondary | ICD-10-CM | POA: Diagnosis not present

## 2023-03-12 DIAGNOSIS — I110 Hypertensive heart disease with heart failure: Secondary | ICD-10-CM | POA: Diagnosis present

## 2023-03-12 DIAGNOSIS — E039 Hypothyroidism, unspecified: Secondary | ICD-10-CM | POA: Diagnosis present

## 2023-03-12 DIAGNOSIS — Z79890 Hormone replacement therapy (postmenopausal): Secondary | ICD-10-CM | POA: Diagnosis not present

## 2023-03-12 DIAGNOSIS — Z885 Allergy status to narcotic agent status: Secondary | ICD-10-CM | POA: Diagnosis not present

## 2023-03-12 DIAGNOSIS — S72114A Nondisplaced fracture of greater trochanter of right femur, initial encounter for closed fracture: Secondary | ICD-10-CM | POA: Diagnosis not present

## 2023-03-12 DIAGNOSIS — E785 Hyperlipidemia, unspecified: Secondary | ICD-10-CM | POA: Diagnosis present

## 2023-03-12 DIAGNOSIS — R296 Repeated falls: Secondary | ICD-10-CM | POA: Diagnosis present

## 2023-03-12 DIAGNOSIS — Z880 Allergy status to penicillin: Secondary | ICD-10-CM | POA: Diagnosis not present

## 2023-03-12 DIAGNOSIS — R0902 Hypoxemia: Secondary | ICD-10-CM | POA: Diagnosis present

## 2023-03-12 DIAGNOSIS — Z66 Do not resuscitate: Secondary | ICD-10-CM | POA: Diagnosis present

## 2023-03-12 DIAGNOSIS — Z7951 Long term (current) use of inhaled steroids: Secondary | ICD-10-CM | POA: Diagnosis not present

## 2023-03-12 DIAGNOSIS — Z79899 Other long term (current) drug therapy: Secondary | ICD-10-CM | POA: Diagnosis not present

## 2023-03-12 DIAGNOSIS — D509 Iron deficiency anemia, unspecified: Secondary | ICD-10-CM | POA: Diagnosis present

## 2023-03-12 DIAGNOSIS — W010XXA Fall on same level from slipping, tripping and stumbling without subsequent striking against object, initial encounter: Secondary | ICD-10-CM | POA: Diagnosis present

## 2023-03-12 DIAGNOSIS — M80051A Age-related osteoporosis with current pathological fracture, right femur, initial encounter for fracture: Secondary | ICD-10-CM | POA: Diagnosis present

## 2023-03-12 DIAGNOSIS — I5032 Chronic diastolic (congestive) heart failure: Secondary | ICD-10-CM | POA: Diagnosis present

## 2023-03-12 DIAGNOSIS — J449 Chronic obstructive pulmonary disease, unspecified: Secondary | ICD-10-CM | POA: Diagnosis present

## 2023-03-12 DIAGNOSIS — M160 Bilateral primary osteoarthritis of hip: Secondary | ICD-10-CM | POA: Diagnosis present

## 2023-03-12 DIAGNOSIS — Z86711 Personal history of pulmonary embolism: Secondary | ICD-10-CM | POA: Diagnosis not present

## 2023-03-12 DIAGNOSIS — S72116A Nondisplaced fracture of greater trochanter of unspecified femur, initial encounter for closed fracture: Secondary | ICD-10-CM | POA: Diagnosis present

## 2023-03-12 LAB — BASIC METABOLIC PANEL WITH GFR
Anion gap: 8 (ref 5–15)
BUN: 24 mg/dL — ABNORMAL HIGH (ref 8–23)
CO2: 22 mmol/L (ref 22–32)
Calcium: 8.5 mg/dL — ABNORMAL LOW (ref 8.9–10.3)
Chloride: 106 mmol/L (ref 98–111)
Creatinine, Ser: 0.94 mg/dL (ref 0.44–1.00)
GFR, Estimated: 59 mL/min — ABNORMAL LOW
Glucose, Bld: 112 mg/dL — ABNORMAL HIGH (ref 70–99)
Potassium: 3.4 mmol/L — ABNORMAL LOW (ref 3.5–5.1)
Sodium: 136 mmol/L (ref 135–145)

## 2023-03-12 LAB — CBC
HCT: 24.9 % — ABNORMAL LOW (ref 36.0–46.0)
Hemoglobin: 8 g/dL — ABNORMAL LOW (ref 12.0–15.0)
MCH: 27.9 pg (ref 26.0–34.0)
MCHC: 32.1 g/dL (ref 30.0–36.0)
MCV: 86.8 fL (ref 80.0–100.0)
Platelets: 251 10*3/uL (ref 150–400)
RBC: 2.87 MIL/uL — ABNORMAL LOW (ref 3.87–5.11)
RDW: 14.1 % (ref 11.5–15.5)
WBC: 9.5 10*3/uL (ref 4.0–10.5)
nRBC: 0 % (ref 0.0–0.2)

## 2023-03-12 MED ORDER — VITAMIN D3 25 MCG PO TABS
1000.0000 [IU] | ORAL_TABLET | Freq: Every day | ORAL | 0 refills | Status: DC
  Filled 2023-03-12: qty 30, 30d supply, fill #0

## 2023-03-12 MED ORDER — POTASSIUM CHLORIDE CRYS ER 20 MEQ PO TBCR
40.0000 meq | EXTENDED_RELEASE_TABLET | Freq: Once | ORAL | Status: AC
  Administered 2023-03-12: 40 meq via ORAL
  Filled 2023-03-12: qty 2

## 2023-03-12 NOTE — Discharge Summary (Signed)
Name: Nicole [name] MRN: [id number] DOB: 1935/03/24 88 y.o. PCP: Mercy Moore, MD  Date of Admission: 03/10/2023  1:47 AM Date of Discharge: 03/12/2023 Attending Physician: Dr.  Lafonda Mosses  Discharge Diagnosis: Principal Problem:   Nondisplaced fracture of greater trochanter of right femur (HCC) Active Problems:   Bronchiectasis (HCC)   Chronic diastolic heart failure (HCC)   Hypertension   Nondisplaced intertrochanteric fracture of right femur, initial encounter for closed fracture (HCC)   Nondisplaced fracture of greater trochanter of femur (HCC)   Bronchiectasis without acute exacerbation (HCC)    Discharge Medications: Allergies as of 03/12/2023       Reactions   Codeine Nausea Only, Other (See Comments)   Dizziness    Penicillins Other (See Comments)   Unknown        Medication List     STOP taking these medications    apixaban 5 MG Tabs tablet Commonly known as: ELIQUIS   meclizine 25 MG tablet Commonly known as: ANTIVERT   metoprolol succinate 50 MG 24 hr tablet Commonly known as: TOPROL-XL   verapamil 180 MG CR tablet Commonly known as: CALAN-SR       TAKE these medications    acetaminophen 325 MG tablet Commonly known as: TYLENOL Take 325 mg by mouth daily as needed for moderate pain or headache.   albuterol 108 (90 Base) MCG/ACT inhaler Commonly known as: VENTOLIN HFA Inhale 2 puffs into the lungs every 4 (four) hours as needed for wheezing or shortness of breath.   atorvastatin 20 MG tablet Commonly known as: LIPITOR Take 20 mg by mouth daily.   fluticasone-salmeterol 250-50 MCG/ACT Aepb Commonly known as: ADVAIR Inhale 1 puff into the lungs 2 (two) times daily.   folic acid 1 MG tablet Commonly known as: FOLVITE Take 1 tablet (1 mg total) by mouth daily.   Incruse Ellipta 62.5 MCG/ACT Aepb Generic drug: umeclidinium bromide Inhale 1 puff into the lungs daily.   latanoprost 0.005 % ophthalmic solution Commonly known as:  XALATAN Place 1 drop into both eyes at bedtime.   levocetirizine 5 MG tablet Commonly known as: XYZAL Take 5 mg by mouth daily as needed for allergies.   levothyroxine 75 MCG tablet Commonly known as: SYNTHROID Take 75 mcg by mouth daily.   lisinopril 5 MG tablet Commonly known as: ZESTRIL Take 1 tablet (5 mg total) by mouth daily.   montelukast 10 MG tablet Commonly known as: SINGULAIR Take 10 mg by mouth daily.   multivitamin with minerals Tabs tablet Take 1 tablet by mouth daily.   ondansetron 4 MG tablet Commonly known as: ZOFRAN Take 1 tablet (4 mg total) by mouth every 6 (six) hours as needed for nausea.   pantoprazole 40 MG tablet Commonly known as: PROTONIX Take 1 tablet (40 mg total) by mouth 2 (two) times daily.   polyethylene glycol 17 g packet Commonly known as: MIRALAX / GLYCOLAX Take 17 g by mouth daily.   senna-docusate 8.6-50 MG tablet Commonly known as: Senokot-S Take 1 tablet by mouth at bedtime.   vitamin D3 25 MCG tablet Commonly known as: CHOLECALCIFEROL Take 1 tablet (1,000 Units total) by mouth daily. Start taking on: Mar 13, 2023               Durable Medical Equipment  (From admission, onward)           Start     Ordered   03/12/23 1214  For home use only DME 3 n 1  Once  03/12/23 1214            Disposition and follow-up:   Ms.Nicole [name] was discharged from [facility] in Stable condition.  At the hospital follow up visit please address:  1.  Follow-up:  a.  Ms. Macik has elected to pursue nonoperative treatment of her femur fracture.  She will get home health PT and OT at her independent living facility.  She has been started on vitamin D and would also benefit from calcium supplementation in the outpatient setting for newly diagnosed osteoporosis.  I would consider adding on a bisphosphonate in the future as well.  Conservative pain management with Tylenol.    b.  Continue home inhalers for  her bronchiectasis regimen   c.  Continue home dose of lisinopril.  I have held metoprolol and verapamil at discharge as her blood pressures are stable on just her lisinopril.  I have also held meclizine as this can increase her risk for drowsiness and falls as well as apixaban as she is more than 3 months out from her provoked thromboembolic event  2.  Labs / imaging needed at time of follow-up: None  3.  Pending labs/ test needing follow-up: None  Follow-up Appointments:  Follow-up Information     [place], [facility] Follow up.   Contact information: [address] [place] [address] [phone]                 Hospital Course by problem list: Principal Problem:   Nondisplaced fracture of greater trochanter of right femur (HCC) Active Problems:   Bronchiectasis (HCC)   Chronic diastolic heart failure (HCC)   Hypertension   Closed fracture of right hip (HCC)   #Nondisplaced fracture of R greater trochanter Patient presenting after ground-level mechanical fall. She does not endorse a history of a prodrome proceeding the event and she appears to be neurologically at baseline. Thankfully at this time she does not appear to be in much acute pain. Appreciate orthopedics' assistance with this case.  Nondisplaced fracture of right greater trochanter visualized on MRI after initial x-ray and CT.  She was offered operative treatment, but declined on several occasions.  She was mobilized with PT.  She will continue home health PT in the outpatient setting.  She was started on vitamin D supplementation for fragility fracture during this admission and would also benefit from calcium supplementation in the outpatient setting.  I would also consider starting bisphosphonate down the road.  Conservative pain management with Tylenol was sufficient.   #Productive cough #History of bronchiectasis s/p LLL lobectomy Patient reports having productive cough for the last 10  days.  Initial workup for pneumonia was negative.  Suspect this is due to combination of her underlying bronchiectasis and also possibly atelectasis.  Her home inhaler regimen was continued during the hospital stay and she will resume this at discharge.  Would also strongly recommend continuing incentive spirometry.   #Chronic diastolic heart failure #Hypertension Patient mentions that she is unsure exactly which antihypertensives and heart failure medications she was taking in the outpatient setting.  Her home medication list includes lisinopril, metoprolol, verapamil, and she also verbally endorses taking HCTZ as needed for edema but this does not show up on her medication list.  Her blood pressure during the admission was controlled with just her lisinopril.  She appeared euvolemic throughout the admission.  Will continue just lisinopril at discharge, will DC beta-blocker and verapamil due to concern for  orthostasis at her age.  Can titrate outpatient.   #History of provoked pulmonary embolism Pulmonary embolism was in December 2023 after a few days in the ICU. Patient was not getting prophylactic anticoagulation at the time due to concern for acute blood loss anemia. She has been receiving apixaban since then. Apixaban was discontinued on admission since PE > 3 months ago and will be discontinued at discharge.    #Chronic normocytic anemia  Chronic normocytic anemia noted on admission with hemoglobin 8.3. Anemia labs showed iron 8, saturation ratio 2, B12/folate WNL, which is consistent with iron deficiency anemia. Iron deficit per Loel Ro equation was 766 mg. Patient received IV ferric gluconate 250 mg x 2. Recommend taking iron supplement at home given remaining 250 mg iron deficit.  #Hyperlipidemia Patient takes atorvastatin 20 mg daily at home. Continued during hospitalization and will continue at discharge.   #Hypothyroidism Patient takes levothyroxine 75 mcg daily at home. Continued during  hospitalization and will continue at discharge.   Subjective: Patient denies any pain today.  She has been ambulatory and mobile with PT.  She has had persistent cough, but no dyspnea, chest pain, fevers, or hypoxia.  Thoroughly discussed disposition planning.  She prefers strongly to go back to her independent living facility.  Discussed this with her daughter as well who has arranged 24/7 supervision for her at her facility.  They are amenable to discharge at this time with home health PT OT and 24/7 supervision at her independent living facility.  Discharge Vitals:   BP 125/80 (BP Location: Left Arm)   Pulse 93   Temp 98.2 F (36.8 C) (Oral)   Resp 16   Ht 5\' 3"  (1.6 m)   SpO2 97%   BMI 17.29 kg/m  Discharge exam: General: Elderly appearing, NAD CV: RRR. No murmurs, rubs, or gallops. No LE edema Cardiovascular: Regular rate, regular rhythm. 3/6 systolic murmur heard best at RUSB. 3/6 systolic murmur heard best at apex. No LE edema.  Pulmonary: Normal respiratory effort on room air.   Abdominal: Soft. Non-distended. No tenderness. Normal bowel sounds.  Musculoskeletal: Improved TTP over right leg. No ecchymosis, erythema, or swelling over right hip joint. Neurological: Alert and oriented to person, place, and time. Skin: Warm and dry.   Neuro: A&Ox3. Moves all extremities. Normal sensation. No focal deficit. Psych: Normal mood and affect   Pertinent Labs, Studies, and Procedures:     Latest Ref Rng & Units 03/12/2023   12:21 AM 03/11/2023    1:12 AM 03/10/2023    6:40 AM  CBC  WBC 4.0 - 10.5 K/uL 9.5  11.1  11.6   Hemoglobin 12.0 - 15.0 g/dL 8.0  9.5  8.3   Hematocrit 36.0 - 46.0 % 24.9  29.0  25.6   Platelets 150 - 400 K/uL 251  318  285        Latest Ref Rng & Units 03/12/2023   12:21 AM 03/11/2023    1:12 AM 03/10/2023    6:40 AM  CMP  Glucose 70 - 99 mg/dL 161  93  96   BUN 8 - 23 mg/dL 24  27  29    Creatinine 0.44 - 1.00 mg/dL 0.96  0.45  4.09   Sodium 135 - 145  mmol/L 136  138  136   Potassium 3.5 - 5.1 mmol/L 3.4  3.8  3.6   Chloride 98 - 111 mmol/L 106  105  104   CO2 22 - 32 mmol/L 22  21  24  Calcium 8.9 - 10.3 mg/dL 8.5  9.0  9.0   Total Protein 6.5 - 8.1 g/dL   6.2   Total Bilirubin 0.3 - 1.2 mg/dL   0.6   Alkaline Phos 38 - 126 U/L   101   AST 15 - 41 U/L   17   ALT 0 - 44 U/L   16     DG CHEST PORT 1 VIEW  Result Date: 03/10/2023 CLINICAL DATA:  Recent fall with hip fracture. EXAM: PORTABLE CHEST 1 VIEW COMPARISON:  X-ray 10/28/2022 FINDINGS: Hyperinflation. Slight elevation of the left hemidiaphragm. There is some bandlike changes along both lung bases. Atelectasis is favored. Left-greater-than-right. Left has slightly more ill-defined opacity. Recommend follow-up. No pneumothorax or edema. Film is rotated to the left. Normal cardiopericardial silhouette. Osteopenia with degenerative changes. Apical pleural thickening. IMPRESSION: Slight elevation of the left hemidiaphragm. Hyperinflation with bandlike opacities along the lung bases. Slightly more ill-defined on the left than right. Atelectasis is favored over infiltrate recommend follow-up. Rotated radiograph. Electronically Signed   By: Karen Kays M.D.   On: 03/10/2023 12:38   MR HIP RIGHT WO CONTRAST  Result Date: 03/10/2023 CLINICAL DATA:  Fracture, hip EXAM: MR OF THE RIGHT HIP WITHOUT CONTRAST TECHNIQUE: Multiplanar, multisequence MR imaging was performed. No intravenous contrast was administered. COMPARISON:  None Available. FINDINGS: Bones: There is a nondisplaced fracture extending vertically from the greater trochanter into the intertrochanteric zone (coronal T1 images 13-16). There is associated marrow edema. No avascular necrosis. Marrow edema and focal low T1 signal in the right anterior superior iliac bone, with nonaggressive appearing lesion as a correlate on recent CT. Mild-to-moderate degenerative changes of the SI joints and pubic symphysis. Small benign bone island in the  left acetabulum. Multilevel degenerative changes of the lumbar spine, partially visualized. Articular cartilage and labrum Articular cartilage:  Moderate chondrosis bilaterally. Labrum: Degenerative superior labral tearing most prominent anteriorly. Probable left-sided labral tear as well with adjacent paralabral cyst which measures 2.1 x 1.5 x 2.0 cm Joint or bursal effusion Joint effusion: No significant hip joint effusion. Bursae: No evidence of trochanteric bursitis. Muscles and tendons Muscles and tendons: High-grade tear of the right distal gluteus minimus tendon with retraction. Partial tear of the distal gluteus medius tendon. Extensive associated right gluteus medius edema. Severe right gluteus minimus atrophy. Moderate-severe left gluteus minimus and medius atrophy with chronic high-grade distal left gluteal tendon tears. High-grade partial tear of the left proximal hamstrings. Low-grade partial tear of the right proximal hamstrings.Reactive intramuscular edema in the hip adductors, right greater than left. Delete Other findings Miscellaneous: Distended urinary bladder. Sigmoid diverticulosis. Prior hysterectomy. IMPRESSION: Nondisplaced fracture of the right greater trochanter extending into the intertrochanteric zone with associated marrow edema. High-grade tear of the right distal gluteus minimus tendon with retraction. Partial tear of the distal gluteus medius tendon. Extensive reactive edema in the right gluteus medius muscle. Severe right gluteus minimus atrophy. High-grade partial tear of the left proximal hamstrings. Low-grade partial tear of the right proximal hamstrings. Chronic high-grade tears of the distal left gluteus minimus and medius tendons. Moderate bilateral hip osteoarthritis. Degenerative superior labral tearing most prominent anteriorly. Probable left-sided labral tear as well with adjacent paralabral cyst which measures 2.1 x 1.5 x 2.0 cm. Heterogeneous bone lesion in the right  anterior iliac bone, with non-aggressive appearing lesion as a correlate on recent CT. In the absence of known malignancy this is likely benign. Could consider follow-up pelvis CT in 6 months. Distended urinary bladder, correlate for  urinary retention. Electronically Signed   By: Caprice Renshaw M.D.   On: 03/10/2023 08:59   CT Hip Right Wo Contrast  Result Date: 03/10/2023 CLINICAL DATA:  Fall with right hip pain, fracture suspected. EXAM: CT OF THE RIGHT HIP WITHOUT CONTRAST TECHNIQUE: Multidetector CT imaging of the right hip was performed according to the standard protocol. Multiplanar CT image reconstructions were also generated. RADIATION DOSE REDUCTION: This exam was performed according to the departmental dose-optimization program which includes automated exposure control, adjustment of the mA and/or kV according to patient size and/or use of iterative reconstruction technique. COMPARISON:  03/10/2023. FINDINGS: Bones/Joint/Cartilage A lucency is noted in the anterior aspect of the greater trochanter, possible nondisplaced fracture, best seen on sagittal image 26. The remaining bony structures are intact and no dislocation is seen. Mild-to-moderate degenerative changes are noted at the right hip. Ligaments Suboptimally assessed by CT. Muscles and Tendons Within normal limits. Soft tissues Vascular calcifications present in the soft tissues. Mild subcutaneous fat stranding is noted over the lateral aspect of the right hip, possible contusion. IMPRESSION: 1. Lucency involving the anterior aspect of the greater trochanter, possible nondisplaced fracture versus degenerative changes. Correlation with physical exam for point tenderness is suggested. 2. Mild-to-moderate degenerative changes at the right hip Electronically Signed   By: Thornell Sartorius M.D.   On: 03/10/2023 04:23   CT ABDOMEN PELVIS WO CONTRAST  Result Date: 03/10/2023 CLINICAL DATA:  Abdominal trauma, blunt.  Fall.  Right hip pain. EXAM: CT  ABDOMEN AND PELVIS WITHOUT CONTRAST TECHNIQUE: Multidetector CT imaging of the abdomen and pelvis was performed following the standard protocol without IV contrast. RADIATION DOSE REDUCTION: This exam was performed according to the departmental dose-optimization program which includes automated exposure control, adjustment of the mA and/or kV according to patient size and/or use of iterative reconstruction technique. COMPARISON:  10/02/2022. FINDINGS: Lower chest: The heart is enlarged and coronary artery calcifications are noted. There is a small pericardial effusion. Elevation of the left diaphragm is noted. There is bronchial wall thickening with mucous plugging and patchy airspace disease in the right lower lobe. A 4 mm nodule is noted in the right middle lobe, axial image 12. Hepatobiliary: An ill-defined hypodense lesion and calcifications are present in the left lobe of the liver, hepatic segment 2 measuring 2.2 x 1.6 cm, not significantly changed from the prior exam. No biliary ductal dilatation. A stone is present within the gallbladder. Pancreas: Unremarkable. No pancreatic ductal dilatation or surrounding inflammatory changes. Spleen: The visualized portion of the spleen is within normal limits. The spleen is only partially seen in the field of view. Adrenals/Urinary Tract: The adrenal glands are within normal limits. No renal calculus or hydronephrosis. The bladder is unremarkable. Stomach/Bowel: A small hiatal hernia is present. Stomach is within normal limits. Appendix is not seen. No evidence of bowel wall thickening, distention, or inflammatory changes. No free air or pneumatosis. A moderate amount of retained stool is present in the colon. Scattered diverticula are noted along the sigmoid colon without evidence of diverticulitis. Vascular/Lymphatic: Aortic atherosclerosis. No enlarged abdominal or pelvic lymph nodes. Reproductive: Status post hysterectomy. No adnexal masses. Other: No abdominopelvic  ascites. Musculoskeletal: Degenerative changes are present in the thoracolumbar spine and bilateral hips. No acute fracture is seen. IMPRESSION: 1. No acute fracture. 2. Bronchiectasis with mucous plugging and patchy airspace disease in the right lower lobe, suspicious for pneumonia. 3. 4 mm right lower lobe pulmonary nodule. No follow-up needed if patient is low-risk.This recommendation follows the consensus  statement: Guidelines for Management of Incidental Pulmonary Nodules Detected on CT Images: From the Fleischner Society 2017; Radiology 2017; 284:228-243. 4. Ill-defined hypodense lesion in the liver with calcifications, unchanged from 2023. Multiphase CT may be beneficial for further evaluation on follow-up. 5. Cholelithiasis. 6. Moderate amount of retained stool in the colon suggesting constipation. 7. Aortic atherosclerosis. Electronically Signed   By: Thornell Sartorius M.D.   On: 03/10/2023 04:12   CT HEAD WO CONTRAST ( )  Result Date: 03/10/2023 CLINICAL DATA:  Moderate to severe head trauma EXAM: CT HEAD WITHOUT CONTRAST TECHNIQUE: Contiguous axial images were obtained from the base of the skull through the vertex without intravenous contrast. RADIATION DOSE REDUCTION: This exam was performed according to the departmental dose-optimization program which includes automated exposure control, adjustment of the mA and/or kV according to patient size and/or use of iterative reconstruction technique. COMPARISON:  09/29/2022 FINDINGS: Brain: No evidence of acute infarction, hemorrhage, hydrocephalus, extra-axial collection or mass lesion/mass effect. Extensive patchy low-density in the cerebral white matter, chronic and attributed to chronic small vessel ischemia. Mild for age cerebral volume loss. Vascular: No hyperdense vessel or unexpected calcification. Skull: Normal. Negative for fracture or focal lesion. Sinuses/Orbits: No acute finding. IMPRESSION: No evidence of intracranial injury. Electronically  Signed   By: Tiburcio Pea M.D.   On: 03/10/2023 04:11   DG Hip Unilat W or Wo Pelvis 2-3 Views Right  Result Date: 03/10/2023 CLINICAL DATA:  Fall and trauma to the right hip. EXAM: DG HIP (WITH OR WITHOUT PELVIS) 2-3V RIGHT COMPARISON:  None Available. FINDINGS: There is no acute fracture or dislocation. The bones are osteopenic. Mild bilateral hip arthritic changes. The soft tissues are unremarkable IMPRESSION: No acute fracture or dislocation. Electronically Signed   By: Elgie Collard M.D.   On: 03/10/2023 03:10     Discharge Instructions: Discharge Instructions     Diet - low sodium heart healthy   Complete by: As directed    Discharge instructions   Complete by: As directed    It was a pleasure taking care of you in the hospital! You were admitted for a hip fracture. After discussing treatment options with orthopedic surgery, you decided to manage the fracture without surgery. Since you are needing more help with activities like toileting, standing, and walking, a physical and occupational therapist will come to your facility occasionally. Someone (family member/nurse aide) will also need to stay with you 24/7 in the next couple weeks to make sure all your needs are safely met. You will need to follow-up with orthopedics and your primary care provider. We made the following medication changes:   1. Start taking Vitamin D3 and calcium supplements to help strengthen your bones. 2. Stop taking apixaban (blood thinner), meclizine (vertigo), metoprolol (blood pressure), and verapamil (blood pressure) to help prevent falls.   Increase activity slowly   Complete by: As directed          Discharge Instructions      Orthopedic Surgery Discharge Instructions  Patient name: Nicole [name] Fracture: right greater trochanteric femur fracture with intertrochanteric extension Orthopedist: Willia Craze, MD  Activity: You are allowed to put as much weight on your leg as you would like.  You can walk as much as you would like. You can perform household activities such as cleaning dishes, doing laundry, vacuuming, etc.   Reasons to Call the Office: You should feel free to call the office with any concerns or questions you have in the post-operative period, but you  should definitely notify the office if you develop: -shortness of breath, chest pain, or trouble breathing -new weakness in the right leg, new or worsening numbness or tingling in the right leg -other concerns about your fracture  Follow Up Appointments: You should call the office to schedule an appointment approximately 2-3 weeks after discharge from the hospital. The office phone number and address is listed below.   Office Information:  -Willia Craze, MD -Phone number: [phone] -Address: [address]       [place], [place] [address]     Signed: Lyndle Herrlich, MD 03/12/2023, 3:46 PM   Pager: [phone]

## 2023-03-12 NOTE — Hospital Course (Addendum)
Principal Problem:   Nondisplaced fracture of greater trochanter of right femur (HCC) Active Problems:   Bronchiectasis (HCC)   Chronic diastolic heart failure (HCC)   Hypertension   Closed fracture of right hip (HCC)   #Nondisplaced fracture of R greater trochanter Patient presenting after ground-level mechanical fall. She does not endorse a history of a prodrome proceeding the event and she appears to be neurologically at baseline. Thankfully at this time she does not appear to be in much acute pain. Appreciate orthopedics' assistance with this case. She likely would benefit from a bisphosphonate moving forward, can start this pending decision on potential surgery.  - Appreciate orthopedics assistance - Tylenol 1000mg  three times daily - Oxycodone 5mg  every 4 hours as needed, holding parameters in place - Bowel regimen in place - Consider addition of bisphosphonate - Follow-up vitamin D level - PT/OT   #Productive cough #History of bronchiectasis s/p LLL lobectomy Patient reports having productive cough for the last 10 days. She states she is not overtly short of breath, but intermittently has been more short of breath during this time as well. During my examination patient was intermittently hypoxic, mostly sating in low 90's with occasional reading in upper 80's. She denies any systemic symptoms. Given her symptoms and mild leukocytosis (which could be due to her underlying lung disease and fall), I am going to get imaging to further evaluate. If there is a consolidation present, would be in favor of treating for community-acquired pneumonia.  - Follow-up chest x-ray - Continue home inhalers   #Chronic diastolic heart failure #Hypertension Appears euvolemic. I am unsure of what her current home regimen is. She reports taking a "water pill" but I do not see this listed on our medication list or on recent cardiology notes. She was recently started on a thiazide, however would probably  avoid this moving forward due to her falls. Would consider d/c metoprolol and verapamil at discharge given age and fall risk too. Will continue with lisinopril for now, could add amlodipine as well.  - Continue lisinopril 10mg  daily - Consider addition of amlodipine - Daily BMP   #History of provoked pulmonary embolism Pulmonary embolism was in December 2023 after a few days in the ICU. Patient was not getting prophylactic anticoagulation at the time due to concern for acute blood loss anemia. She has been receiving apixaban since then. Apixaban was discontinued on admission since PE > 3 months ago and will be discontinued at discharge.    #Chronic normocytic anemia  Chronic normocytic anemia noted on admission with hemoglobin 8.3. Anemia labs showed iron 8, saturation ratio 2, B12/folate WNL, which is consistent with iron deficiency anemia. Iron deficit per Loel Ro equation was 766 mg. Patient received IV ferric gluconate 250 mg x 2. Recommend taking iron supplement at home given remaining 250 mg iron deficit.  #Hyperlipidemia Patient takes atorvastatin 20 mg daily at home. Continued during hospitalization and will continue at discharge.   #Hypothyroidism Patient takes levothyroxine 75 mcg daily at home. Continued during hospitalization and will continue at discharge.

## 2023-03-12 NOTE — Discharge Instructions (Signed)
Orthopedic Surgery Discharge Instructions  Patient name: Nicole [name] Fracture: right greater trochanteric femur fracture with intertrochanteric extension Orthopedist: Willia Craze, MD  Activity: You are allowed to put as much weight on your leg as you would like. You can walk as much as you would like. You can perform household activities such as cleaning dishes, doing laundry, vacuuming, etc.   Reasons to Call the Office: You should feel free to call the office with any concerns or questions you have in the post-operative period, but you should definitely notify the office if you develop: -shortness of breath, chest pain, or trouble breathing -new weakness in the right leg, new or worsening numbness or tingling in the right leg -other concerns about your fracture  Follow Up Appointments: You should call the office to schedule an appointment approximately 2-3 weeks after discharge from the hospital. The office phone number and address is listed below.   Office Information:  -Willia Craze, MD -Phone number: [phone] -Address: [address]       [place], [place] [address]

## 2023-03-12 NOTE — TOC Transition Note (Signed)
Transition of Care [facility]) - CM/SW Discharge Note   Patient Details  Name: Nicole [name] MRN: [id number] Date of Birth: 12-19-1934  Transition of Care [facility]) CM/SW Contact:  Carley Hammed, LCSW Phone Number: 03/12/2023, 3:46 PM   Clinical Narrative:    Pt to be transported to [facility] via [facility]. Dtr setting up 24 hour care and [facility].    Final next level of care: Home w Home Health Services Barriers to Discharge: Barriers Resolved   Patient Goals and CMS Choice   Choice offered to / list presented to : Patient  Discharge Placement                [facility] Patient to be transferred to facility by: [facility] Name of family member notified: Kim Patient and family notified of of transfer: 03/12/23  Discharge Plan and Services Additional resources added to the After Visit Summary for       Post Acute Care Choice: Home Health                    HH Arranged: PT, OT [facility] Agency: [facility] ([facility])        Social Determinants of Health (SDOH) Interventions SDOH Screenings   Food Insecurity: No Food Insecurity (03/10/2023)  Housing: Low Risk  (03/10/2023)  Transportation Needs: No Transportation Needs (03/10/2023)  Utilities: Not At Risk (03/10/2023)  Tobacco Use: Low Risk  (03/10/2023)     Readmission Risk Interventions     No data to display

## 2023-03-12 NOTE — TOC CM/SW Note (Signed)
See  SW [facility] Team notes.   Plan for patient to return to Independent Living with home health with [facility] . Nicole [name] with [facility] aware and will need resumption of care orders and face to face , team aware. Daughter arranging 24/7 assistance.   Patient will also need a bedside commode . Bedside commode ordered with Jermain with [employer] .

## 2023-03-12 NOTE — Progress Notes (Addendum)
Orthopedic Surgery Progress Note   Assessment: Patient is a 88 y.o. female with right greater trochanter fracture with intertrochanteric extension   Plan: -Patient did well with PT yesterday and wants to continue with non-operative treatment -No operative intervention planned -PT evaluate and treat -Dispo: per primary -Follow up in office in 2 weeks for repeat XRs   ___________________________________________________________________________  Subjective: No acute events overnight. Was able to mobilize with PT yesterday. Is not having any hip pain this morning. Denies paresthesias and numbness.    Physical Exam:  General: no acute distress, appears stated age Neurologic: alert, answering questions appropriately, following commands Respiratory: unlabored breathing on room air, symmetric chest rise Psychiatric: appropriate affect, normal cadence to speech  MSK:    -Right lower extremity Fires hip flexors, quadriceps, hamstrings, tibialis anterior, gastrocnemius and soleus, extensor hallucis longus Plantarflexes and dorsiflexes toes Sensation intact to light touch in sural, saphenous, tibial, deep peroneal, and superficial peroneal nerve distributions Foot warm and well perfused   Patient name: Nicole [name] Patient MRN: [id number] Date: 03/12/23

## 2023-03-12 NOTE — Progress Notes (Signed)
Occupational Therapy Treatment Patient Details Name: Nicole [name] MRN: [id number] DOB: 29-Jun-1935 Today's Date: 03/12/2023   History of present illness 88 y.o. female who presented from ILF after a ground level fall. Imaging revealed greater trochanter fracture with intertrochanteric extension, per pt's wishes, non-op management with RLE WBAT. PMHx: 2 recent admissions for PNA & AKI (12/23 and 1/24), chronic diastolic HF, hypothyroidism, hypertension, hyperlipidemia   OT comments  Patient with fair progress toward patient focused goals.  Patient moving better, needing generalized supervision for mobility at RW level.  Minimal pain expressed during functional tasks, needing Min A for lower body ADL with increased time.  Patient is hoping to return home with increased assist as needed, but currently, patient will benefit from continued inpatient follow up therapy, <3 hours/day.  OT will continue efforts in the acute setting to address deficits listed.      Recommendations for follow up therapy are one component of a multi-disciplinary discharge planning process, led by the attending physician.  Recommendations may be updated based on patient status, additional functional criteria and insurance authorization.    Assistance Recommended at Discharge Frequent or constant Supervision/Assistance  Patient can return home with the following  A little help with walking and/or transfers;A lot of help with bathing/dressing/bathroom;Assistance with cooking/housework;Assist for transportation;Help with stairs or ramp for entrance;Direct supervision/assist for financial management;Direct supervision/assist for medications management   Equipment Recommendations  None recommended by OT    Recommendations for Other Services      Precautions / Restrictions Precautions Precautions: Fall Restrictions Weight Bearing Restrictions: Yes RLE Weight Bearing: Weight bearing as tolerated       Mobility Bed  Mobility Overal bed mobility: Needs Assistance Bed Mobility: Supine to Sit     Supine to sit: Supervision          Transfers Overall transfer level: Needs assistance Equipment used: Rolling walker (2 wheels) Transfers: Sit to/from Stand, Bed to chair/wheelchair/BSC Sit to Stand: Supervision     Step pivot transfers: Supervision           Balance Overall balance assessment: Needs assistance Sitting-balance support: Feet supported Sitting balance-Leahy Scale: Good     Standing balance support: Reliant on assistive device for balance Standing balance-Leahy Scale: Poor                             ADL either performed or assessed with clinical judgement   ADL                   Upper Body Dressing : Set up;Sitting   Lower Body Dressing: Sit to/from stand;Minimal assistance   Toilet Transfer: Ambulation;Rolling walker (2 wheels);Supervision/safety                  Extremity/Trunk Assessment Upper Extremity Assessment Upper Extremity Assessment: Overall WFL for tasks assessed   Lower Extremity Assessment Lower Extremity Assessment: Defer to PT evaluation   Cervical / Trunk Assessment Cervical / Trunk Assessment: Kyphotic    Vision Patient Visual Report: No change from baseline     Perception Perception Perception: Not tested   Praxis Praxis Praxis: Not tested    Cognition Arousal/Alertness: Awake/alert Behavior During Therapy: WFL for tasks assessed/performed Overall Cognitive Status: Within Functional Limits for tasks assessed  General Comments  VSS on RA    Pertinent Vitals/ Pain       Pain Assessment Pain Assessment: Faces Faces Pain Scale: Hurts a little bit Pain Location: R hip Pain Descriptors / Indicators: Nagging Pain Intervention(s): Monitored during session                                                           Frequency  Min 2X/week        Progress Toward Goals  OT Goals(current goals can now be found in the care plan section)  Progress towards OT goals: Progressing toward goals  Acute Rehab OT Goals OT Goal Formulation: With patient Time For Goal Achievement: 03/25/23 Potential to Achieve Goals: Good  Plan Discharge plan remains appropriate    Co-evaluation                 AM-PAC OT "6 Clicks" Daily Activity     Outcome Measure   Help from another person eating meals?: None Help from another person taking care of personal grooming?: A Little Help from another person toileting, which includes using toliet, bedpan, or urinal?: A Little Help from another person bathing (including washing, rinsing, drying)?: A Little Help from another person to put on and taking off regular upper body clothing?: None Help from another person to put on and taking off regular lower body clothing?: A Little 6 Click Score: 20    End of Session Equipment Utilized During Treatment: Gait belt;Rolling walker (2 wheels)  OT Visit Diagnosis: Unsteadiness on feet (R26.81);Other abnormalities of gait and mobility (R26.89);Repeated falls (R29.6);Muscle weakness (generalized) (M62.81);History of falling (Z91.81);Pain Pain - Right/Left: Right Pain - part of body: Hip;Leg   Activity Tolerance Patient tolerated treatment well   Patient Left in chair;with call bell/phone within reach;with chair alarm set   Nurse Communication Mobility status        Time: 1610-9604 OT Time Calculation (min): 25 min  Charges: OT General Charges $OT Visit: 1 Visit OT Treatments $Self Care/Home Management : 23-37 mins  03/12/2023  RP, OTR/L  Acute Rehabilitation Services  Office:  [phone]   Nicole [name] 03/12/2023, 11:52 AM

## 2023-03-12 NOTE — TOC Progression Note (Addendum)
Transition of Care [facility]) - Progression Note    Patient Details  Name: Nicole [name] MRN: [id number] Date of Birth: 28-May-1935  [facility] CM/SW Contact  Carley Hammed, LCSW Phone Number: 03/12/2023, 11:28 AM  Clinical Narrative:    CSW noted that pt had been moved inpatient, so would qualify for SNF. CSW met with pt at bedside to discuss SNF again. Pt continuing to decline SNF. CSW offered education and recommendations. Pt states she will be fine and wants to go home w/ HH. Pt asked for CSW to update dtr, CSW left VM. TOC will continue to follow for DC needs.   CSW spoke with dtr who noted she is setting up 24 hour care and can transport pt this afternoon if discharged. They are requesting a bedside commode. CSW updated the medical team and will continue to follow.   Expected Discharge Plan: Home w Home Health Services Barriers to Discharge: Continued Medical Work up  Expected Discharge Plan and Services     Post Acute Care Choice: Home Health Living arrangements for the past 2 months: Independent Living Facility                                       Social Determinants of Health (SDOH) Interventions SDOH Screenings   Food Insecurity: No Food Insecurity (03/10/2023)  Housing: Low Risk  (03/10/2023)  Transportation Needs: No Transportation Needs (03/10/2023)  Utilities: Not At Risk (03/10/2023)  Tobacco Use: Low Risk  (03/10/2023)    Readmission Risk Interventions     No data to display

## 2023-03-13 NOTE — TOC CM/SW Note (Signed)
REceived a message, patient left her bedside commode/3 in 1 at discharge. She does not want it.   NCM called Jermaine with [employer] to come pick it up from [employer] nurses station

## 2023-03-27 ENCOUNTER — Ambulatory Visit (INDEPENDENT_AMBULATORY_CARE_PROVIDER_SITE_OTHER): Payer: Medicare Other | Admitting: Orthopedic Surgery

## 2023-03-27 ENCOUNTER — Other Ambulatory Visit (INDEPENDENT_AMBULATORY_CARE_PROVIDER_SITE_OTHER): Payer: Medicare Other

## 2023-03-27 DIAGNOSIS — S72114A Nondisplaced fracture of greater trochanter of right femur, initial encounter for closed fracture: Secondary | ICD-10-CM

## 2023-03-27 NOTE — Progress Notes (Signed)
Orthopedic Surgery Progress Note     Assessment: Patient is a 88 y.o. female with right greater trochanter fracture with intertrochanteric extension.  No interval displacement seen since weightbearing for the last couple of weeks     Plan: -Patient has been ambulating and her hip pain has been improving.  She wants to continue with nonoperative treatment, so she can remain weightbearing as tolerated bilateral lower extremities.  She should use a walker to prevent further falls. -Follow up on an as-needed basis.  Explained that if her hip pain gets worse or if she has a new injury   ___________________________________________________________________________   Subjective: Has been doing well since last time I saw her in the hospital.  She has noted her hip pain has improved significantly.  She is ambulating with a walker.  She has not had any more falls.  She is only taking Tylenol for pain.  Denies paresthesias and numbness.     Physical Exam:   General: no acute distress, appears stated age Neurologic: alert, answering questions appropriately, following commands Respiratory: unlabored breathing on room air, symmetric chest rise Psychiatric: appropriate affect, normal cadence to speech   MSK:      -Right lower extremity  No pain with logroll, ambulates with walker Fires hip flexors, quadriceps, hamstrings, tibialis anterior, gastrocnemius and soleus, extensor hallucis longus Plantarflexes and dorsiflexes toes Sensation intact to light touch in sural, saphenous, tibial, deep peroneal, and superficial peroneal nerve distributions Foot warm and well perfused    XR of the right hip from 03/27/2023 were independently reviewed and interpreted.  Known greater trochanteric fracture with intertrochanteric extension is not seen.  Alignment is similar to injury films on 03/10/2023.  No dislocation seen.   Patient name: Nicole [name] Patient MRN: [id number] Date: 03/27/23

## 2023-03-31 ENCOUNTER — Encounter: Payer: Self-pay | Admitting: Family Medicine

## 2023-03-31 ENCOUNTER — Ambulatory Visit (INDEPENDENT_AMBULATORY_CARE_PROVIDER_SITE_OTHER): Payer: Medicare Other | Admitting: Family Medicine

## 2023-03-31 VITALS — BP 120/60 | HR 82 | Temp 98.0°F | Ht 60.0 in | Wt 91.2 lb

## 2023-03-31 DIAGNOSIS — E039 Hypothyroidism, unspecified: Secondary | ICD-10-CM | POA: Diagnosis not present

## 2023-03-31 DIAGNOSIS — M8000XA Age-related osteoporosis with current pathological fracture, unspecified site, initial encounter for fracture: Secondary | ICD-10-CM | POA: Diagnosis not present

## 2023-03-31 DIAGNOSIS — E782 Mixed hyperlipidemia: Secondary | ICD-10-CM

## 2023-03-31 DIAGNOSIS — J479 Bronchiectasis, uncomplicated: Secondary | ICD-10-CM

## 2023-03-31 DIAGNOSIS — S72114D Nondisplaced fracture of greater trochanter of right femur, subsequent encounter for closed fracture with routine healing: Secondary | ICD-10-CM | POA: Diagnosis not present

## 2023-03-31 DIAGNOSIS — I10 Essential (primary) hypertension: Secondary | ICD-10-CM

## 2023-03-31 LAB — TSH: TSH: 2.76 u[IU]/mL (ref 0.35–5.50)

## 2023-03-31 MED ORDER — MONTELUKAST SODIUM 10 MG PO TABS: 10.0000 mg | ORAL_TABLET | Freq: Every day | ORAL | 1 refills | Status: DC

## 2023-03-31 MED ORDER — ALENDRONATE SODIUM 70 MG PO TABS
70.0000 mg | ORAL_TABLET | ORAL | 11 refills | Status: AC
Start: 2023-03-31 — End: ?

## 2023-03-31 MED ORDER — ATORVASTATIN CALCIUM 20 MG PO TABS: 20.0000 mg | ORAL_TABLET | Freq: Every day | ORAL | 1 refills | Status: DC

## 2023-03-31 MED ORDER — FLUTICASONE-SALMETEROL 250-50 MCG/ACT IN AEPB: 1.0000 | INHALATION_SPRAY | Freq: Two times a day (BID) | RESPIRATORY_TRACT | 3 refills | Status: DC

## 2023-03-31 MED ORDER — LISINOPRIL 5 MG PO TABS
5.0000 mg | ORAL_TABLET | Freq: Every day | ORAL | 1 refills | Status: DC
Start: 2023-03-31 — End: 2023-05-18

## 2023-03-31 MED ORDER — INCRUSE ELLIPTA 62.5 MCG/ACT IN AEPB
1.0000 | INHALATION_SPRAY | Freq: Every day | RESPIRATORY_TRACT | 11 refills | Status: DC
Start: 2023-03-31 — End: 2023-04-13

## 2023-03-31 MED ORDER — ALBUTEROL SULFATE HFA 108 (90 BASE) MCG/ACT IN AERS
2.0000 | INHALATION_SPRAY | RESPIRATORY_TRACT | 5 refills | Status: AC | PRN
Start: 2023-03-31 — End: ?

## 2023-03-31 MED ORDER — LEVOTHYROXINE SODIUM 75 MCG PO TABS: 75.0000 ug | ORAL_TABLET | Freq: Every day | ORAL | 1 refills | Status: DC

## 2023-03-31 NOTE — Assessment & Plan Note (Signed)
BP normal on medication listed below, will refill her lisinopril

## 2023-03-31 NOTE — Assessment & Plan Note (Signed)
Last month, s/p hospitalization, she is continuing with therapy, is ambulating well with a walker, will start fosamax 70 mg weekly

## 2023-03-31 NOTE — Assessment & Plan Note (Signed)
Last TSH was 10.6 in January, will order new TSH and adjust her dose if needed

## 2023-03-31 NOTE — Assessment & Plan Note (Addendum)
Breathing at baseline, currently she is stable on the medications listed below, will continue these as prescribed. She is following with a lung doctor, we discussed the pros/cons of getting another CT chest, I advised her to think about it and if she agrees to call have reschedule the test.

## 2023-03-31 NOTE — Progress Notes (Signed)
New Patient Office Visit  Subjective    Patient ID: Nicole [name], female    DOB: December 10, 1934  Age: 88 y.o. MRN: [id number]  CC:  Chief Complaint  Patient presents with   Establish Care   Medication Refill    Patient requests a refill on     HPI Nicole [name] presents to establish care Patinet is here today with her daughter. States that her previous PCP closed his office so she had to move PCP's. She was recently in the hospital for a right hip fracture last month. They opted for non-operative management. She followed up with orthpedist Dr. Christell Constant who reports that the fracture is healing. Patient lives at [facility] and is getting home health and nursing aide there. Patient states that she does have balance problems and falls frequently. Patient wears shoes and non skid socks regularly at the [facility]. I have reviewed the discharge summary and labs from this hospital stay. She was started on vitamin D supplement and multivitamin. She was taken off the eliquis, verapamil, metoprolol and meclizine.   There is a note of osteporosis on the discharge summary, we discussed starting treatment now that she is >2 weeks out from her fracture and she is agreeable.   Patient has a past medical history of bronchiectasis, sees Dr. Jerre Simon for this, had ordered a repeat CT of the chest however she didn't have it done due to the hospital admission for the hip fracture. Pt reports that her breathing is at her baseline functioning. She has a pulse oximeter that she uses to check her oxygen levels at home. She is on 2 inhalers plus albuterol. Not on oxygen at home. States that her breathing has always been an issue since she was a young person.   Pt already has a cardiologist, Dr. Baltazar Apo. Is going back to see him on June 24th for ECHO and CBC. She reports no chest pain, no swelling in her legs, she is urinating well.   Anemia-- appears to be iron deficiency from the labs she had done in the hospital. She is  currently on iron for this.   Hypothyroid-- chronic, ongoing, last TSH was 10.6, patient has not had any dosage changes, still on 75 mcg of levothyroxine, we discussed getting a new TSH and she is agreeable. She does report cold intolerance. Is taking senokot for her bowels to prevent constipation.   Current Outpatient Medications  Medication Instructions   acetaminophen (TYLENOL) 325 mg, Oral, Daily PRN   albuterol (VENTOLIN HFA) 108 (90 Base) MCG/ACT inhaler 2 puffs, Inhalation, Every 4 hours PRN   alendronate (FOSAMAX) 70 mg, Oral, Every 7 days, Take with a full glass of water on an empty stomach.   atorvastatin (LIPITOR) 20 mg, Oral, Daily   fluticasone-salmeterol (ADVAIR) 250-50 MCG/ACT AEPB 1 puff, Inhalation, 2 times daily   HYDROCHLOROTHIAZIDE PO 12.5 mg, Oral, Every 3 days<BR>   INCRUSE ELLIPTA 62.5 MCG/ACT AEPB 1 puff, Inhalation, Daily   latanoprost (XALATAN) 0.005 % ophthalmic solution 1 drop, Both Eyes, Daily at bedtime   levocetirizine (XYZAL) 5 mg, Oral, Daily PRN   levothyroxine (SYNTHROID) 75 mcg, Oral, Daily   lisinopril (ZESTRIL) 5 mg, Oral, Daily   montelukast (SINGULAIR) 10 mg, Oral, Daily   Multiple Vitamin (MULTIVITAMIN WITH MINERALS) TABS tablet 1 tablet, Oral, Daily   polyethylene glycol (MIRALAX / GLYCOLAX) 17 g, Oral, Daily   senna-docusate (SENOKOT-S) 8.6-50 MG tablet 1 tablet, Oral, Daily at bedtime   vitamin D3 (CHOLECALCIFEROL) 1,000 Units,  Oral, Daily    Past Medical History:  Diagnosis Date   Allergies    Asthma    Bronchiectasis (HCC) 07/16/2022   Chronic diastolic heart failure (HCC) 03/10/2023   Chronic obstructive lung disease (HCC) 03/24/2022   Glaucoma    History of pulmonary embolus (PE) 11/18/2022   Hyperlipidemia    Hypertension 03/20/2022    Past Surgical History:  Procedure Laterality Date   ABDOMINAL HYSTERECTOMY     1980   LAMINECTOMY     LOBECTOMY     1970    Family History  Problem Relation Age of Onset   Diabetes Mother     Heart disease Father    COPD Father    Heart attack Father    Cancer Sister     Social History   Socioeconomic History   Marital status: Widowed    Spouse name: Not on file   Number of children: Not on file   Years of education: Not on file   Highest education level: Not on file  Occupational History   Not on file  Tobacco Use   Smoking status: Never   Smokeless tobacco: Never  Vaping Use   Vaping Use: Never used  Substance and Sexual Activity   Alcohol use: Not Currently   Drug use: Never   Sexual activity: Not Currently  Other Topics Concern   Not on file  Social History Narrative   Not on file   Social Determinants of Health   Financial Resource Strain: Not on file  Food Insecurity: No Food Insecurity (03/10/2023)   Hunger Vital Sign    Worried About Running Out of Food in the Last Year: Never true    Ran Out of Food in the Last Year: Never true  Transportation Needs: No Transportation Needs (03/10/2023)   PRAPARE - Administrator, Civil Service (Medical): No    Lack of Transportation (Non-Medical): No  Physical Activity: Not on file  Stress: Not on file  Social Connections: Not on file  Intimate Partner Violence: Not At Risk (03/10/2023)   Humiliation, Afraid, Rape, and Kick questionnaire    Fear of Current or Ex-Partner: No    Emotionally Abused: No    Physically Abused: No    Sexually Abused: No    Review of Systems  All other systems reviewed and are negative.       Objective    BP 120/60 (BP Location: Left Arm, Patient Position: Sitting, Cuff Size: Normal)   Pulse 82   Temp 98 F (36.7 C) (Oral)   Ht 5' (1.524 m)   Wt 91 lb 3.2 oz (41.4 kg)   SpO2 98%   BMI 17.81 kg/m   Physical Exam Vitals reviewed.  Constitutional:      Appearance: Normal appearance. She is well-groomed and underweight.  Eyes:     Conjunctiva/sclera: Conjunctivae normal.  Neck:     Thyroid: No thyromegaly.  Cardiovascular:     Rate and Rhythm: Normal  rate and regular rhythm.     Pulses: Normal pulses.     Heart sounds: S1 normal and S2 normal.  Pulmonary:     Effort: Pulmonary effort is normal.     Breath sounds: Normal air entry. Rhonchi present. No wheezing or rales.  Abdominal:     General: Bowel sounds are normal.  Musculoskeletal:     Right lower leg: Edema (mild ankle edema due to right hip fracture likely) present.     Left lower leg: No edema.  Neurological:     Mental Status: She is alert and oriented to person, place, and time. Mental status is at baseline.     Gait: Gait is intact.  Psychiatric:        Mood and Affect: Mood and affect normal.        Speech: Speech normal.        Behavior: Behavior normal.        Judgment: Judgment normal.         Assessment & Plan:  Primary hypertension Assessment & Plan: BP normal on medication listed below, will refill her lisinopril  Orders: -     Lisinopril; Take 1 tablet (5 mg total) by mouth daily.  Dispense: 90 tablet; Refill: 1  Acquired hypothyroidism Assessment & Plan: Last TSH was 10.6 in January, will order new TSH and adjust her dose if needed  Orders: -     Levothyroxine Sodium; Take 1 tablet (75 mcg total) by mouth daily.  Dispense: 90 tablet; Refill: 1 -     TSH  Closed nondisplaced fracture of greater trochanter of right femur with routine healing, subsequent encounter Assessment & Plan: Last month, s/p hospitalization, she is continuing with therapy, is ambulating well with a walker, will start fosamax 70 mg weekly    Age-related osteoporosis with current pathological fracture, initial encounter -     Alendronate Sodium; Take 1 tablet (70 mg total) by mouth every 7 (seven) days. Take with a full glass of water on an empty stomach.  Dispense: 4 tablet; Refill: 11  Mixed hyperlipidemia -     Atorvastatin Calcium; Take 1 tablet (20 mg total) by mouth daily.  Dispense: 90 tablet; Refill: 1  Bronchiectasis without complication [facility]) Assessment &  Plan: Breathing at baseline, currently she is stable on the medications listed below, will continue these as prescribed. She is following with a lung doctor, we discussed the pros/cons of getting another CT chest, I advised her to think about it and if she agrees to call have reschedule the test.   Orders: -     Montelukast Sodium; Take 1 tablet (10 mg total) by mouth daily.  Dispense: 90 tablet; Refill: 1 -     Fluticasone-Salmeterol; Inhale 1 puff into the lungs 2 (two) times daily.  Dispense: 180 each; Refill: 3 -     Albuterol Sulfate HFA; Inhale 2 puffs into the lungs every 4 (four) hours as needed for wheezing or shortness of breath.  Dispense: 8 g; Refill: 5 -     Incruse Ellipta; Inhale 1 puff into the lungs daily.  Dispense: 30 each; Refill: 11   I spent 45 minutes with the patient reviewing documentation from the hospital, labs ,imaging and ortho and pulmonary's notes. Also reviewed Dr .Van Clines notes, she is going for ECHO at the end of the month.  Return in about 3 months (around 07/01/2023) for follow up on anemia.   Karie Georges, MD

## 2023-04-07 ENCOUNTER — Telehealth: Payer: Self-pay | Admitting: Family Medicine

## 2023-04-07 MED ORDER — VITAMIN D3 25 MCG PO TABS: 1000.0000 [IU] | ORAL_TABLET | Freq: Every day | ORAL | 0 refills | Status: DC

## 2023-04-07 NOTE — Telephone Encounter (Signed)
Prescription Request  04/07/2023  LOV: 03/31/2023  What is the name of the medication or equipment?  vitamin D3 (CHOLECALCIFEROL) 25 MCG tablet   Have you contacted your pharmacy to request a refill? No   Which pharmacy would you like this sent to?  [facility] - [place], [place] - [address] [place] [address] Phone: [phone] Fax: [phone]    Patient notified that their request is being sent to the clinical staff for review and that they should receive a response within 2 business days.   Please advise at Mobile [phone] (mobile)

## 2023-04-07 NOTE — Telephone Encounter (Signed)
Refill sent.

## 2023-04-07 NOTE — Telephone Encounter (Signed)
Ok to refill 

## 2023-04-13 ENCOUNTER — Other Ambulatory Visit: Payer: Self-pay | Admitting: *Deleted

## 2023-04-13 DIAGNOSIS — J479 Bronchiectasis, uncomplicated: Secondary | ICD-10-CM

## 2023-04-13 DIAGNOSIS — E782 Mixed hyperlipidemia: Secondary | ICD-10-CM

## 2023-04-13 MED ORDER — INCRUSE ELLIPTA 62.5 MCG/ACT IN AEPB
1.0000 | INHALATION_SPRAY | Freq: Every day | RESPIRATORY_TRACT | 3 refills | Status: DC
Start: 2023-04-13 — End: 2023-04-28

## 2023-04-13 MED ORDER — ATORVASTATIN CALCIUM 20 MG PO TABS
20.0000 mg | ORAL_TABLET | Freq: Every day | ORAL | 1 refills | Status: DC
Start: 2023-04-13 — End: 2023-08-18

## 2023-04-13 NOTE — Telephone Encounter (Signed)
Rx done. 

## 2023-04-14 ENCOUNTER — Telehealth: Payer: Self-pay | Admitting: Family Medicine

## 2023-04-14 NOTE — Telephone Encounter (Signed)
Spoke with the patient's daughter for more information.  She stated the patient was seen for a regular follow up on 6/24 with Dr Boneta Lucks, cardiologist, who ordered blood work,  a chest x-ray and echo.  Stated the patient complains of a productive cough for the past month.  I advised Ms Yetta Barre, the patient would need a visit here for evaluation as antibiotics are not sent in without a visit.  She was advised PCP does not have any openings and a visit was scheduled with Dr [place] on 6/25 at 11:30am.  She stated the patient only wants to see PCP and was advised Dr Casimiro Needle does not have any openings soon and in order to provide good care for our patients, we will often schedule with another provider in a few days if possible or advise patients seek treatment at an urgent care if needed. Ms Yetta Barre stated she "will just sit on this as she knows how her mother is" and requested the appt be cancelled, will discuss this with the patient and may call back.

## 2023-04-14 NOTE — Telephone Encounter (Signed)
Bronchiectasis has flared back up, WBC count is climbing, says cardiologist suggested she reach out to her PCP and get an antibiotic since he is not following her for that. Wants to keep it from turning into pneumonia.

## 2023-04-15 ENCOUNTER — Ambulatory Visit: Payer: Medicare Other | Admitting: Family Medicine

## 2023-04-27 ENCOUNTER — Telehealth: Payer: Self-pay | Admitting: Family Medicine

## 2023-04-27 MED ORDER — VITAMIN D3 25 MCG PO TABS: 1000.0000 [IU] | ORAL_TABLET | Freq: Every day | ORAL | 5 refills | Status: DC

## 2023-04-27 NOTE — Telephone Encounter (Signed)
Rx done. 

## 2023-04-27 NOTE — Telephone Encounter (Signed)
Prescription Request  04/27/2023  LOV: 03/31/2023  What is the name of the medication or equipment? vitamin D3 (CHOLECALCIFEROL) 25 MCG tablet   Have you contacted your pharmacy to request a refill? No   Which pharmacy would you like this sent to?  [facility] - [place], [place] - [address] [place] [address] Phone: [phone] Fax: [phone]     Patient notified that their request is being sent to the clinical staff for review and that they should receive a response within 2 business days.   Please advise at Mobile [phone] (mobile)

## 2023-04-28 ENCOUNTER — Other Ambulatory Visit: Payer: Self-pay | Admitting: *Deleted

## 2023-04-28 DIAGNOSIS — J479 Bronchiectasis, uncomplicated: Secondary | ICD-10-CM

## 2023-04-28 MED ORDER — INCRUSE ELLIPTA 62.5 MCG/ACT IN AEPB
1.00 | INHALATION_SPRAY | Freq: Every day | RESPIRATORY_TRACT | 1 refills | Status: AC
Start: 2023-04-28 — End: ?

## 2023-04-28 NOTE — Telephone Encounter (Signed)
Rx done. 

## 2023-04-29 ENCOUNTER — Telehealth: Payer: Self-pay | Admitting: Family Medicine

## 2023-04-29 NOTE — Telephone Encounter (Signed)
Sure no problem

## 2023-04-29 NOTE — Telephone Encounter (Signed)
Pt's daughter Selena Batten) called to say Pt may have over-used her INCRUSE ELLIPTA 62.5 MCG/ACT AEPB  (1 puff daily, but Pt may have been taking 2)  Pt is now completely out of this Rx.   [facility] has informed them that Pt should receive this Rx by 05/07/23.  Daughter would like to know if it would be ok for Pt to use her old Spiriva, temporarily, until the Incruse Ellipta arrives?  Please advise.

## 2023-04-29 NOTE — Telephone Encounter (Signed)
Spoke with the patient's daughter and informed her of the message below.  She was also informed refills for vitamin D was sent on 7/8.

## 2023-04-30 ENCOUNTER — Other Ambulatory Visit: Payer: Self-pay | Admitting: *Deleted

## 2023-04-30 DIAGNOSIS — J479 Bronchiectasis, uncomplicated: Secondary | ICD-10-CM

## 2023-04-30 DIAGNOSIS — M8000XA Age-related osteoporosis with current pathological fracture, unspecified site, initial encounter for fracture: Secondary | ICD-10-CM

## 2023-04-30 MED ORDER — ALBUTEROL SULFATE HFA 108 (90 BASE) MCG/ACT IN AERS
2.0000 | INHALATION_SPRAY | RESPIRATORY_TRACT | 1 refills | Status: DC | PRN
Start: 2023-04-30 — End: 2023-07-15

## 2023-04-30 MED ORDER — ALENDRONATE SODIUM 70 MG PO TABS
70.0000 mg | ORAL_TABLET | ORAL | 1 refills | Status: DC
Start: 2023-04-30 — End: 2023-08-18

## 2023-04-30 NOTE — Addendum Note (Signed)
Addended by: Johnella Moloney on: 04/30/2023 03:23 PM   Modules accepted: Orders

## 2023-04-30 NOTE — Telephone Encounter (Signed)
Rx done. 

## 2023-05-11 ENCOUNTER — Telehealth: Payer: Self-pay | Admitting: *Deleted

## 2023-05-11 NOTE — Telephone Encounter (Signed)
[facility] faxed a refill request for Levocetirizine 5mg -take 1 tablet daily-#90.  Message sent to PCP.

## 2023-05-12 MED ORDER — LEVOCETIRIZINE DIHYDROCHLORIDE 5 MG PO TABS: 5.0000 mg | ORAL_TABLET | Freq: Every evening | ORAL | 0 refills | Status: DC

## 2023-05-12 NOTE — Telephone Encounter (Signed)
Ok to refill 

## 2023-05-12 NOTE — Telephone Encounter (Signed)
Rx done. 

## 2023-05-18 ENCOUNTER — Other Ambulatory Visit: Payer: Self-pay | Admitting: *Deleted

## 2023-05-18 DIAGNOSIS — I10 Essential (primary) hypertension: Secondary | ICD-10-CM

## 2023-05-18 DIAGNOSIS — E039 Hypothyroidism, unspecified: Secondary | ICD-10-CM

## 2023-05-18 MED ORDER — LISINOPRIL 5 MG PO TABS
5.0000 mg | ORAL_TABLET | Freq: Every day | ORAL | 0 refills | Status: AC
Start: 2023-05-18 — End: 2023-06-17

## 2023-05-18 MED ORDER — LEVOTHYROXINE SODIUM 75 MCG PO TABS
75.0000 ug | ORAL_TABLET | Freq: Every day | ORAL | 0 refills | Status: AC
Start: 2023-05-18 — End: ?

## 2023-05-18 NOTE — Telephone Encounter (Signed)
Fax received from [facility] for Latanoprost 0.05-place one drop into both eyes at bedtime-2.76ml.  Message sent to PCP as Rx was not previously prescribed by this office.

## 2023-05-26 ENCOUNTER — Telehealth: Payer: Self-pay | Admitting: *Deleted

## 2023-05-26 NOTE — Telephone Encounter (Signed)
[facility] faxed a refill request for Latanoprost ophthalmic solution 2.46ml-0.005%-#3.  Message sent to PCP.

## 2023-05-27 MED ORDER — LATANOPROST 0.005 % OP SOLN: 1.0000 [drp] | Freq: Every day | OPHTHALMIC | 0 refills | Status: DC

## 2023-05-27 NOTE — Telephone Encounter (Signed)
Ok to refill 

## 2023-05-27 NOTE — Telephone Encounter (Signed)
Rx done. 

## 2023-07-15 ENCOUNTER — Other Ambulatory Visit: Payer: Self-pay | Admitting: Family Medicine

## 2023-07-15 DIAGNOSIS — J479 Bronchiectasis, uncomplicated: Secondary | ICD-10-CM

## 2023-07-20 ENCOUNTER — Ambulatory Visit: Payer: Medicare Other | Admitting: Family Medicine

## 2023-07-21 ENCOUNTER — Encounter: Payer: Self-pay | Admitting: Family Medicine

## 2023-07-21 ENCOUNTER — Ambulatory Visit (INDEPENDENT_AMBULATORY_CARE_PROVIDER_SITE_OTHER): Payer: Medicare Other | Admitting: Family Medicine

## 2023-07-21 VITALS — BP 92/50 | HR 88 | Temp 97.9°F | Ht 60.0 in | Wt 86.2 lb

## 2023-07-21 DIAGNOSIS — J479 Bronchiectasis, uncomplicated: Secondary | ICD-10-CM

## 2023-07-21 DIAGNOSIS — M720 Palmar fascial fibromatosis [Dupuytren]: Secondary | ICD-10-CM | POA: Diagnosis not present

## 2023-07-21 DIAGNOSIS — I10 Essential (primary) hypertension: Secondary | ICD-10-CM

## 2023-07-21 DIAGNOSIS — E039 Hypothyroidism, unspecified: Secondary | ICD-10-CM | POA: Diagnosis not present

## 2023-07-21 MED ORDER — LEVOTHYROXINE SODIUM 75 MCG PO TABS
75.0000 ug | ORAL_TABLET | Freq: Every day | ORAL | 1 refills | Status: DC
Start: 2023-07-21 — End: 2023-07-21

## 2023-07-21 MED ORDER — LISINOPRIL 5 MG PO TABS
5.0000 mg | ORAL_TABLET | Freq: Every day | ORAL | 1 refills | Status: DC
Start: 2023-07-21 — End: 2024-01-19

## 2023-07-21 MED ORDER — ALBUTEROL SULFATE HFA 108 (90 BASE) MCG/ACT IN AERS
2.0000 | INHALATION_SPRAY | Freq: Four times a day (QID) | RESPIRATORY_TRACT | 5 refills | Status: DC | PRN
Start: 2023-07-21 — End: 2024-01-23

## 2023-07-21 MED ORDER — LEVOTHYROXINE SODIUM 75 MCG PO TABS: 75.0000 ug | ORAL_TABLET | Freq: Every day | ORAL | 1 refills | Status: DC

## 2023-07-21 MED ORDER — INCRUSE ELLIPTA 62.5 MCG/ACT IN AEPB
1.0000 | INHALATION_SPRAY | Freq: Every day | RESPIRATORY_TRACT | 5 refills | Status: DC
Start: 2023-07-21 — End: 2024-01-23

## 2023-07-21 MED ORDER — LISINOPRIL 5 MG PO TABS
5.0000 mg | ORAL_TABLET | Freq: Every day | ORAL | 1 refills | Status: DC
Start: 2023-07-21 — End: 2023-07-21

## 2023-07-21 NOTE — Progress Notes (Signed)
Established Patient Office Visit  Subjective   Patient ID: Nicole [name], female    DOB: November 16, 1934  Age: 88 y.o. MRN: [id number]  Chief Complaint  Patient presents with   Medical Management of Chronic Issues   Hand Pain    Patient complains of left hand pain x3 weeks, difficulty opening bottles, etc., history of Dupuytren's contracture    Pt is here for follow up today. Patient today is complaining that she has having severe hand pain on the left, now moving to the right. She has severe arthritis in her hands oand is having difficulty with her grip strength. She reports that often her fingers will "get stuck" and hse is unable to open her hand. Reports she cannot hold on to things, that they just fall out of her hand.   Daughter is present in the visit. She reports that she has had this problem for years. Seems to be getting somewhat worse over time.   Pt needs her prescriptions sent to the mail order pharmacy for refills.     Current Outpatient Medications  Medication Instructions   acetaminophen (TYLENOL) 325 mg, Oral, Daily PRN   albuterol (VENTOLIN HFA) 108 (90 Base) MCG/ACT inhaler 2 puffs, Inhalation, Every 6 hours PRN   alendronate (FOSAMAX) 70 mg, Oral, Every 7 days, Take with a full glass of water on an empty stomach.   atorvastatin (LIPITOR) 20 mg, Oral, Daily   fluticasone-salmeterol (ADVAIR) 250-50 MCG/ACT AEPB 1 puff, Inhalation, 2 times daily   HYDROCHLOROTHIAZIDE PO 12.5 mg, Oral, Every 3 days<BR>   INCRUSE ELLIPTA 62.5 MCG/ACT AEPB 1 puff, Inhalation, Daily   latanoprost (XALATAN) 0.005 % ophthalmic solution 1 drop, Both Eyes, Daily at bedtime   levocetirizine (XYZAL) 5 mg, Oral, Every evening   levothyroxine (SYNTHROID) 75 mcg, Oral, Daily   lisinopril (ZESTRIL) 5 mg, Oral, Daily   montelukast (SINGULAIR) 10 mg, Oral, Daily   Multiple Vitamin (MULTIVITAMIN WITH MINERALS) TABS tablet 1 tablet, Oral, Daily   polyethylene glycol (MIRALAX / GLYCOLAX) 17 g, Oral,  Daily   senna-docusate (SENOKOT-S) 8.6-50 MG tablet 1 tablet, Oral, Daily at bedtime   vitamin D3 (CHOLECALCIFEROL) 1,000 Units, Oral, Daily    Patient Active Problem List   Diagnosis Date Noted   Dupuytren contracture of both hands 07/21/2023   Age-related osteoporosis with current pathological fracture 03/31/2023   Mixed hyperlipidemia 03/31/2023   Nondisplaced fracture of greater trochanter of femur (HCC) 03/12/2023   Bronchiectasis without acute exacerbation (HCC) 03/12/2023   Nondisplaced fracture of greater trochanter of right femur (HCC) 03/10/2023   Chronic diastolic heart failure (HCC) 03/10/2023   Nondisplaced intertrochanteric fracture of right femur, initial encounter for closed fracture (HCC) 03/10/2023   History of pulmonary embolus (PE) 11/18/2022   Malnutrition of moderate degree 10/01/2022   Hyponatremia 09/30/2022   Bradycardia 09/29/2022   Hematemesis 09/29/2022   Hyperkalemia 09/29/2022   Advance care planning 09/29/2022   Bronchiectasis (HCC) 07/16/2022   Hypothyroidism 07/16/2022   Chronic obstructive lung disease (HCC) 03/24/2022   Hypertension 03/20/2022      Review of Systems  All other systems reviewed and are negative.     Objective:     BP (!) 92/50 (BP Location: Right Arm, Patient Position: Sitting, Cuff Size: Normal)   Pulse 88   Temp 97.9 F (36.6 C) (Oral)   Ht 5' (1.524 m)   Wt 86 lb 3.2 oz (39.1 kg)   SpO2 94%   BMI 16.83 kg/m    Physical Exam Vitals reviewed.  Constitutional:      Appearance: Normal appearance. She is well-groomed and normal weight.  Neck:     Thyroid: No thyromegaly.  Cardiovascular:     Rate and Rhythm: Normal rate and regular rhythm.     Pulses: Normal pulses.     Heart sounds: S1 normal and S2 normal.  Pulmonary:     Effort: Pulmonary effort is normal.     Breath sounds: Normal breath sounds and air entry.  Musculoskeletal:     Right hand: Deformity present. Decreased range of motion.     Left hand:  Deformity present. Decreased range of motion.     Right lower leg: No edema.     Left lower leg: No edema.     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     Mental Status: She is alert. Mental status is at baseline.     Gait: Gait is intact.  Psychiatric:        Mood and Affect: Mood and affect normal.        Speech: Speech normal.        Behavior: Behavior normal.        Judgment: Judgment normal.      No results found for any visits on 07/21/23.    The ASCVD Risk score (Arnett DK, et al., 2019) failed to calculate for the following reasons:   The 2019 ASCVD risk score is only valid for ages 8 to 76    Assessment & Plan:  Dupuytren contracture of both hands Assessment & Plan: Options were discussed, pt agreed to see the hand specialist for further recommendations, referral placed. Pt might benefit from occupational therapy to improve her grip strength, will wait to see what the specialist says.   Orders: -     Ambulatory referral to Hand Surgery  Acquired hypothyroidism -     Levothyroxine Sodium; Take 1 tablet (75 mcg total) by mouth daily.  Dispense: 90 tablet; Refill: 1  Primary hypertension -     Lisinopril; Take 1 tablet (5 mg total) by mouth daily.  Dispense: 90 tablet; Refill: 1  Bronchiectasis without complication (HCC) -     Incruse Ellipta; Inhale 1 puff into the lungs daily.  Dispense: 30 each; Refill: 5 -     Albuterol Sulfate HFA; Inhale 2 puffs into the lungs every 6 (six) hours as needed for wheezing or shortness of breath.  Dispense: 18 g; Refill: 5     Return in about 6 months (around 01/19/2024).    Karie Georges, MD

## 2023-07-21 NOTE — Assessment & Plan Note (Signed)
Options were discussed, pt agreed to see the hand specialist for further recommendations, referral placed. Pt might benefit from occupational therapy to improve her grip strength, will wait to see what the specialist says.

## 2023-07-23 ENCOUNTER — Other Ambulatory Visit: Payer: Self-pay | Admitting: Family Medicine

## 2023-07-31 ENCOUNTER — Telehealth: Payer: Self-pay | Admitting: Family Medicine

## 2023-07-31 NOTE — Telephone Encounter (Signed)
Pt daughter kim is calling with update dr [facility]e [facility] does not think pt has dupuytren contracture but has osteoarthritis. Pt has PT sch for 08-10-2023. Pt  wrist is swollen and pt has never taken advil in her life per daughter and she will give her advil every 6 hrs. Please advise

## 2023-08-03 NOTE — Telephone Encounter (Signed)
Spoke with [name] and informed her of the message below.

## 2023-08-03 NOTE — Telephone Encounter (Signed)
Yes advil is fine-- I reviewed her medications and I do not see a reason to not use the advil. She may take 600-800 mg every 8 hours as needed.

## 2023-08-04 ENCOUNTER — Telehealth: Payer: Self-pay | Admitting: Family Medicine

## 2023-08-04 DIAGNOSIS — J479 Bronchiectasis, uncomplicated: Secondary | ICD-10-CM

## 2023-08-04 MED ORDER — MONTELUKAST SODIUM 10 MG PO TABS
10.0000 mg | ORAL_TABLET | Freq: Every day | ORAL | 1 refills | Status: DC
Start: 2023-08-04 — End: 2023-12-21

## 2023-08-04 NOTE — Telephone Encounter (Signed)
Rx done. 

## 2023-08-04 NOTE — Telephone Encounter (Signed)
Prescription Request  08/04/2023  LOV: 07/21/2023 montelukast (SINGULAIR) 10 MG tablet   Have you contacted your pharmacy to request a refill? No   Which pharmacy would you like this sent to?  [facility] - [place], [place] - [address] Phone: [phone]  Fax: [phone]     Patient notified that their request is being sent to the clinical staff for review and that they should receive a response within 2 business days.   Please advise at Mobile [phone] (mobile)

## 2023-08-12 MED ORDER — BENZONATATE 100 MG PO CAPS
100.0000 mg | ORAL_CAPSULE | Freq: Two times a day (BID) | ORAL | 1 refills | Status: DC | PRN
Start: 2023-08-12 — End: 2024-01-23

## 2023-08-12 NOTE — Addendum Note (Signed)
Addended by: Karie Georges on: 08/12/2023 02:09 PM   Modules accepted: Orders

## 2023-08-12 NOTE — Telephone Encounter (Signed)
Sure, which pharmacy does she want the script sent to?

## 2023-08-12 NOTE — Telephone Encounter (Addendum)
Pt was seen via [facility] on 08/04/23. Pt was prescribed Paxlovid, but is also taking Mucinex and Montelukast. Pt was feeling better, but the cough has returned.  Daughter is asking if MD would be willing to send Rx for something stronger like Tessalon Perles or something else, to help get this cough under control?  Please advise.

## 2023-08-12 NOTE — Telephone Encounter (Signed)
Pt's daughter called and states she would like script sent to :  [id number] - [place], [place] - [address] Phone: [phone]  Fax: [phone]

## 2023-08-12 NOTE — Telephone Encounter (Signed)
Left a message requesting Ms Yetta Barre call back with pharmacy information as requested below.

## 2023-08-18 ENCOUNTER — Encounter: Payer: Self-pay | Admitting: Family Medicine

## 2023-08-18 ENCOUNTER — Ambulatory Visit (INDEPENDENT_AMBULATORY_CARE_PROVIDER_SITE_OTHER): Payer: Medicare Other | Admitting: Family Medicine

## 2023-08-18 VITALS — BP 142/78 | HR 78 | Temp 98.2°F | Ht 60.0 in | Wt 86.5 lb

## 2023-08-18 DIAGNOSIS — J479 Bronchiectasis, uncomplicated: Secondary | ICD-10-CM | POA: Diagnosis not present

## 2023-08-18 DIAGNOSIS — E782 Mixed hyperlipidemia: Secondary | ICD-10-CM | POA: Diagnosis not present

## 2023-08-18 DIAGNOSIS — U071 COVID-19: Secondary | ICD-10-CM | POA: Diagnosis not present

## 2023-08-18 MED ORDER — ALBUTEROL SULFATE (2.5 MG/3ML) 0.083% IN NEBU: 2.5000 mg | INHALATION_SOLUTION | Freq: Four times a day (QID) | RESPIRATORY_TRACT | 0 refills | Status: DC | PRN

## 2023-08-18 MED ORDER — ATORVASTATIN CALCIUM 20 MG PO TABS: 20.0000 mg | ORAL_TABLET | Freq: Every day | ORAL | 1 refills | Status: DC

## 2023-08-18 MED ORDER — LEVOCETIRIZINE DIHYDROCHLORIDE 5 MG PO TABS: 5.0000 mg | ORAL_TABLET | Freq: Every evening | ORAL | 1 refills | Status: DC

## 2023-08-18 MED ORDER — ALBUTEROL SULFATE (2.5 MG/3ML) 0.083% IN NEBU: 2.5000 mg | INHALATION_SOLUTION | Freq: Four times a day (QID) | RESPIRATORY_TRACT | 5 refills | Status: DC | PRN

## 2023-08-18 NOTE — Progress Notes (Signed)
.  h 

## 2023-08-18 NOTE — Progress Notes (Signed)
Established Patient Office Visit  Subjective   Patient ID: Nicole [name], female    DOB: Oct 06, 1935  Age: 88 y.o. MRN: [id number]  Chief Complaint  Patient presents with   Hospitalization Follow-up    Pt is here for hospital follow up. She was admitted with COVID to the hospital on 10/14 to 10/17. She was treated with Paxlovid and monitored in the hospital. She did have an elevated troponin in the hospital, likely secondary to LV strain. She already has a cardiologist who is managing her mod-severe AS. She was taking the tessalon perles at home but felt that the coughing was worse so she stopped taking it. States that she did get rebound symptoms after finishing the paxlovid.   Today she still has some coughing and mucus production. States that she feels like there is still a rattle, still feels very fatigued and somewhat weak. She denies any SOB currently. Pt does have underlying lung disease/ bronchiectasis.     Current Outpatient Medications  Medication Instructions   acetaminophen (TYLENOL) 325 mg, Oral, Daily PRN   albuterol (PROVENTIL) 2.5 mg, Nebulization, Every 6 hours PRN   albuterol (VENTOLIN HFA) 108 (90 Base) MCG/ACT inhaler 2 puffs, Inhalation, Every 6 hours PRN   atorvastatin (LIPITOR) 20 mg, Oral, Daily   benzonatate (TESSALON) 100 mg, Oral, 2 times daily PRN   fluticasone-salmeterol (ADVAIR) 250-50 MCG/ACT AEPB 1 puff, Inhalation, 2 times daily   HYDROCHLOROTHIAZIDE PO 12.5 mg, Oral, Every 3 days<BR>   INCRUSE ELLIPTA 62.5 MCG/ACT AEPB 1 puff, Inhalation, Daily   latanoprost (XALATAN) 0.005 % ophthalmic solution 1 drop, Both Eyes, Daily at bedtime   levocetirizine (XYZAL) 5 mg, Oral, Every evening   levothyroxine (SYNTHROID) 75 mcg, Oral, Daily   lisinopril (ZESTRIL) 5 mg, Oral, Daily   montelukast (SINGULAIR) 10 mg, Oral, Daily   Multiple Vitamin (MULTIVITAMIN WITH MINERALS) TABS tablet 1 tablet, Oral, Daily   senna-docusate (SENOKOT-S) 8.6-50 MG tablet 1 tablet,  Oral, Daily at bedtime   vitamin D3 (CHOLECALCIFEROL) 1,000 Units, Oral, Daily    Patient Active Problem List   Diagnosis Date Noted   Dupuytren contracture of both hands 07/21/2023   Age-related osteoporosis with current pathological fracture 03/31/2023   Mixed hyperlipidemia 03/31/2023   Nondisplaced fracture of greater trochanter of femur (HCC) 03/12/2023   Bronchiectasis without acute exacerbation (HCC) 03/12/2023   Nondisplaced fracture of greater trochanter of right femur (HCC) 03/10/2023   Chronic diastolic heart failure (HCC) 03/10/2023   Nondisplaced intertrochanteric fracture of right femur, initial encounter for closed fracture (HCC) 03/10/2023   History of pulmonary embolus (PE) 11/18/2022   Malnutrition of moderate degree 10/01/2022   Hyponatremia 09/30/2022   Bradycardia 09/29/2022   Hematemesis 09/29/2022   Hyperkalemia 09/29/2022   Advance care planning 09/29/2022   Bronchiectasis (HCC) 07/16/2022   Hypothyroidism 07/16/2022   Chronic obstructive lung disease (HCC) 03/24/2022   Hypertension 03/20/2022      Review of Systems  All other systems reviewed and are negative.     Objective:     BP (!) 142/78 (BP Location: Left Arm, Patient Position: Sitting, Cuff Size: Normal)   Pulse 78   Temp 98.2 F (36.8 C) (Oral)   Ht 5' (1.524 m)   Wt 86 lb 8 oz (39.2 kg)   SpO2 94%   BMI 16.89 kg/m    Physical Exam Vitals reviewed.  Constitutional:      Appearance: Normal appearance. She is underweight.  HENT:     Mouth/Throat:  Mouth: Mucous membranes are moist.  Eyes:     Conjunctiva/sclera: Conjunctivae normal.  Cardiovascular:     Rate and Rhythm: Normal rate and regular rhythm.     Pulses: Normal pulses.     Heart sounds: No murmur heard. Pulmonary:     Effort: Pulmonary effort is normal.     Breath sounds: Rhonchi (extensive upper respiratory mucus/ rattling) present.  Neurological:     General: No focal deficit present.     Mental Status: She  is alert. Mental status is at baseline.  Psychiatric:        Mood and Affect: Mood normal.        Behavior: Behavior normal.      No results found for any visits on 08/18/23.    The ASCVD Risk score (Arnett DK, et al., 2019) failed to calculate for the following reasons:   The 2019 ASCVD risk score is only valid for ages 75 to 40    Assessment & Plan:  COVID-19 S/p Hospitalization, she is recovering well considering her pre-existing lung disease. I recommended getting a nebulizer machine for her to use during times of illness. I have placed orders for this. Recommended she continue Mucinex expectorant to help thin the secretions. I also encouraged her to restart her physical therapy to help with the weakness.   Bronchiectasis without complication (HCC) -     Levocetirizine Dihydrochloride; Take 1 tablet (5 mg total) by mouth every evening.  Dispense: 90 tablet; Refill: 1 -     For home use only DME Other see comment -     Albuterol Sulfate; Take 3 mLs (2.5 mg total) by nebulization every 6 (six) hours as needed for wheezing or shortness of breath.  Dispense: 120 mL; Refill: 5  Mixed hyperlipidemia -     Atorvastatin Calcium; Take 1 tablet (20 mg total) by mouth daily.  Dispense: 90 tablet; Refill: 1     No follow-ups on file.    Karie Georges, MD

## 2023-08-20 ENCOUNTER — Telehealth: Payer: Self-pay | Admitting: Family Medicine

## 2023-08-20 ENCOUNTER — Telehealth: Payer: Self-pay | Admitting: *Deleted

## 2023-08-20 DIAGNOSIS — J479 Bronchiectasis, uncomplicated: Secondary | ICD-10-CM

## 2023-08-20 NOTE — Telephone Encounter (Signed)
Daughter called to F/U on the Nebulizer she was told MD would order for Pt.  Please return call.

## 2023-08-20 NOTE — Telephone Encounter (Signed)
Per PCP new order for nebulizer was entered per [employer]'s request.

## 2023-08-21 NOTE — Telephone Encounter (Signed)
Left a detailed message at the patient's daughter's cell number stating someone from [employer] should be contacting the patient with more information regarding the delivery of the nebulizer.  Another community message was sent today and message was left stating I will contact her once further information is received.

## 2023-08-24 NOTE — Telephone Encounter (Signed)
Pt daughter kim said she would just buy nebulizer for dove and to please send albuterol (PROVENTIL) (2.5 MG/3ML) 0.083% nebulizer solution  to local pharm  [id number] - [place], [place] - [address] AT [place] [address] Phone: [phone]  Fax: [phone]

## 2023-08-25 MED ORDER — ALBUTEROL SULFATE (2.5 MG/3ML) 0.083% IN NEBU: 2.5000 mg | INHALATION_SOLUTION | Freq: Four times a day (QID) | RESPIRATORY_TRACT | 5 refills | Status: DC | PRN

## 2023-08-25 NOTE — Telephone Encounter (Signed)
Script already in computer -- ok to [facility] [place]

## 2023-08-25 NOTE — Addendum Note (Signed)
Addended by: Johnella Moloney on: 08/25/2023 08:17 AM   Modules accepted: Orders

## 2023-08-25 NOTE — Telephone Encounter (Signed)
Spoke with the patient's daughter and informed her the [facility] was [facility] as requested.  She stated the Rx came from [facility] and was advised to let [facility] know she does not want the Rx that was sent.

## 2023-08-27 ENCOUNTER — Telehealth: Payer: Self-pay | Admitting: Family Medicine

## 2023-08-27 DIAGNOSIS — J479 Bronchiectasis, uncomplicated: Secondary | ICD-10-CM

## 2023-08-27 DIAGNOSIS — R531 Weakness: Secondary | ICD-10-CM

## 2023-08-27 NOTE — Telephone Encounter (Signed)
Spoke with the patient's daughter and informed her the referral to home health was placed as below.

## 2023-08-27 NOTE — Telephone Encounter (Signed)
Ok to place referral.

## 2023-08-27 NOTE — Telephone Encounter (Signed)
Pt saw dr Casimiro Needle about a week ago for a Hospital f/u.  Pt needs a new referral for pt due to being too week right out of the hospital.  Hospital dr referred for pt after hospital stay.  It has been long enough that she needs to have that new referral.    Home Health:  [facility]  Pt is at [facility] in [place]

## 2023-09-14 ENCOUNTER — Telehealth: Payer: Self-pay | Admitting: Family Medicine

## 2023-09-14 NOTE — Telephone Encounter (Signed)
I do not think it needs to be continued

## 2023-09-14 NOTE — Telephone Encounter (Signed)
Pt's daughter Selena Batten called and stated that her pt was seen at the hospital about a month ago for Covid. The ER doctor put her on 1 baby aspirin a day, which pt has been taking, but is now out of. They would like to know if she needs to continue to take the baby aspirin or not. Requesting a call back: [phone]

## 2023-09-15 NOTE — Telephone Encounter (Signed)
Spoke with the patient and informed her of the message below. Patient stated she still has a productive cough, appt offered and the patient declined.

## 2023-09-24 ENCOUNTER — Other Ambulatory Visit: Payer: Self-pay | Admitting: Family Medicine

## 2023-10-06 ENCOUNTER — Telehealth: Payer: Self-pay | Admitting: Family Medicine

## 2023-10-06 NOTE — Telephone Encounter (Addendum)
FYI:   Daughter called to inform MD that Pt had a fall last week and was taken to UC.  Pt has a fractured wrist - will F/U with Ortho in 3 weeks.  Will also call Cardiologist, to F/U as well, because Pt is supposed to take her Hydrochlorothiazide PRN, but may not be taking it more than once a week  (Very swollen ankles).

## 2023-10-17 ENCOUNTER — Other Ambulatory Visit: Payer: Self-pay

## 2023-10-17 ENCOUNTER — Emergency Department (HOSPITAL_COMMUNITY): Payer: Medicare Other

## 2023-10-17 ENCOUNTER — Encounter (HOSPITAL_COMMUNITY): Payer: Self-pay | Admitting: Emergency Medicine

## 2023-10-17 ENCOUNTER — Emergency Department (HOSPITAL_COMMUNITY)
Admission: EM | Admit: 2023-10-17 | Discharge: 2023-10-18 | Disposition: A | Discharge status: Home / Self Care | Payer: Medicare Other | Attending: Emergency Medicine | Admitting: Emergency Medicine

## 2023-10-17 DIAGNOSIS — I110 Hypertensive heart disease with heart failure: Secondary | ICD-10-CM | POA: Diagnosis not present

## 2023-10-17 DIAGNOSIS — I4510 Unspecified right bundle-branch block: Secondary | ICD-10-CM | POA: Diagnosis not present

## 2023-10-17 DIAGNOSIS — S99922A Unspecified injury of left foot, initial encounter: Secondary | ICD-10-CM | POA: Diagnosis present

## 2023-10-17 DIAGNOSIS — I493 Ventricular premature depolarization: Secondary | ICD-10-CM | POA: Diagnosis not present

## 2023-10-17 DIAGNOSIS — W010XXA Fall on same level from slipping, tripping and stumbling without subsequent striking against object, initial encounter: Secondary | ICD-10-CM | POA: Insufficient documentation

## 2023-10-17 DIAGNOSIS — I5032 Chronic diastolic (congestive) heart failure: Secondary | ICD-10-CM | POA: Diagnosis not present

## 2023-10-17 DIAGNOSIS — M25511 Pain in right shoulder: Secondary | ICD-10-CM | POA: Diagnosis not present

## 2023-10-17 DIAGNOSIS — R600 Localized edema: Secondary | ICD-10-CM | POA: Insufficient documentation

## 2023-10-17 DIAGNOSIS — R0609 Other forms of dyspnea: Secondary | ICD-10-CM | POA: Insufficient documentation

## 2023-10-17 DIAGNOSIS — S9032XA Contusion of left foot, initial encounter: Secondary | ICD-10-CM | POA: Diagnosis not present

## 2023-10-17 MED ORDER — ACETAMINOPHEN 500 MG PO TABS
500.0000 mg | ORAL_TABLET | Freq: Once | ORAL | Status: AC
  Administered 2023-10-17: 500 mg via ORAL
  Filled 2023-10-17: qty 1

## 2023-10-17 NOTE — ED Notes (Signed)
Pt to Xray.

## 2023-10-17 NOTE — ED Notes (Signed)
Pt is alert and oriented today, remembering fall.  Denies hitting her head or any LOC.  Pt c/o left heal pain and right shoulder pain from a fall two weeks ago.  Denies pain unless she moves her foot or shoulder.

## 2023-10-17 NOTE — ED Notes (Addendum)
Pt unable to stand on left foot due to pain.  RN placed pt back in bed and placed Ice under foot.  Pt expressed fears of falling again.  Pt unable to tolerate surgical shoe.    Nadine Counts (son-in-law) was on phone has some concerns and would like provider to call.  She has not been taking her lasix b/c of fear of falling when going to the bathroom.  [phone].

## 2023-10-17 NOTE — ED Provider Notes (Signed)
EMERGENCY DEPARTMENT AT [facility] Provider Note   CSN: [id number] Arrival date & time: 10/17/23  1950     History  Chief Complaint  Patient presents with   Nicole [name] is a 88 y.o. female.  HPI 88 year old female presents with a fall and left heel pain. She slipped and fell on her heel. No back/head injury. No neck injury.  A week or 2 ago she also fell and injured her left wrist which is currently in a splint.  She states for the past week or so she has been dealing with right shoulder pain though does not remember specifically injuring it but is painful to move.  No chest pain or shortness of breath.  Home Medications Prior to Admission medications   Medication Sig Start Date End Date Taking? Authorizing Provider  acetaminophen (TYLENOL) 325 MG tablet Take 325 mg by mouth daily as needed for moderate pain or headache.    [provider]  albuterol (PROVENTIL) (2.5 MG/3ML) 0.083% nebulizer solution Take 3 mLs (2.5 mg total) by nebulization every 6 (six) hours as needed for wheezing or shortness of breath. 08/25/23   Karie Georges, MD  albuterol (VENTOLIN HFA) 108 (90 Base) MCG/ACT inhaler Inhale 2 puffs into the lungs every 6 (six) hours as needed for wheezing or shortness of breath. 07/21/23   Karie Georges, MD  atorvastatin (LIPITOR) 20 MG tablet Take 1 tablet (20 mg total) by mouth daily. 08/18/23   Karie Georges, MD  benzonatate (TESSALON) 100 MG capsule Take 1 capsule (100 mg total) by mouth 2 (two) times daily as needed for cough. 08/12/23   Karie Georges, MD  cholecalciferol (VITAMIN D3) 25 MCG (1000 UNIT) tablet Take 1 tablet (1,000 Units total) by mouth daily. 09/24/23   Karie Georges, MD  fluticasone-salmeterol (ADVAIR) 250-50 MCG/ACT AEPB Inhale 1 puff into the lungs 2 (two) times daily. 03/31/23   Karie Georges, MD  HYDROCHLOROTHIAZIDE PO Take 12.5 mg by mouth. Every 3 days    [provider]   INCRUSE ELLIPTA 62.5 MCG/ACT AEPB Inhale 1 puff into the lungs daily. 07/21/23   Karie Georges, MD  latanoprost (XALATAN) 0.005 % ophthalmic solution Place 1 drop into both eyes at bedtime. 05/27/23   Karie Georges, MD  levocetirizine (XYZAL) 5 MG tablet Take 1 tablet (5 mg total) by mouth every evening. 08/18/23   Karie Georges, MD  levothyroxine (SYNTHROID) 75 MCG tablet Take 1 tablet (75 mcg total) by mouth daily. 07/21/23   Karie Georges, MD  lisinopril (ZESTRIL) 5 MG tablet Take 1 tablet (5 mg total) by mouth daily. 07/21/23 08/20/23  Karie Georges, MD  montelukast (SINGULAIR) 10 MG tablet Take 1 tablet (10 mg total) by mouth daily. 08/04/23   Karie Georges, MD  Multiple Vitamin (MULTIVITAMIN WITH MINERALS) TABS tablet Take 1 tablet by mouth daily. 10/30/22   Sheikh, Omair Latif, DO  senna-docusate (SENOKOT-S) 8.6-50 MG tablet Take 1 tablet by mouth at bedtime. 10/30/22   Marguerita Merles Latif, DO      Allergies    Codeine and Penicillins    Review of Systems   Review of Systems  Cardiovascular:  Negative for chest pain.  Musculoskeletal:  Positive for arthralgias. Negative for neck pain.    Physical Exam Updated Vital Signs BP (!) 141/93 (BP Location: Left Arm)   Pulse 81   Temp 97.6 F (36.4 C) (Temporal)  Resp 16   Ht 5' (1.524 m)   Wt 39.2 kg   SpO2 98%   BMI 16.88 kg/m  Physical Exam Vitals and nursing note reviewed.  Constitutional:      Appearance: She is well-developed.  HENT:     Head: Normocephalic and atraumatic.  Cardiovascular:     Rate and Rhythm: Normal rate and regular rhythm.     Pulses:          Radial pulses are 2+ on the right side.       Dorsalis pedis pulses are 2+ on the left side.     Heart sounds: Normal heart sounds.  Pulmonary:     Effort: Pulmonary effort is normal.  Abdominal:     Palpations: Abdomen is soft.     Tenderness: There is no abdominal tenderness.  Musculoskeletal:     Right shoulder: Tenderness  present. Decreased range of motion.     Right upper arm: No tenderness.     Right elbow: Normal range of motion. No tenderness.     Right wrist: No tenderness. Normal range of motion.     Left ankle: No tenderness.     Left Achilles Tendon: No tenderness or defects.     Left foot: Tenderness (over heel) present.     Comments: Left wrist is in a removable splint.  Skin:    General: Skin is warm and dry.  Neurological:     Mental Status: She is alert.     ED Results / Procedures / Treatments   Labs (all labs ordered are listed, but only abnormal results are displayed) Labs Reviewed  COMPREHENSIVE METABOLIC PANEL  CBC WITH DIFFERENTIAL/PLATELET  BRAIN NATRIURETIC PEPTIDE    EKG EKG Interpretation Date/Time:  Saturday October 17 2023 22:32:07 EST Ventricular Rate:  89 PR Interval:  152 QRS Duration:  130 QT Interval:  396 QTC Calculation: 481 R Axis:   81  Text Interpretation: Sinus rhythm with frequent Premature ventricular complexes Right bundle branch block Poor data quality in current ECG precludes serial comparison Confirmed by Pricilla Loveless [phone]) on 10/17/2023 10:52:39 PM  Radiology DG Chest 1 View Result Date: 10/17/2023 CLINICAL DATA:  These results were communicated via [email or web address] application on 10/17/2023 at 10:54 pm. EXAM: CHEST  1 VIEW COMPARISON:  Chest x-ray 03/10/2023 FINDINGS: The heart and mediastinal contours are unchanged. Atherosclerotic plaque. Question developing right lung airspace opacity. Bibasilar scarring. Chronic coarsened interstitial markings with no overt pulmonary edema. No pleural effusion. No pneumothorax. No acute osseous abnormality. IMPRESSION: 1. Question developing right lung airspace opacity. Recommend repeat PA and lateral view of the chest for further evaluation. 2.  Aortic Atherosclerosis (ICD10-I70.0). Electronically Signed   By: Tish Frederickson M.D.   On: 10/17/2023 23:04   DG Os Calcis Left Result Date: 10/17/2023 CLINICAL DATA:   fall EXAM: LEFT OS CALCIS - 2+ VIEW COMPARISON:  X-ray left foot 10/17/2023 FINDINGS: Sclerosis of the posterior calcaneus-possible old healed fracture. Plantar calcaneal spur. There is no evidence of fracture or other focal bone lesions. Soft tissues are unremarkable. Vascular calcification IMPRESSION: No acute displaced fracture or dislocation. Electronically Signed   By: Tish Frederickson M.D.   On: 10/17/2023 21:32   DG Foot Complete Left Result Date: 10/17/2023 CLINICAL DATA:  fall EXAM: LEFT FOOT - COMPLETE 3+ VIEW COMPARISON:  None Available. FINDINGS: Age-indeterminate, likely acute to subacute, nondisplaced first digit proximal phalanx fracture. Question age indeterminate nondisplaced first digit distal phalanx fracture. Midfoot moderate degenerative changes. Mild interphalangeal joint degenerative  changes. Nonspecific sclerosis of the distal calcaneus. Plantar calcaneal spur. Soft tissues are unremarkable. IMPRESSION: Age-indeterminate, likely acute to subacute, nondisplaced first digit proximal phalanx fracture. Question age-indeterminate nondisplaced first digit distal phalanx fracture. Correlate with point tenderness to palpation to evaluate for acute fracture. Electronically Signed   By: Tish Frederickson M.D.   On: 10/17/2023 21:31   DG Ankle Complete Left Result Date: 10/17/2023 CLINICAL DATA:  fall EXAM: LEFT ANKLE COMPLETE - 3+ VIEW COMPARISON:  None Available. FINDINGS: There is no evidence of fracture, dislocation, or joint effusion. Degenerative changes of the talar neck with likely old nonunionized healed fracture. Healed milli 0 malleolar fracture. There is no evidence of arthropathy or other focal bone abnormality. Soft tissues are unremarkable. Vascular calcifications. IMPRESSION: No acute displaced fracture or dislocation. Electronically Signed   By: Tish Frederickson M.D.   On: 10/17/2023 21:28   DG Shoulder Right Result Date: 10/17/2023 CLINICAL DATA:  fall EXAM: RIGHT SHOULDER - 2+  VIEW COMPARISON:  X-ray right shoulder 10/01/2022 FINDINGS: There is no evidence of fracture or dislocation. Severe degenerative changes of the glenohumeral joint. Soft tissues are unremarkable. IMPRESSION: No acute displaced fracture or dislocation. Electronically Signed   By: Tish Frederickson M.D.   On: 10/17/2023 21:27    Procedures Procedures    Medications Ordered in ED Medications  acetaminophen (TYLENOL) tablet 500 mg (500 mg Oral Given 10/17/23 2128)    ED Course/ Medical Decision Making/ A&P Clinical Course as of 10/17/23 2320  Sat Oct 17, 2023  2226 Patient is very fearful of getting up due to the pain it causes in her heel.  There is no fracture on the x-ray.  She did not want to put on the postop shoe because it caused more pain.  She also is complaining of acute on chronic leg swelling.  She has chronic dyspnea.  Will add on some labs and get a CT of her foot.  She is worried that she might not be able to take care of herself at home because she does not think she can walk due to this.  However she also does not want stronger pain medicine at this time.  I tried to call the son-in-law who had previously called but he did not answer. [SG]    Clinical Course User Index [SG] Pricilla Loveless, MD                                 Medical Decision Making Amount and/or Complexity of Data Reviewed Labs: ordered. Radiology: ordered.    Details: No fractures on xray.  Risk OTC drugs.   No clear fracture.  Does have some pitting edema to her legs which she states is a little worse.  Will obtain labs and get a CT of her foot as she was having a hard time standing up, mostly due to fear of the pain it would cause her heel.  Will have to have reassessment after this workup.  Care transferred to Dr. Madilyn Hook.        Final Clinical Impression(s) / ED Diagnoses Final diagnoses:  None    Rx / DC Orders ED Discharge Orders     None         Pricilla Loveless, MD 10/17/23  2320

## 2023-10-17 NOTE — ED Triage Notes (Signed)
Per [facility], pt leaned over and fell, "eased herself down"  did not hit head, no blood thinners.  Only complaint is left heal is hurting.  Did fall two weeks ago, right shoulder pain returned and has left wrist brace.    148/92 Pulse 90  93% RA

## 2023-10-17 NOTE — ED Notes (Signed)
Provider bedside.

## 2023-10-18 DIAGNOSIS — S9032XA Contusion of left foot, initial encounter: Secondary | ICD-10-CM | POA: Diagnosis not present

## 2023-10-18 LAB — CBC WITH DIFFERENTIAL/PLATELET
Abs Immature Granulocytes: 0.06 10*3/uL (ref 0.00–0.07)
Basophils Absolute: 0.1 10*3/uL (ref 0.0–0.1)
Basophils Relative: 0 %
Eosinophils Absolute: 0.2 10*3/uL (ref 0.0–0.5)
Eosinophils Relative: 2 %
HCT: 27.5 % — ABNORMAL LOW (ref 36.0–46.0)
Hemoglobin: 8.9 g/dL — ABNORMAL LOW (ref 12.0–15.0)
Immature Granulocytes: 1 %
Lymphocytes Relative: 10 %
Lymphs Abs: 1.2 10*3/uL (ref 0.7–4.0)
MCH: 28.6 pg (ref 26.0–34.0)
MCHC: 32.4 g/dL (ref 30.0–36.0)
MCV: 88.4 fL (ref 80.0–100.0)
Monocytes Absolute: 0.7 10*3/uL (ref 0.1–1.0)
Monocytes Relative: 6 %
Neutro Abs: 9.6 10*3/uL — ABNORMAL HIGH (ref 1.7–7.7)
Neutrophils Relative %: 81 %
Platelets: 361 10*3/uL (ref 150–400)
RBC: 3.11 MIL/uL — ABNORMAL LOW (ref 3.87–5.11)
RDW: 14.3 % (ref 11.5–15.5)
WBC: 11.7 10*3/uL — ABNORMAL HIGH (ref 4.0–10.5)
nRBC: 0 % (ref 0.0–0.2)

## 2023-10-18 LAB — URINALYSIS, W/ REFLEX TO CULTURE (INFECTION SUSPECTED)
Bacteria, UA: NONE SEEN
Bilirubin Urine: NEGATIVE
Glucose, UA: NEGATIVE mg/dL
Hgb urine dipstick: NEGATIVE
Ketones, ur: NEGATIVE mg/dL
Nitrite: NEGATIVE
Protein, ur: NEGATIVE mg/dL
Specific Gravity, Urine: 1.008 (ref 1.005–1.030)
pH: 7 (ref 5.0–8.0)

## 2023-10-18 LAB — COMPREHENSIVE METABOLIC PANEL
ALT: 21 U/L (ref 0–44)
AST: 21 U/L (ref 15–41)
Albumin: 2.7 g/dL — ABNORMAL LOW (ref 3.5–5.0)
Alkaline Phosphatase: 96 U/L (ref 38–126)
Anion gap: 11 (ref 5–15)
BUN: 10 mg/dL (ref 8–23)
CO2: 28 mmol/L (ref 22–32)
Calcium: 8.8 mg/dL — ABNORMAL LOW (ref 8.9–10.3)
Chloride: 103 mmol/L (ref 98–111)
Creatinine, Ser: 0.53 mg/dL (ref 0.44–1.00)
GFR, Estimated: >60 mL/min (ref >60)
Glucose, Bld: 91 mg/dL (ref 70–99)
Potassium: 3.2 mmol/L — ABNORMAL LOW (ref 3.5–5.1)
Sodium: 142 mmol/L (ref 135–145)
Total Bilirubin: 0.7 mg/dL (ref <1.2)
Total Protein: 6.9 g/dL (ref 6.5–8.1)

## 2023-10-18 LAB — BRAIN NATRIURETIC PEPTIDE: B Natriuretic Peptide: 514.9 pg/mL — ABNORMAL HIGH (ref 0.0–100.0)

## 2023-10-18 MED ORDER — ACETAMINOPHEN 325 MG PO TABS
650.0000 mg | ORAL_TABLET | ORAL | Status: DC | PRN
  Administered 2023-10-18: 650 mg via ORAL
  Filled 2023-10-18: qty 2

## 2023-10-18 MED ORDER — POTASSIUM CHLORIDE CRYS ER 20 MEQ PO TBCR
20.0000 meq | EXTENDED_RELEASE_TABLET | Freq: Once | ORAL | Status: AC
  Administered 2023-10-18: 20 meq via ORAL
  Filled 2023-10-18: qty 1

## 2023-10-18 NOTE — ED Notes (Signed)
Pt placed on a bedpan and was able to urinate.  Linens changed.  RN attempted to convince pt to allow an IV so that she could get medicine to decrease the swelling in her legs however she refused.

## 2023-10-18 NOTE — Care Management (Signed)
°  °  Durable Medical Equipment  (From admission, onward)           Start     Ordered   10/18/23 1120  For home use only DME lightweight manual wheelchair with seat cushion  Once       Comments: Patient suffers from Weakness which impairs their ability to perform daily activities like toileting in the home.  A walker will not resolve  issue with performing activities of daily living. A wheelchair will allow patient to safely perform daily activities. Patient is not able to propel themselves in the home using a standard weight wheelchair due to general weakness. Patient can self propel in the lightweight wheelchair. Length of need Lifetime. Accessories: elevating leg rests (ELRs), wheel locks, extensions and anti-tippers.   10/18/23 1120

## 2023-10-18 NOTE — ED Notes (Signed)
Pt requested a cup of tea which was made and given by this RN.  While it "was not proper English tea it will do nicely."

## 2023-10-18 NOTE — ED Provider Notes (Signed)
Care assumed at 2330.  Patient here for evaluation following a fall.  She has pain to the left heel.  Care assumed pending CT foot, labs as patient is unwilling to walk.  CT is negative for acute injury.  On assessment patient refuses any kind of lab draw and wishes discharge.  Attempted to contact patient's daughter, unable to reach.  Chest x-ray with possible infiltrate.  She does not have any respiratory symptoms, normal work of breathing.  Will obtain a two-view to further evaluate.  Patient refuses two-view x-ray, has no respiratory symptoms.  Will not pursue at this time.  After multiple attempts and multiple discussions with her son-in-law over the phone patient is agreeable to lab draw.  Labs with mild hypokalemia, normal renal function.  BNP is mildly elevated but similar when compared to priors.  She does have mild lower extremity edema.  Patient refuses any IV Lasix.  UA is not consistent with UTI.  On examination she has no tenderness over the hips, knees bilaterally.  She is able to fully range these.  She has mild tenderness over the midfoot on the left.  Imaging is negative for fracture.  Discussed with son-in-law over the phone as well as patient.  Given her inability to ambulate safely and staying in independent living PT consult placed and TOC consult placed as well.  Patient care transferred pending PT eval and TOC.   Tilden Fossa, MD 10/18/23 [phone]

## 2023-10-18 NOTE — ED Notes (Signed)
Pt is refusing all labs, Just wants to go home.  Provider bedside.

## 2023-10-18 NOTE — ED Provider Notes (Signed)
Assumed care of the patient.  Patient has been seen by social work and at this moment the patients plan is to go home.  Given home health services.       Melene Plan, DO 10/18/23 1243

## 2023-10-18 NOTE — ED Notes (Signed)
Pt refusing vitals.

## 2023-10-18 NOTE — ED Notes (Signed)
Assisted pt onto bedpan to urinate.

## 2023-10-18 NOTE — Discharge Instructions (Addendum)
Follow up with your family doc in the office.  

## 2023-10-18 NOTE — Care Management (Addendum)
Transition of Care [facility]) - Emergency Department Mini Assessment   Patient Details  Name: Nicole [name] MRN: [id number] Date of Birth: 1935/03/07  Transition of Care [facility]) CM/SW Contact:    Nicole Pares, RN Phone Number: 10/18/2023, 11:14 AM   Clinical Narrative:  88 yo from Independent living apartment. ([facility]) Has had recent falls. Has daughter and son in law that live nearby, however when speaking to them they are struggling to take care of her. They would like her placed in SNF, however the patient does not have a qualifying stay for Medicare. Discussed option with them such as assisted living, agree to referral to a place for Mom.They do have HH with [facility], and have hired people before to come in to assist,  Her daughter shares she does not feel it is safe  for her to go home. Discussed with MD, social work and nursing.  HH orders max services placed. Wheelchair will be ordered to assist patient in apartment ( she has a walker and a BSC)  1130 called family back, Discussed tricare and calling them for any PCS.services.  Daughter cannot pick up needs PTAR husband can be there to receive She shared that she is burned out and her mother  has refused Assisted living before. Discussed that a place for mom will reach out. Son in law Nicole [name] can be called with DC instructions [phone] and call him when PTAR leaves. RN made aware  ED Mini Assessment: What brought you to the Emergency Department? : Fall  Barriers to Discharge:  (Requesting placement but does not have IP stay)  Barrier interventions: Place for MOm referral  Means of departure: Ambulance  Interventions which prevented an admission or readmission: Home Health Consult or Services    Patient Contact and Communications    Spoke with daughter Nicole [name]    ,                 Admission diagnosis:  fall Patient Active Problem List   Diagnosis Date Noted   Dupuytren contracture of both hands 07/21/2023    Age-related osteoporosis with current pathological fracture 03/31/2023   Mixed hyperlipidemia 03/31/2023   Nondisplaced fracture of greater trochanter of femur (HCC) 03/12/2023   Bronchiectasis without acute exacerbation (HCC) 03/12/2023   Nondisplaced fracture of greater trochanter of right femur (HCC) 03/10/2023   Chronic diastolic heart failure (HCC) 03/10/2023   Nondisplaced intertrochanteric fracture of right femur, initial encounter for closed fracture (HCC) 03/10/2023   History of pulmonary embolus (PE) 11/18/2022   Malnutrition of moderate degree 10/01/2022   Hyponatremia 09/30/2022   Bradycardia 09/29/2022   Hematemesis 09/29/2022   Hyperkalemia 09/29/2022   Advance care planning 09/29/2022   Bronchiectasis (HCC) 07/16/2022   Hypothyroidism 07/16/2022   Chronic obstructive lung disease (HCC) 03/24/2022   Hypertension 03/20/2022   PCP:  Nicole Georges, MD Pharmacy:   [facility] [phone] - [place], [place] - [address] [place] [address] Phone: [phone] Fax: [phone]  [facility] (NOT MAIL ORDER) - [place], [place] - [address] [facility] [address] [place] [address] Phone: [phone] Fax: [phone]  [facility] - [place], [place] - [address] [place] [address] Phone: [phone] Fax: [phone]  [facility] - [place], [place] - [address] [place] [address] [place] [address] Phone: [phone] Fax: [phone]

## 2023-10-18 NOTE — ED Notes (Signed)
Pt took screen that was shielding her from others as she was in the hallway and attempted to throw it and appeared angry.

## 2023-10-18 NOTE — Evaluation (Signed)
Physical Therapy Evaluation Patient Details Name: Nicole [name] MRN: [id number] DOB: 1934-12-02 Today's Date: 10/18/2023  History of Present Illness  Pt is an 88 y.o. female who presented 10/17/23 s/p fall. Imaging showed age-indeterminate, likely acute to subacute, nondisplaced L foot first digit proximal phalanx fracture. PMH: asthma, chronic diastolic HF, hypothyroidism, HTN, HLD, PE, glaucoma   Clinical Impression  Pt presents with condition above and deficits mentioned below, see PT Problem List. At this time, pt demonstrates deficits in cognition and is limited by anxiety. She has tangential speech and changes subjects often, having difficulty remaining on task or cues provided. Pt alternates emotions quickly, from one moment being calm and cooperative to then next screaming and resisting movement and impulsively lifting her feet off the ground. Pt alternates being agitated by therapist for providing physical support then gets agitated when the support is removed. Attempted to educate pt on need to listen and follow cues provided, but pt often talking over therapist or trailing off on a different subject. Unsure if this is her baseline cognition/behavior or not. Currently, she is requiring min-modA for bed mobility and mod-maxA for transfers and static standing balance with a RW, limited by her cognition, behavior mentioned above, and L foot pain. Unable to progress to ambulating this date due to pain and safety concerns due to pt impulsively pushing herself posteriorly and lifting her feet off the ground when she gets anxious. At this time, she would be unsafe to return to her ILF alone. Recommending short-term inpatient rehab, < 3 hours/day. Will continue to follow acutely.      If plan is discharge home, recommend the following: Two people to help with walking and/or transfers;A lot of help with bathing/dressing/bathroom;Assistance with cooking/housework;Direct supervision/assist for  medications management;Direct supervision/assist for financial management;Assist for transportation   Can travel by private vehicle   No    Equipment Recommendations Wheelchair (measurements PT);Wheelchair cushion (measurements PT);BSC/3in1;Hospital bed (pending progress)  Recommendations for Other Services  OT consult    Functional Status Assessment Patient has had a recent decline in their functional status and demonstrates the ability to make significant improvements in function in a reasonable and predictable amount of time.     Precautions / Restrictions Precautions Precautions: Fall Precaution Comments: wears L wrist splint (denies any weight bearing restrictions) Restrictions Weight Bearing Restrictions Per Provider Order: No      Mobility  Bed Mobility Overal bed mobility: Needs Assistance Bed Mobility: Supine to Sit, Sit to Supine     Supine to sit: Min assist, HOB elevated Sit to supine: Mod assist, HOB elevated   General bed mobility comments: MinA needed to lift pt's trunk up to sit EOB. ModA needed to direct trunk and lift legs onto bed with return to supine due to pt getting anxious and not following commands well after second attempt to stand    Transfers Overall transfer level: Needs assistance Equipment used: Rolling walker (2 wheels) Transfers: Sit to/from Stand Sit to Stand: Mod assist, From elevated surface, Max assist           General transfer comment: Pt stood from edge of stretcher 2x to RW, starting with bil hands on RW to pull up to stand. Pt demonstrated a posterior lean and decreased L weight bearing due to pain. Pt initially calm upon standing, modA to shift weight anteriorly off edge of stretcher and to maintain balance, cuing pt to push through her R foot and bil UEs, then suddenly pt switches to being anxious and  agitated at therapist for assisting and demonstrates retropulsion while she lifts her feet off the ground, needing maxA to  maintain her balance, safety, and return her to sit EOB each rep    Ambulation/Gait               General Gait Details: unable  Stairs            Wheelchair Mobility     Tilt Bed    Modified Rankin (Stroke Patients Only)       Balance Overall balance assessment: Needs assistance Sitting-balance support: Feet supported, Bilateral upper extremity supported Sitting balance-Leahy Scale: Poor Sitting balance - Comments: Pt varies from needing CGA to modA depending on her posterior lean and anxiety Postural control: Posterior lean Standing balance support: Bilateral upper extremity supported, During functional activity, Reliant on assistive device for balance Standing balance-Leahy Scale: Poor Standing balance comment: Varies from needing mod-maxA depending on her level of anxiety, retropulsion, and tendency to spontaneously lift her feet off the ground and rely on therapist for support while simultaneously upset at therapist for providing support                             Pertinent Vitals/Pain Pain Assessment Pain Assessment: Faces Faces Pain Scale: Hurts even more Pain Location: L heel; abdomen with use of gait belt when pt would resist and lift legs off ground Pain Descriptors / Indicators: Discomfort, Grimacing, Guarding, Moaning, Sore, Shooting Pain Intervention(s): Limited activity within patient's tolerance, Monitored during session, Repositioned    Home Living Family/patient expects to be discharged to:: Assisted living (ILF)                 Home Equipment: Agricultural consultant (2 wheels);Rollator (4 wheels) Additional Comments: Pt reports no STE and she has a walk-in shower; daughter seems to be involved frequently in her care    Prior Function Prior Level of Function : Patient poor historian/Family not available;Needs assist             Mobility Comments: Intermittent no AD in her home, uses RW vs 4-wheeled walker (no seat) outside her  room ADLs Comments: Daughter assists with medication management per pt; pt reports she does not do much cooking or cleaning but is independent with bathing and dressing     Extremity/Trunk Assessment   Upper Extremity Assessment Upper Extremity Assessment: Defer to OT evaluation    Lower Extremity Assessment Lower Extremity Assessment: Generalized weakness;LLE deficits/detail (edema bil) LLE Deficits / Details: noted lower leg and foot/ankle edema bil (L>R); pain with weight bearing and ROM of foot    Cervical / Trunk Assessment Cervical / Trunk Assessment: Normal  Communication   Communication Communication: No apparent difficulties  Cognition Arousal: Alert Behavior During Therapy: Agitated, Anxious, Impulsive Overall Cognitive Status: No family/caregiver present to determine baseline cognitive functioning                                 General Comments: Pt with tangential speech and changing subjects or speaking off subject often. Pt alternates emotions quickly, from one moment being calm and cooperative to then next screaming and resisting movement and impulsively lifting her feet off the ground. Pt alternates being agitated by therapist providing support then getting agitated when the support is removed. Difficult to get pt to follow cues at times. attempted to educate pt on need to listen and  follow cues provided, but pt often talking over therapist or trailing off on a different subject. Unsure if baseline or not        General Comments      Exercises     Assessment/Plan    PT Assessment Patient needs continued PT services  PT Problem List Decreased strength;Decreased activity tolerance;Decreased balance;Decreased mobility;Decreased range of motion;Decreased cognition;Decreased coordination;Decreased knowledge of use of DME;Decreased safety awareness;Pain       PT Treatment Interventions DME instruction;Gait training;Functional mobility  training;Therapeutic activities;Therapeutic exercise;Balance training;Neuromuscular re-education;Cognitive remediation;Patient/family education;Wheelchair mobility training    PT Goals (Current goals can be found in the Care Plan section)  Acute Rehab PT Goals Patient Stated Goal: to reduce pain PT Goal Formulation: With patient Time For Goal Achievement: 11/01/23 Potential to Achieve Goals: Fair    Frequency Min 1X/week     Co-evaluation               AM-PAC PT "6 Clicks" Mobility  Outcome Measure Help needed turning from your back to your side while in a flat bed without using bedrails?: A Little Help needed moving from lying on your back to sitting on the side of a flat bed without using bedrails?: A Little Help needed moving to and from a bed to a chair (including a wheelchair)?: A Lot Help needed standing up from a chair using your arms (e.g., wheelchair or bedside chair)?: A Lot Help needed to walk in hospital room?: Total Help needed climbing 3-5 steps with a railing? : Total 6 Click Score: 12    End of Session Equipment Utilized During Treatment: Gait belt Activity Tolerance: Patient limited by pain;Other (comment) (limited by anxiety and impaired cognition) Patient left: in bed   PT Visit Diagnosis: Unsteadiness on feet (R26.81);Other abnormalities of gait and mobility (R26.89);Muscle weakness (generalized) (M62.81);History of falling (Z91.81);Repeated falls (R29.6);Difficulty in walking, not elsewhere classified (R26.2);Pain Pain - Right/Left: Left Pain - part of body: Ankle and joints of foot    Time: 0926-0952 PT Time Calculation (min) (ACUTE ONLY): 26 min   Charges:   PT Evaluation $PT Eval Moderate Complexity: 1 Mod PT Treatments $Therapeutic Activity: 8-22 mins PT General Charges $$ ACUTE PT VISIT: 1 Visit         Virgil Benedict, PT, DPT Acute Rehabilitation Services  Office: [phone]   Bettina Gavia 10/18/2023, 10:10 AM

## 2023-10-18 NOTE — ED Notes (Signed)
Pt refused vitals 

## 2023-10-18 NOTE — ED Notes (Signed)
Pt was assisted to stand and was unable to stand; she could not put weight on her foot and stated her heel hurt.

## 2023-10-26 ENCOUNTER — Telehealth: Payer: Self-pay | Admitting: Family Medicine

## 2023-10-26 NOTE — Telephone Encounter (Signed)
 Left a detailed message at the daughter's voicemail requesting she call the office to schedule an appt for evaluation of vertigo.

## 2023-10-26 NOTE — Telephone Encounter (Signed)
 Patient daughter Selena Batten is wanting a call back regarding patient medication for vertigo  she stated that the patient had a fall the other day and the veritgo has come back and the medication for it was discounted

## 2023-10-27 ENCOUNTER — Telehealth: Payer: Self-pay

## 2023-10-27 NOTE — Telephone Encounter (Signed)
 Copied from CRM [id number]. Topic: General - Other >> Oct 27, 2023  4:38 PM Drema MATSU wrote: Reason for CRM: Patient daughter is trying to get mom into skilled nursing to get her back normal. She states pt cant physically come in to see doctor fell twice since December patient keeps developing problems after another. She needs paperwork filled out F1 or F2 and physical paperwork filled out. She stated that they have a physican at [facility] that can look at patient  Luke Molt (daughter) hired home health around the clock nurses that assist with non medical services and needs assistance and is requesting a callback. They are trying to get mom moved on Friday from her independent cottage.

## 2023-10-28 NOTE — Telephone Encounter (Signed)
 Copied from CRM [id number]. Topic: Clinical - Medical Advice >> Oct 28, 2023 12:15 PM Corean SAUNDERS wrote: Reason for CRM: Patients daughter is very distressed about her mothers recurrent health concerns and is still waiting on Dr. Ozell to call her back regarding her moms extreme vertigo and needs help getting her mother into a skilled nursing home as her mother is very frail. Please call her back as patients daughter is paying thousands of dollars for round the clock nursing staff to be at with her mom.

## 2023-10-28 NOTE — Telephone Encounter (Signed)
 Spoke with the patient's daughter and informed her of the message below.  She is aware this will be discussed during the video visit on 1/13.

## 2023-10-28 NOTE — Telephone Encounter (Signed)
 I can fill out the paperwork, but there are questions on it that I will need to ask the daughter about her functional status. Can we set up a video visit time -- that way she does not need to bring the patient to the office.

## 2023-11-02 ENCOUNTER — Encounter: Payer: Self-pay | Admitting: Family Medicine

## 2023-11-02 ENCOUNTER — Telehealth: Payer: Self-pay | Admitting: Family Medicine

## 2023-11-02 ENCOUNTER — Inpatient Hospital Stay: Payer: Medicare Other | Admitting: Family Medicine

## 2023-11-02 ENCOUNTER — Telehealth (INDEPENDENT_AMBULATORY_CARE_PROVIDER_SITE_OTHER): Payer: Medicare Other | Admitting: Family Medicine

## 2023-11-02 DIAGNOSIS — J471 Bronchiectasis with (acute) exacerbation: Secondary | ICD-10-CM | POA: Diagnosis not present

## 2023-11-02 MED ORDER — MUCINEX DM MAXIMUM STRENGTH 60-1200 MG PO TB12: 1.0000 | ORAL_TABLET | Freq: Two times a day (BID) | ORAL | 5 refills | Status: DC | PRN

## 2023-11-02 NOTE — Telephone Encounter (Signed)
 Patient dropped off document FL2 for a skilled nursing facility, to be filled out by provider. Patient requested to send it back via Fax within ASAP. Document is located in providers tray at front office.Please advise at Mobile [phone] (mobile)

## 2023-11-02 NOTE — Progress Notes (Signed)
 Virtual Medical Office Visit  Patient:  Nicole [name]      Age: 89 y.o.       Sex:  female  Date:   11/02/2023  PCP:    Ozell Heron CHRISTELLA, MD   Today's Healthcare Provider: Heron CHRISTELLA Ozell, MD    Assessment/Plan:   Summary assessment:  Nicole [name] was seen today for follow-up and dizziness.  Bronchiectasis with acute exacerbation (HCC) -     Mucinex  DM Maximum Strength; Take 1 tablet by mouth 2 (two) times daily as needed.  Dispense: 60 tablet; Refill: 5   I asked about ordering home health for PT again and they felt that it wasn't very effective for her, they are most likely going to move her to assisted living rather than independent due to her advancing care needs. We discussed adding a cough suppressant to help her get better rest at night, will send in mucinex  DM BID PRN. She will see me in April for her regular follow up.   No follow-ups on file.   She was advised to call the office or go to ER if her condition worsens    Subjective:   Nicole [name] is a 89 y.o. female with PMH significant for: Past Medical History:  Diagnosis Date   Allergies    Asthma    Bronchiectasis (HCC) 07/16/2022   Chronic diastolic heart failure (HCC) 03/10/2023   Chronic obstructive lung disease (HCC) 03/24/2022   Glaucoma    History of pulmonary embolus (PE) 11/18/2022   Hyperlipidemia    Hypertension 03/20/2022     Presenting today with: Chief Complaint  Patient presents with   Follow-up    Patient presents for ER follow up   Dizziness     She clarifies and reports that her condition: Pt had a fall at home on 10/17/23. She did not fracture anything, just sprained her ankle. Daughter and son in law are present on the video call. She reports that she has a caregiver come in for 4 hours per day, states that her home health was discontinued because of the extra help. States they were only coming about twice a week for less than 30 minutes each time.  Daughter reports that these  multiple falls and nonstop coughing she has become more bedfast and it is more difficult to sustain staying in the independent living. She is becoming more bed fast and is having trouble walking. They are considering moving her to assisted living rather than independent living.  She reports that the cough is persistent, continuous at night. She does have bronchiectasis and allergies, is on inhalers regularly. Has trouble using the albuterol  due to severe hand OA. Daughter reports when she visits she gives her the nebulizer treatment however she cannot do it more often due to the hand arthritis.   She denies having any: Loss of consciousness.           Objective/Observations  Physical Exam:  Polite and friendly Gen: NAD, resting comfortably Pulm: Normal work of breathing Neuro: Grossly normal, moves all extremities Psych: Normal affect and thought content Problem specific physical exam findings:  N/A  No images are attached to the encounter or orders placed in the encounter.    Results: No results found for any visits on 11/02/23.   Recent Results (from the past 2160 hours)  Urinalysis, w/ Reflex to Culture (Infection Suspected) -Urine, Clean Catch     Status: Abnormal   Collection Time: 10/18/23  4:27 AM  Result  Value Ref Range   Specimen Source URINE, CLEAN CATCH    Color, Urine STRAW (A) YELLOW   APPearance CLEAR CLEAR   Specific Gravity, Urine 1.008 1.005 - 1.030   pH 7.0 5.0 - 8.0   Glucose, UA NEGATIVE NEGATIVE mg/dL   Hgb urine dipstick NEGATIVE NEGATIVE   Bilirubin Urine NEGATIVE NEGATIVE   Ketones, ur NEGATIVE NEGATIVE mg/dL   Protein, ur NEGATIVE NEGATIVE mg/dL   Nitrite NEGATIVE NEGATIVE   Leukocytes,Ua TRACE (A) NEGATIVE   RBC / HPF 0-5 0 - 5 RBC/hpf   WBC, UA 0-5 0 - 5 WBC/hpf    Comment:        Reflex urine culture not performed if WBC <=10, OR if Squamous epithelial cells >5. If Squamous epithelial cells >5 suggest recollection.    Bacteria, UA NONE  SEEN NONE SEEN   Squamous Epithelial / HPF 0-5 0 - 5 /HPF    Comment: Performed at [facility], [address], [place], [place] [address]  Comprehensive metabolic panel     Status: Abnormal   Collection Time: 10/18/23  5:06 AM  Result Value Ref Range   Sodium 142 135 - 145 mmol/L   Potassium 3.2 (L) 3.5 - 5.1 mmol/L   Chloride 103 98 - 111 mmol/L   CO2 28 22 - 32 mmol/L   Glucose, Bld 91 70 - 99 mg/dL    Comment: Glucose reference range applies only to samples taken after fasting for at least 8 hours.   BUN 10 8 - 23 mg/dL   Creatinine, Ser 9.46 0.44 - 1.00 mg/dL   Calcium  8.8 (L) 8.9 - 10.3 mg/dL   Total Protein 6.9 6.5 - 8.1 g/dL   Albumin  2.7 (L) 3.5 - 5.0 g/dL   AST 21 15 - 41 U/L   ALT 21 0 - 44 U/L   Alkaline Phosphatase 96 38 - 126 U/L   Total Bilirubin 0.7 <1.2 mg/dL   GFR, Estimated >39 >39 mL/min    Comment: (NOTE) Calculated using the CKD-EPI Creatinine Equation (2021)    Anion gap 11 5 - 15    Comment: Performed at [facility], [address], [place], [place] [address]  CBC with Differential     Status: Abnormal   Collection Time: 10/18/23  5:06 AM  Result Value Ref Range   WBC 11.7 (H) 4.0 - 10.5 K/uL   RBC 3.11 (L) 3.87 - 5.11 MIL/uL   Hemoglobin 8.9 (L) 12.0 - 15.0 g/dL   HCT 72.4 (L) 63.9 - 53.9 %   MCV 88.4 80.0 - 100.0 fL   MCH 28.6 26.0 - 34.0 pg   MCHC 32.4 30.0 - 36.0 g/dL   RDW 85.6 88.4 - 84.4 %   Platelets 361 150 - 400 K/uL   nRBC 0.0 0.0 - 0.2 %   Neutrophils Relative % 81 %   Neutro Abs 9.6 (H) 1.7 - 7.7 K/uL   Lymphocytes Relative 10 %   Lymphs Abs 1.2 0.7 - 4.0 K/uL   Monocytes Relative 6 %   Monocytes Absolute 0.7 0.1 - 1.0 K/uL   Eosinophils Relative 2 %   Eosinophils Absolute 0.2 0.0 - 0.5 K/uL   Basophils Relative 0 %   Basophils Absolute 0.1 0.0 - 0.1 K/uL   Immature Granulocytes 1 %   Abs Immature Granulocytes 0.06 0.00 - 0.07 K/uL    Comment: Performed at [facility], [address], [place], [place]  [address]  Brain natriuretic peptide  Status: Abnormal   Collection Time: 10/18/23  5:06 AM  Result Value Ref Range   B Natriuretic Peptide 514.9 (H) 0.0 - 100.0 pg/mL    Comment: Performed at [facility], [address] [place], [place] [address]           Virtual Visit via Video   I connected with Nicole [name] on 11/02/23 at  8:30 AM EST by a video enabled telemedicine application and verified that I am speaking with the correct person using two identifiers. The limitations of evaluation and management by telemedicine and the availability of in person appointments were discussed. The patient expressed understanding and agreed to proceed.   Percentage of appointment time on video:  100% Patient location: Home Provider location: [facility] Office Persons participating in the virtual visit: Myself and Patient

## 2023-11-02 NOTE — Telephone Encounter (Signed)
 Do we have these forms already? Or do they need to send them to [place]?

## 2023-11-02 NOTE — Telephone Encounter (Signed)
 Copied from CRM [id number]. Topic: General - Other >> Nov 02, 2023 10:13 AM Macario HERO wrote: Reason for CRM: Kristin Latendresse from [facility] - called stated they will need an FL2 and HMP - History of physical faxed before Wednesday which is her move in date. If you have a face sheet please fax as well.  Phone: [phone] - Fax: [phone]

## 2023-11-04 DIAGNOSIS — Z0279 Encounter for issue of other medical certificate: Secondary | ICD-10-CM

## 2023-11-04 NOTE — Telephone Encounter (Signed)
 PCP completed the form and this was faxed along with the current medication list to [facility]-fax#[phone] (ph#[phone])-attn: Elyce Hams; admissions director.  Form was sent to be scanned also.  Spoke with the patient's daughter and informed her of this .

## 2023-11-06 ENCOUNTER — Telehealth: Payer: Self-pay

## 2023-11-06 DIAGNOSIS — W19XXXD Unspecified fall, subsequent encounter: Secondary | ICD-10-CM

## 2023-11-06 NOTE — Telephone Encounter (Signed)
Copied from CRM [id number]. Topic: Clinical - Medical Advice >> Nov 06, 2023  9:06 AM Orinda Kenner C wrote: Reason for CRM: Patient's daughter [name] states patient is doing a lot of better than a week ago, patient will not be moved to skilled nursing home and keep her on the wait list for assistant living. Can Dr. Casimiro Needle re-order home care visit per [facility] protocol for physical therapy. Please advise and call back [name] [phone].

## 2023-11-06 NOTE — Addendum Note (Signed)
Addended by: Johnella Moloney on: 11/06/2023 05:04 PM   Modules accepted: Orders

## 2023-11-06 NOTE — Telephone Encounter (Signed)
I called [facility] at [phone] to give a verbal order for PT as below.  Per Misty Stanley, the patient was discharged on 1/8 so a new referral is needed.  A new referral was placed and I left a detailed message with this information at the daughter's cell number.

## 2023-11-06 NOTE — Telephone Encounter (Signed)
[place] to send orders. 

## 2023-11-11 ENCOUNTER — Other Ambulatory Visit: Payer: Self-pay | Admitting: Family Medicine

## 2023-12-21 ENCOUNTER — Other Ambulatory Visit: Payer: Self-pay | Admitting: Family Medicine

## 2023-12-21 DIAGNOSIS — J479 Bronchiectasis, uncomplicated: Secondary | ICD-10-CM

## 2023-12-22 ENCOUNTER — Telehealth: Payer: Self-pay

## 2023-12-22 NOTE — Telephone Encounter (Signed)
 Noted ok to make changes

## 2023-12-22 NOTE — Telephone Encounter (Signed)
 Copied from CRM [id number]. Topic: General - Other >> Dec 22, 2023  9:10 AM Kathryne Eriksson wrote: Reason for CRM: Message For Karie Georges, MD >> Dec 22, 2023  9:12 AM Kathryne Eriksson wrote: Patient's daughter called on behalf of patient, wanting to let Karie Georges, MD know that patient is now living in an assistant living therefor she no longer needs her medications auto refilled anymore.

## 2023-12-25 ENCOUNTER — Telehealth: Payer: Self-pay

## 2023-12-25 NOTE — Transitions of Care (Post Inpatient/ED Visit) (Signed)
   12/25/2023  Name: Nicole [name] MRN: [id number] DOB: 1935-05-11  Today's TOC FU Call Status: Today's TOC FU Call Status:: Unsuccessful Call (1st Attempt) Unsuccessful Call (1st Attempt) Date: 12/25/23  Attempted to reach the patient regarding the most recent Inpatient/ED visit.  Follow Up Plan: Additional outreach attempts will be made to reach the patient to complete the Transitions of Care (Post Inpatient/ED visit) call.   Signature Karena Addison, LPN [facility] Nurse Health Advisor Direct Dial [phone]

## 2023-12-30 NOTE — Transitions of Care (Post Inpatient/ED Visit) (Signed)
   12/30/2023  Name: JEANNINE PENNISI MRN: [id number] DOB: [birth year]  Today's TOC FU Call Status: Today's TOC FU Call Status:: Unsuccessful Call (2nd Attempt) Unsuccessful Call (1st Attempt) Date: 12/25/23  Attempted to reach the patient regarding the most recent Inpatient/ED visit.  Follow Up Plan: Additional outreach attempts will be made to reach the patient to complete the Transitions of Care (Post Inpatient/ED visit) call.   Signature Karena Addison, LPN [facility] Nurse Health Advisor Direct Dial [phone]

## 2023-12-30 NOTE — Transitions of Care (Post Inpatient/ED Visit) (Signed)
   12/30/2023  Name: Nicole [name] MRN: [id number] DOB: [birth year]  Today's TOC FU Call Status: Today's TOC FU Call Status:: Unsuccessful Call (3rd Attempt) Unsuccessful Call (1st Attempt) Date: 12/25/23 Unsuccessful Call (3rd Attempt) Date: 12/30/23  Attempted to reach the patient regarding the most recent Inpatient/ED visit.  Follow Up Plan: No further outreach attempts will be made at this time. We have been unable to contact the patient.  Signature Karena Addison, LPN [facility] Nurse Health Advisor Direct Dial [phone]

## 2023-12-31 ENCOUNTER — Telehealth: Payer: Self-pay | Admitting: *Deleted

## 2023-12-31 NOTE — Telephone Encounter (Signed)
 Left a detailed message with the information below at the daughter's cell number of [phone].

## 2023-12-31 NOTE — Telephone Encounter (Signed)
 Copied from CRM [id number]. Topic: General - Other >> Dec 31, 2023  9:18 AM Almira Coaster wrote: Reason for EAV:[id number] Yetta Barre, patient's daughter is returning a call from [facility] regarding most ER visit. States she was admitted in the ICU due to blood clots in the lung. She has an appointment scheduled with Dr.Michael on 01/05/2024; however, she wanted to advise that the patient is now in an assisted living facility called [facility] and they have a doctor onsite that makes rounds and sees the patient twice a week. She wants to know if it's still necessary to see Dr.Michael.

## 2023-12-31 NOTE — Telephone Encounter (Signed)
 If she is now under the care of a new physician then I do not need to see her is this physician going to be taking over her care?

## 2024-01-05 ENCOUNTER — Ambulatory Visit: Admitting: Family Medicine

## 2024-01-18 ENCOUNTER — Emergency Department (HOSPITAL_COMMUNITY)

## 2024-01-18 ENCOUNTER — Observation Stay (HOSPITAL_BASED_OUTPATIENT_CLINIC_OR_DEPARTMENT_OTHER)

## 2024-01-18 ENCOUNTER — Inpatient Hospital Stay (HOSPITAL_COMMUNITY)
Admission: EM | Admit: 2024-01-18 | Discharge: 2024-02-18 | DRG: 193 | Disposition: E | Attending: Internal Medicine | Admitting: Internal Medicine

## 2024-01-18 ENCOUNTER — Encounter (HOSPITAL_COMMUNITY): Payer: Self-pay

## 2024-01-18 ENCOUNTER — Other Ambulatory Visit: Payer: Self-pay

## 2024-01-18 DIAGNOSIS — J121 Respiratory syncytial virus pneumonia: Secondary | ICD-10-CM | POA: Diagnosis not present

## 2024-01-18 DIAGNOSIS — I340 Nonrheumatic mitral (valve) insufficiency: Secondary | ICD-10-CM | POA: Diagnosis not present

## 2024-01-18 DIAGNOSIS — Z7901 Long term (current) use of anticoagulants: Secondary | ICD-10-CM

## 2024-01-18 DIAGNOSIS — R000 Tachycardia, unspecified: Secondary | ICD-10-CM

## 2024-01-18 DIAGNOSIS — I110 Hypertensive heart disease with heart failure: Secondary | ICD-10-CM | POA: Diagnosis present

## 2024-01-18 DIAGNOSIS — R7989 Other specified abnormal findings of blood chemistry: Secondary | ICD-10-CM

## 2024-01-18 DIAGNOSIS — I959 Hypotension, unspecified: Secondary | ICD-10-CM | POA: Diagnosis present

## 2024-01-18 DIAGNOSIS — Z825 Family history of asthma and other chronic lower respiratory diseases: Secondary | ICD-10-CM

## 2024-01-18 DIAGNOSIS — J440 Chronic obstructive pulmonary disease with acute lower respiratory infection: Secondary | ICD-10-CM | POA: Diagnosis present

## 2024-01-18 DIAGNOSIS — J449 Chronic obstructive pulmonary disease, unspecified: Secondary | ICD-10-CM | POA: Diagnosis present

## 2024-01-18 DIAGNOSIS — Z880 Allergy status to penicillin: Secondary | ICD-10-CM

## 2024-01-18 DIAGNOSIS — Z8249 Family history of ischemic heart disease and other diseases of the circulatory system: Secondary | ICD-10-CM

## 2024-01-18 DIAGNOSIS — Z79899 Other long term (current) drug therapy: Secondary | ICD-10-CM

## 2024-01-18 DIAGNOSIS — Z902 Acquired absence of lung [part of]: Secondary | ICD-10-CM

## 2024-01-18 DIAGNOSIS — J479 Bronchiectasis, uncomplicated: Secondary | ICD-10-CM

## 2024-01-18 DIAGNOSIS — I5032 Chronic diastolic (congestive) heart failure: Secondary | ICD-10-CM | POA: Diagnosis not present

## 2024-01-18 DIAGNOSIS — Z90710 Acquired absence of both cervix and uterus: Secondary | ICD-10-CM

## 2024-01-18 DIAGNOSIS — R627 Adult failure to thrive: Secondary | ICD-10-CM | POA: Diagnosis not present

## 2024-01-18 DIAGNOSIS — E039 Hypothyroidism, unspecified: Secondary | ICD-10-CM | POA: Diagnosis present

## 2024-01-18 DIAGNOSIS — Z86718 Personal history of other venous thrombosis and embolism: Secondary | ICD-10-CM

## 2024-01-18 DIAGNOSIS — R64 Cachexia: Secondary | ICD-10-CM | POA: Diagnosis present

## 2024-01-18 DIAGNOSIS — I361 Nonrheumatic tricuspid (valve) insufficiency: Secondary | ICD-10-CM | POA: Diagnosis not present

## 2024-01-18 DIAGNOSIS — Z833 Family history of diabetes mellitus: Secondary | ICD-10-CM

## 2024-01-18 DIAGNOSIS — E861 Hypovolemia: Secondary | ICD-10-CM | POA: Diagnosis present

## 2024-01-18 DIAGNOSIS — F0390 Unspecified dementia without behavioral disturbance: Secondary | ICD-10-CM | POA: Diagnosis present

## 2024-01-18 DIAGNOSIS — I5043 Acute on chronic combined systolic (congestive) and diastolic (congestive) heart failure: Secondary | ICD-10-CM

## 2024-01-18 DIAGNOSIS — G9341 Metabolic encephalopathy: Secondary | ICD-10-CM | POA: Diagnosis present

## 2024-01-18 DIAGNOSIS — J9601 Acute respiratory failure with hypoxia: Secondary | ICD-10-CM | POA: Diagnosis present

## 2024-01-18 DIAGNOSIS — Z515 Encounter for palliative care: Secondary | ICD-10-CM

## 2024-01-18 DIAGNOSIS — I5031 Acute diastolic (congestive) heart failure: Secondary | ICD-10-CM

## 2024-01-18 DIAGNOSIS — Z66 Do not resuscitate: Secondary | ICD-10-CM | POA: Diagnosis not present

## 2024-01-18 DIAGNOSIS — E8809 Other disorders of plasma-protein metabolism, not elsewhere classified: Secondary | ICD-10-CM | POA: Diagnosis present

## 2024-01-18 DIAGNOSIS — J188 Other pneumonia, unspecified organism: Secondary | ICD-10-CM | POA: Diagnosis present

## 2024-01-18 DIAGNOSIS — I4891 Unspecified atrial fibrillation: Principal | ICD-10-CM | POA: Diagnosis present

## 2024-01-18 DIAGNOSIS — Z7951 Long term (current) use of inhaled steroids: Secondary | ICD-10-CM

## 2024-01-18 DIAGNOSIS — I4510 Unspecified right bundle-branch block: Secondary | ICD-10-CM | POA: Diagnosis present

## 2024-01-18 DIAGNOSIS — I342 Nonrheumatic mitral (valve) stenosis: Secondary | ICD-10-CM

## 2024-01-18 DIAGNOSIS — D649 Anemia, unspecified: Secondary | ICD-10-CM | POA: Diagnosis present

## 2024-01-18 DIAGNOSIS — R0602 Shortness of breath: Secondary | ICD-10-CM

## 2024-01-18 DIAGNOSIS — I350 Nonrheumatic aortic (valve) stenosis: Secondary | ICD-10-CM | POA: Diagnosis present

## 2024-01-18 DIAGNOSIS — Z86711 Personal history of pulmonary embolism: Secondary | ICD-10-CM | POA: Diagnosis present

## 2024-01-18 DIAGNOSIS — Z1152 Encounter for screening for COVID-19: Secondary | ICD-10-CM

## 2024-01-18 DIAGNOSIS — I10 Essential (primary) hypertension: Secondary | ICD-10-CM | POA: Diagnosis present

## 2024-01-18 DIAGNOSIS — Z555 Less than a high school diploma: Secondary | ICD-10-CM

## 2024-01-18 DIAGNOSIS — Z79890 Hormone replacement therapy (postmenopausal): Secondary | ICD-10-CM

## 2024-01-18 DIAGNOSIS — E782 Mixed hyperlipidemia: Secondary | ICD-10-CM | POA: Diagnosis present

## 2024-01-18 DIAGNOSIS — J470 Bronchiectasis with acute lower respiratory infection: Secondary | ICD-10-CM | POA: Diagnosis present

## 2024-01-18 DIAGNOSIS — I480 Paroxysmal atrial fibrillation: Secondary | ICD-10-CM | POA: Diagnosis present

## 2024-01-18 DIAGNOSIS — J441 Chronic obstructive pulmonary disease with (acute) exacerbation: Secondary | ICD-10-CM

## 2024-01-18 DIAGNOSIS — J189 Pneumonia, unspecified organism: Secondary | ICD-10-CM | POA: Diagnosis present

## 2024-01-18 DIAGNOSIS — Z681 Body mass index (BMI) 19 or less, adult: Secondary | ICD-10-CM

## 2024-01-18 DIAGNOSIS — Z604 Social exclusion and rejection: Secondary | ICD-10-CM | POA: Diagnosis present

## 2024-01-18 DIAGNOSIS — Z885 Allergy status to narcotic agent status: Secondary | ICD-10-CM

## 2024-01-18 DIAGNOSIS — I081 Rheumatic disorders of both mitral and tricuspid valves: Secondary | ICD-10-CM | POA: Diagnosis present

## 2024-01-18 LAB — COMPREHENSIVE METABOLIC PANEL WITH GFR
ALT: 28 U/L (ref 0–44)
AST: 29 U/L (ref 15–41)
Albumin: 3.7 g/dL (ref 3.5–5.0)
Alkaline Phosphatase: 113 U/L (ref 38–126)
Anion gap: 12 (ref 5–15)
BUN: 12 mg/dL (ref 8–23)
CO2: 26 mmol/L (ref 22–32)
Calcium: 9.5 mg/dL (ref 8.9–10.3)
Chloride: 101 mmol/L (ref 98–111)
Creatinine, Ser: 0.59 mg/dL (ref 0.44–1.00)
GFR, Estimated: >60 mL/min (ref >60)
Glucose, Bld: 101 mg/dL — ABNORMAL HIGH (ref 70–99)
Potassium: 4.1 mmol/L (ref 3.5–5.1)
Sodium: 139 mmol/L (ref 135–145)
Total Bilirubin: 0.6 mg/dL (ref 0.0–1.2)
Total Protein: 7.8 g/dL (ref 6.5–8.1)

## 2024-01-18 LAB — CBC WITH DIFFERENTIAL/PLATELET
Abs Immature Granulocytes: 0.05 10*3/uL (ref 0.00–0.07)
Basophils Absolute: 0.1 10*3/uL (ref 0.0–0.1)
Basophils Relative: 1 %
Eosinophils Absolute: 0.1 10*3/uL (ref 0.0–0.5)
Eosinophils Relative: 1 %
HCT: 31.8 % — ABNORMAL LOW (ref 36.0–46.0)
Hemoglobin: 10.3 g/dL — ABNORMAL LOW (ref 12.0–15.0)
Immature Granulocytes: 1 %
Lymphocytes Relative: 9 %
Lymphs Abs: 0.9 10*3/uL (ref 0.7–4.0)
MCH: 30.2 pg (ref 26.0–34.0)
MCHC: 32.4 g/dL (ref 30.0–36.0)
MCV: 93.3 fL (ref 80.0–100.0)
Monocytes Absolute: 0.7 10*3/uL (ref 0.1–1.0)
Monocytes Relative: 7 %
Neutro Abs: 8.6 10*3/uL — ABNORMAL HIGH (ref 1.7–7.7)
Neutrophils Relative %: 81 %
Platelets: 268 10*3/uL (ref 150–400)
RBC: 3.41 MIL/uL — ABNORMAL LOW (ref 3.87–5.11)
RDW: 17.5 % — ABNORMAL HIGH (ref 11.5–15.5)
WBC: 10.4 10*3/uL (ref 4.0–10.5)
nRBC: 0 % (ref 0.0–0.2)

## 2024-01-18 LAB — ECHOCARDIOGRAM COMPLETE
AR max vel: 0.7 cm2
AV Area VTI: 0.64 cm2
AV Area mean vel: 0.66 cm2
AV Mean grad: 33 mmHg
AV Peak grad: 51.1 mmHg
Ao pk vel: 3.57 m/s
Area-P 1/2: 5.38 cm2
Height: 60 in
MV VTI: 2.35 cm2
S' Lateral: 2.7 cm
Weight: 1410.94 [oz_av]

## 2024-01-18 LAB — BRAIN NATRIURETIC PEPTIDE: B Natriuretic Peptide: 1319.6 pg/mL — ABNORMAL HIGH (ref 0.0–100.0)

## 2024-01-18 LAB — URINALYSIS, W/ REFLEX TO CULTURE (INFECTION SUSPECTED)
Bilirubin Urine: NEGATIVE
Glucose, UA: NEGATIVE mg/dL
Ketones, ur: NEGATIVE mg/dL
Leukocytes,Ua: NEGATIVE
Nitrite: NEGATIVE
Protein, ur: 30 mg/dL — AB
Specific Gravity, Urine: 1.011 (ref 1.005–1.030)
pH: 7 (ref 5.0–8.0)

## 2024-01-18 LAB — MAGNESIUM: Magnesium: 2.1 mg/dL (ref 1.7–2.4)

## 2024-01-18 LAB — LACTIC ACID, PLASMA: Lactic Acid, Venous: 1.5 mmol/L (ref 0.5–1.9)

## 2024-01-18 LAB — TSH: TSH: 4.127 u[IU]/mL (ref 0.350–4.500)

## 2024-01-18 LAB — LIPASE, BLOOD: Lipase: 22 U/L (ref 11–51)

## 2024-01-18 LAB — TROPONIN I (HIGH SENSITIVITY)
Troponin I (High Sensitivity): 25 ng/L — ABNORMAL HIGH (ref <18)
Troponin I (High Sensitivity): 30 ng/L — ABNORMAL HIGH (ref <18)

## 2024-01-18 MED ORDER — ACETAMINOPHEN 650 MG RE SUPP: 650.0000 mg | Freq: Four times a day (QID) | RECTAL | Status: DC | PRN

## 2024-01-18 MED ORDER — APIXABAN 5 MG PO TABS: 5.0000 mg | ORAL_TABLET | Freq: Two times a day (BID) | ORAL | Status: DC

## 2024-01-18 MED ORDER — DILTIAZEM HCL-DEXTROSE 125-5 MG/125ML-% IV SOLN (PREMIX)
5.0000 mg/h | INTRAVENOUS | Status: DC
  Administered 2024-01-18: 5 mg/h via INTRAVENOUS
  Filled 2024-01-18: qty 125

## 2024-01-18 MED ORDER — ATORVASTATIN CALCIUM 10 MG PO TABS
20.0000 mg | ORAL_TABLET | Freq: Every day | ORAL | Status: DC
Start: 2024-01-19 — End: 2024-01-19
  Administered 2024-01-19: 20 mg via ORAL
  Filled 2024-01-18: qty 2

## 2024-01-18 MED ORDER — ALBUTEROL SULFATE (2.5 MG/3ML) 0.083% IN NEBU: 2.5000 mg | INHALATION_SOLUTION | Freq: Four times a day (QID) | RESPIRATORY_TRACT | Status: DC | PRN

## 2024-01-18 MED ORDER — APIXABAN 2.5 MG PO TABS
2.5000 mg | ORAL_TABLET | Freq: Two times a day (BID) | ORAL | Status: DC
  Administered 2024-01-18 – 2024-01-20 (×4): 2.5 mg via ORAL
  Filled 2024-01-18 (×4): qty 1

## 2024-01-18 MED ORDER — LEVOTHYROXINE SODIUM 75 MCG PO TABS
75.0000 ug | ORAL_TABLET | Freq: Every day | ORAL | Status: DC
  Administered 2024-01-19 – 2024-01-20 (×2): 75 ug via ORAL
  Filled 2024-01-18 (×2): qty 1

## 2024-01-18 MED ORDER — METOPROLOL TARTRATE 5 MG/5ML IV SOLN
5.0000 mg | Freq: Four times a day (QID) | INTRAVENOUS | Status: DC
  Administered 2024-01-18 (×2): 5 mg via INTRAVENOUS
  Filled 2024-01-18 (×3): qty 5

## 2024-01-18 MED ORDER — SODIUM CHLORIDE 0.9% FLUSH
3.0000 mL | Freq: Two times a day (BID) | INTRAVENOUS | Status: DC
  Administered 2024-01-18 – 2024-01-22 (×6): 3 mL via INTRAVENOUS

## 2024-01-18 MED ORDER — DILTIAZEM LOAD VIA INFUSION
20.0000 mg | Freq: Once | INTRAVENOUS | Status: AC
  Administered 2024-01-18: 20 mg via INTRAVENOUS
  Filled 2024-01-18: qty 20

## 2024-01-18 MED ORDER — MONTELUKAST SODIUM 10 MG PO TABS
10.0000 mg | ORAL_TABLET | Freq: Every day | ORAL | Status: DC
  Administered 2024-01-19: 10 mg via ORAL
  Filled 2024-01-18: qty 1

## 2024-01-18 MED ORDER — LORAZEPAM 2 MG/ML IJ SOLN
0.5000 mg | Freq: Once | INTRAMUSCULAR | Status: AC
  Administered 2024-01-18: 0.5 mg via INTRAVENOUS
  Filled 2024-01-18: qty 1

## 2024-01-18 MED ORDER — POLYETHYLENE GLYCOL 3350 17 G PO PACK: 17.0000 g | PACK | Freq: Every day | ORAL | Status: DC | PRN

## 2024-01-18 MED ORDER — ACETAMINOPHEN 325 MG PO TABS
650.0000 mg | ORAL_TABLET | Freq: Four times a day (QID) | ORAL | Status: DC | PRN
  Filled 2024-01-18: qty 2

## 2024-01-18 NOTE — ED Notes (Signed)
 Found patient trying to climb out of bed.  Repositioned patient, placed posey alarm on patient.  Patient made comfortable with bed table at bedside and call bell within reach.  Educated patient to call for assistance if needs help.

## 2024-01-18 NOTE — ED Notes (Signed)
 Pt has posey pad placed under her so staff is notified if pt is attempting to get out of bed

## 2024-01-18 NOTE — Progress Notes (Signed)
 Code status discussed with daughter with Dr. Jacinto Halim. Daughter gave consent to proceed with code status change to DNR/DNI. DNR/DNI order placed.

## 2024-01-18 NOTE — ED Notes (Signed)
 3walked patient to the bathroom patient did well patient is back in bed on the monitor with family at bedside

## 2024-01-18 NOTE — H&P (Addendum)
 History and Physical   Nicole [name] ZOX:[id number] DOB: 08-06-1935 DOA: 01/18/2024  PCP: Karie Georges, MD   Patient coming from: Home/facility  Chief Complaint: Shortness of breath  HPI: Nicole [name] is a 89 y.o. female with medical history significant of hypertension, hyperlipidemia, hypothyroidism, bronchiectasis, COPD, PE, diastolic CHF, mitral regurgitation, tricuspid regurgitation, aortic regurgitation, aortic stenosis presenting with worsening shortness of breath.  History obtained with assistance of chart review and family.  Patient here gradual decline.  Will continue to a nursing home from independent living several weeks ago.  Patient has had several weeks of intermittent fatigue and has had several days of worsening shortness of breath and cough.  Does have edema but is similar to previous.  No history of atrial fibrillation.  Patient denies specific complaints when seen.  Continues to have some confusion limiting full review of systems.  Cardiology has discussed patient's condition with family who states they do not want heroic measures or invasive interventions.  Patient has been made DNR/DNI.  ED Course: Vital signs in the ED notable for heart rate in the 140s-160s, blood pressure in the 120s-180 systolic, respiratory rate in the 20s-30s.  Saturating okay on room air.  Lab workup included CMP with glucose 1 1.  CBC with hemoglobin stable at 10.3.  Magnesium normal.  Lactic acid normal with repeat pending.  Lipase normal.  BNP elevated to 1319.  Troponin 25 with repeat pending.  Respiratory panel for flu COVID RSV pending.  Urinalysis with hemoglobin and protein only.  Blood cultures pending.  Chest x-ray pending.  CTA chest canceled due to patient agitation/inability to tolerate it.  Cardiology consulted and are seeing the patient.  Review of Systems: Full review of systems limited by patient confusion.  Past Medical History:  Diagnosis Date   Allergies    Asthma     Bronchiectasis (HCC) 07/16/2022   Chronic diastolic heart failure (HCC) 03/10/2023   Chronic obstructive lung disease (HCC) 03/24/2022   Glaucoma    History of pulmonary embolus (PE) 11/18/2022   Hyperlipidemia    Hypertension 03/20/2022    Past Surgical History:  Procedure Laterality Date   ABDOMINAL HYSTERECTOMY     1980   LAMINECTOMY     LOBECTOMY     1970    Social History  reports that she has never smoked. She has never used smokeless tobacco. She reports that she does not currently use alcohol. She reports that she does not use drugs.  Allergies  Allergen Reactions   Codeine Nausea Only and Other (See Comments)    Dizziness    Penicillins Other (See Comments)    Unknown    Family History  Problem Relation Age of Onset   Diabetes Mother    Heart disease Father    COPD Father    Heart attack Father    Cancer Sister   Reviewed on admission  Prior to Admission medications   Medication Sig Start Date End Date Taking? Authorizing Provider  acetaminophen (TYLENOL) 325 MG tablet Take 325 mg by mouth daily as needed for moderate pain or headache.    [provider]  albuterol (PROVENTIL) (2.5 MG/3ML) 0.083% nebulizer solution Take 3 mLs (2.5 mg total) by nebulization every 6 (six) hours as needed for wheezing or shortness of breath. 08/25/23   Karie Georges, MD  albuterol (VENTOLIN HFA) 108 (90 Base) MCG/ACT inhaler Inhale 2 puffs into the lungs every 6 (six) hours as needed for wheezing or shortness of breath. 07/21/23  Karie Georges, MD  atorvastatin (LIPITOR) 20 MG tablet Take 1 tablet (20 mg total) by mouth daily. 08/18/23   Karie Georges, MD  benzonatate (TESSALON) 100 MG capsule Take 1 capsule (100 mg total) by mouth 2 (two) times daily as needed for cough. 08/12/23   Karie Georges, MD  cholecalciferol (VITAMIN D3) 25 MCG (1000 UNIT) tablet Take 1 tablet (1,000 Units total) by mouth daily. Patient taking differently: Take 1,000 Units by  mouth daily. 09/24/23   Karie Georges, MD  Dextromethorphan-guaiFENesin [facility] DM MAXIMUM STRENGTH) 60-1200 MG TB12 Take 1 tablet by mouth 2 (two) times daily as needed. 11/02/23   Karie Georges, MD  fluticasone-salmeterol (ADVAIR) 250-50 MCG/ACT AEPB Inhale 1 puff into the lungs 2 (two) times daily. 03/31/23   Karie Georges, MD  HYDROCHLOROTHIAZIDE PO Take 12.5 mg by mouth. Every 3 days    [provider]  INCRUSE ELLIPTA 62.5 MCG/ACT AEPB Inhale 1 puff into the lungs daily. 07/21/23   Karie Georges, MD  latanoprost (XALATAN) 0.005 % ophthalmic solution INSTILL 1 DROP IN BOTH EYES AT BEDTIME Patient taking differently: Place 1 drop into both eyes at bedtime. 11/11/23   Karie Georges, MD  levocetirizine (XYZAL) 5 MG tablet Take 1 tablet (5 mg total) by mouth every evening. 08/18/23   Karie Georges, MD  levothyroxine (SYNTHROID) 75 MCG tablet Take 1 tablet (75 mcg total) by mouth daily. 07/21/23   Karie Georges, MD  lisinopril (ZESTRIL) 5 MG tablet Take 1 tablet (5 mg total) by mouth daily. 07/21/23 01/18/24  Karie Georges, MD  montelukast (SINGULAIR) 10 MG tablet TAKE 1 TABLET DAILY 12/21/23   Karie Georges, MD  Multiple Vitamin (MULTIVITAMIN WITH MINERALS) TABS tablet Take 1 tablet by mouth daily. 10/30/22   Sheikh, Omair Latif, DO  senna-docusate (SENOKOT-S) 8.6-50 MG tablet Take 1 tablet by mouth at bedtime. 10/30/22   Marguerita Merles [place], DO    Physical Exam: Vitals:   01/18/24 1215 01/18/24 1230 01/18/24 1300 01/18/24 1405  BP: (!) 145/118 (!) 155/100 (!) 140/94   Pulse: 75 (!) 56    Resp:  20 (!) 31   Temp:    98.6 F (37 C)  TempSrc:    Axillary  SpO2: 95% 96%    Weight:      Height:        Physical Exam Constitutional:      General: She is not in acute distress.    Appearance: Normal appearance.  HENT:     Head: Normocephalic and atraumatic.     Mouth/Throat:     Mouth: Mucous membranes are moist.     Pharynx: Oropharynx is  clear.  Eyes:     Extraocular Movements: Extraocular movements intact.     Pupils: Pupils are equal, round, and reactive to light.  Cardiovascular:     Rate and Rhythm: Tachycardia present. Rhythm irregular.     Pulses: Normal pulses.     Heart sounds: Normal heart sounds.  Pulmonary:     Effort: Pulmonary effort is normal. Tachypnea present. No respiratory distress.     Breath sounds: Rhonchi present.  Abdominal:     General: Bowel sounds are normal. There is no distension.     Palpations: Abdomen is soft.     Tenderness: There is no abdominal tenderness.  Musculoskeletal:        General: No swelling or deformity.  Skin:    General: Skin is warm and dry.  Neurological:     General: No focal deficit present.     Mental Status: Mental status is at baseline.    Labs on Admission: I have personally reviewed following labs and imaging studies  CBC: Recent Labs  Lab 01/18/24 1029  WBC 10.4  NEUTROABS 8.6*  HGB 10.3*  HCT 31.8*  MCV 93.3  PLT 268    Basic Metabolic Panel: Recent Labs  Lab 01/18/24 1029  NA 139  K 4.1  CL 101  CO2 26  GLUCOSE 101*  BUN 12  CREATININE 0.59  CALCIUM 9.5  MG 2.1    GFR: Estimated Creatinine Clearance: 31.3 mL/min (by C-G formula based on SCr of 0.59 mg/dL).  Liver Function Tests: Recent Labs  Lab 01/18/24 1029  AST 29  ALT 28  ALKPHOS 113  BILITOT 0.6  PROT 7.8  ALBUMIN 3.7    Urine analysis:    Component Value Date/Time   COLORURINE YELLOW 01/18/2024 1236   APPEARANCEUR CLEAR 01/18/2024 1236   LABSPEC 1.011 01/18/2024 1236   PHURINE 7.0 01/18/2024 1236   GLUCOSEU NEGATIVE 01/18/2024 1236   HGBUR MODERATE (A) 01/18/2024 1236   BILIRUBINUR NEGATIVE 01/18/2024 1236   KETONESUR NEGATIVE 01/18/2024 1236   PROTEINUR 30 (A) 01/18/2024 1236   NITRITE NEGATIVE 01/18/2024 1236   LEUKOCYTESUR NEGATIVE 01/18/2024 1236    Radiological Exams on Admission: No results found.  EKG: Independently reviewed.  Atrial  fibrillation with RVR at 144 bpm.  Evidence of right bundle blanch block.  Some baseline wander throughout.  Breakthrough PVC noted.  Assessment/Plan Principal Problem:   Atrial fibrillation with RVR (HCC) Active Problems:   Bronchiectasis (HCC)   Chronic diastolic heart failure (HCC)   Hypertension   Chronic obstructive lung disease (HCC)   History of pulmonary embolus (PE)   Hypothyroidism   Mixed hyperlipidemia   Atrial fibrillation with RVR > New atrial fibrillation.  Presenting with shortness of breath found to be in RVR to 140s-160s. > Patient has been taking Eliquis for history of pulmonary embolism. > Due to concern for worsening/systolic CHF likely would benefit from amiodarone for rate/rhythm control given patient is on anticoagulation already. > TSH normal in ED.  Electrolytes stable.  History of diastolic CHF and multiple valvular disorder as well as pulmonary disease. > Cardiology consulted in the ED. echo equal to be done as below. - Appreciate cardiology recommendations and assistance - Monitor in progressive unit - Continue home Eliquis - Cardiology recommends diltiazem infusion and pushes of metoprolol, patient's EF is newly reduced but not severely so per cardiology - Plan is for trial of intervention for the next 24 hours and if patient fails to respond plan is to transition to comfort care  Acute on chronic CHF MR, TR, AR, AS > History of chronic diastolic CHF last echo in 2023 showing EF 55-60%, G3 DD, normal RV function.  Echo also indicated moderate MR, moderate TR, mild AR, moderate AS. > Presenting today with onset A-fib with RVR and rates in the 140s-160s. > BNP 1319, suspect tachycardia mediated, > Echo able to be performed in the ED with EF 30-40%.  Likely near reduction is tachycardia mediated. - Appreciate cardiology recommendations - Strict I's and O's, daily weights - Echocardiogram - Hold off on diuresis for now  Confusion > Suspect delirium in  the setting of acute illness, no evidence of infection or electrolyte disturbance on initial workup.  TSH normal. > Patient unlikely to tolerate any neuroimaging given her degree of irritability without significant sedation  and no evidence of focal deficit at this time. > Possible aspiration as etiology.  And now  increased risk for aspiration with confusion. - Delirium precautions - Continue to monitor - Meds via IV when possible - Swallow study tomorrow if patient has not been made comfort care  Bronchiectasis COPD - Working to confirm home inhalers - As needed albuterol  History of pulmonary embolism - Continue Eliquis as above  Hypertension - Holding home antihypertensives while monitoring stability with RVR and new medications  Hyperlipidemia - Continue home atorvastatin   Hypothyroidism > TSH normal in ED - Continue home Synthroid  DVT prophylaxis: Eliquis Code Status:   DNR/DNI  Family Communication:  Not present when I examined patient, has had prolonged discussions with EDP and cardiology prior to my exam. Disposition Plan:   Patient is from:  Home  Anticipated DC to:  Home  Anticipated DC date:  1 to 4 days  Anticipated DC barriers: None  Consults called:  Cardiology Admission status:  Observation, progressive  Severity of Illness: The appropriate patient status for this patient is OBSERVATION. Observation status is judged to be reasonable and necessary in order to provide the required intensity of service to ensure the patient's safety. The patient's presenting symptoms, physical exam findings, and initial radiographic and laboratory data in the context of their medical condition is felt to place them at decreased risk for further clinical deterioration. Furthermore, it is anticipated that the patient will be medically stable for discharge from the hospital within 2 midnights of admission.    Synetta Fail MD [facility]  How to contact the [facility]  Attending or Consulting provider 7A - 7P or covering provider during after hours 7P -7A, for this patient?   Check the care team in [facility] and look for a) attending/consulting [facility] provider listed and b) the [facility] team listed Log into [email or web address] and use [place]'s universal password to access. If you do not have the password, please contact the hospital operator. Locate the [facility] provider you are looking for under [facility] and page to a number that you can be directly reached. If you still have difficulty reaching the provider, please page the [facility] (Director on Call) for the Hospitalists listed on amion for assistance.  01/18/2024, 2:28 PM

## 2024-01-18 NOTE — Consult Note (Addendum)
 Cardiology Consultation   Patient ID: Nicole [name] MRN: [id number]; DOB: 1935-08-18  Admit date: 01/18/2024 Date of Consult: 01/18/2024  PCP:  Karie Georges, MD   [facility] Providers Cardiologist:  [facility]: Penni Bombard MD Click here to update MD or APP on Care Team, Refresh:1}     Patient Profile:   Nicole [name] is a 89 y.o. female with a hx of Aortic stenosis, HFpEF, COPD, lower left lobectomy, prior PE on Eliquis, hypothyroidism who is being seen 01/18/2024 for the evaluation of Atrial fibrillation with RVR at the request of [facility].  History of Present Illness:   Ms. Brickey presented to the emergency department this morning for shortness of breath that has been present for one week. She reports increasing fatigue and lethargy during this time as well. She denied new dizziness, palpitations, or chest pain. She is at an assisted living facility at the moment which is who called 911 for her to be brought in for evaluation. Per daughter, patient was on a diuretic prior but has refused compliance due to nocturia. Patient has been complaint with the eliquis since January 2025 after she had a recurrent pulmonary emboli.   Past Medical History:  Diagnosis Date   Allergies    Asthma    Bronchiectasis (HCC) 07/16/2022   Chronic diastolic heart failure (HCC) 03/10/2023   Chronic obstructive lung disease (HCC) 03/24/2022   Glaucoma    History of pulmonary embolus (PE) 11/18/2022   Hyperlipidemia    Hypertension 03/20/2022    Past Surgical History:  Procedure Laterality Date   ABDOMINAL HYSTERECTOMY     1980   LAMINECTOMY     LOBECTOMY     1970     Home Medications:  Prior to Admission medications   Medication Sig Start Date End Date Taking? Authorizing Provider  acetaminophen (TYLENOL) 325 MG tablet Take 325 mg by mouth daily as needed for moderate pain or headache.    [provider]  albuterol (PROVENTIL) (2.5 MG/3ML) 0.083%  nebulizer solution Take 3 mLs (2.5 mg total) by nebulization every 6 (six) hours as needed for wheezing or shortness of breath. 08/25/23   Karie Georges, MD  albuterol (VENTOLIN HFA) 108 (90 Base) MCG/ACT inhaler Inhale 2 puffs into the lungs every 6 (six) hours as needed for wheezing or shortness of breath. 07/21/23   Karie Georges, MD  atorvastatin (LIPITOR) 20 MG tablet Take 1 tablet (20 mg total) by mouth daily. 08/18/23   Karie Georges, MD  benzonatate (TESSALON) 100 MG capsule Take 1 capsule (100 mg total) by mouth 2 (two) times daily as needed for cough. 08/12/23   Karie Georges, MD  cholecalciferol (VITAMIN D3) 25 MCG (1000 UNIT) tablet Take 1 tablet (1,000 Units total) by mouth daily. 09/24/23   Karie Georges, MD  Dextromethorphan-guaiFENesin [facility] DM MAXIMUM STRENGTH) 60-1200 MG TB12 Take 1 tablet by mouth 2 (two) times daily as needed. 11/02/23   Karie Georges, MD  fluticasone-salmeterol (ADVAIR) 250-50 MCG/ACT AEPB Inhale 1 puff into the lungs 2 (two) times daily. 03/31/23   Karie Georges, MD  HYDROCHLOROTHIAZIDE PO Take 12.5 mg by mouth. Every 3 days    [provider]  INCRUSE ELLIPTA 62.5 MCG/ACT AEPB Inhale 1 puff into the lungs daily. 07/21/23   Karie Georges, MD  latanoprost (XALATAN) 0.005 % ophthalmic solution INSTILL 1 DROP IN BOTH EYES AT BEDTIME 11/11/23   Karie Georges, MD  levocetirizine Elita Boone)  5 MG tablet Take 1 tablet (5 mg total) by mouth every evening. 08/18/23   Karie Georges, MD  levothyroxine (SYNTHROID) 75 MCG tablet Take 1 tablet (75 mcg total) by mouth daily. 07/21/23   Karie Georges, MD  lisinopril (ZESTRIL) 5 MG tablet Take 1 tablet (5 mg total) by mouth daily. 07/21/23 08/20/23  Karie Georges, MD  montelukast (SINGULAIR) 10 MG tablet TAKE 1 TABLET DAILY 12/21/23   Karie Georges, MD  Multiple Vitamin (MULTIVITAMIN WITH MINERALS) TABS tablet Take 1 tablet by mouth daily. 10/30/22   Sheikh, Omair Latif,  DO  senna-docusate (SENOKOT-S) 8.6-50 MG tablet Take 1 tablet by mouth at bedtime. 10/30/22   Merlene Laughter, DO    Inpatient Medications: Scheduled Meds:  apixaban  5 mg Oral BID   [START ON 01/19/2024] atorvastatin  20 mg Oral Daily   diltiazem  20 mg Intravenous Once   [START ON 01/19/2024] levothyroxine  75 mcg Oral Daily   metoprolol tartrate  5 mg Intravenous Q6H   [START ON 01/19/2024] montelukast  10 mg Oral Daily   sodium chloride flush  3 mL Intravenous Q12H    Allergies:    Allergies  Allergen Reactions   Codeine Nausea Only and Other (See Comments)    Dizziness    Penicillins Other (See Comments)    Unknown    Social History:   Social History   Socioeconomic History   Marital status: Widowed    Spouse name: Not on file   Number of children: Not on file   Years of education: Not on file   Highest education level: 9th grade  Occupational History   Not on file  Tobacco Use   Smoking status: Never   Smokeless tobacco: Never  Vaping Use   Vaping status: Never Used  Substance and Sexual Activity   Alcohol use: Not Currently   Drug use: Never   Sexual activity: Not Currently  Other Topics Concern   Not on file  Social History Narrative   Not on file   Social Drivers of Health   Financial Resource Strain: Low Risk  (07/20/2023)   Overall Financial Resource Strain (CARDIA)    Difficulty of Paying Living Expenses: Not hard at all  Food Insecurity: No Food Insecurity (01/18/2024)   Hunger Vital Sign    Worried About Running Out of Food in the Last Year: Never true    Ran Out of Food in the Last Year: Never true  Transportation Needs: No Transportation Needs (01/18/2024)   PRAPARE - Administrator, Civil Service (Medical): No    Lack of Transportation (Non-Medical): No  Physical Activity: Insufficiently Active (07/20/2023)   Exercise Vital Sign    Days of Exercise per Week: 5 days    Minutes of Exercise per Session: 20 min  Stress: No Stress  Concern Present (11/17/2023)   Received from [facility] of Occupational Health - Occupational Stress Questionnaire    Feeling of Stress : Not at all  Social Connections: Socially Isolated (01/18/2024)   Social Connection and Isolation Panel [NHANES]    Frequency of Communication with Friends and Family: More than three times a week    Frequency of Social Gatherings with Friends and Family: More than three times a week    Attends Religious Services: Never    Database administrator or Organizations: No    Attends Banker Meetings: Never    Marital Status: Widowed  Intimate  Partner Violence: Not At Risk (01/18/2024)   Humiliation, Afraid, Rape, and Kick questionnaire    Fear of Current or Ex-Partner: No    Emotionally Abused: No    Physically Abused: No    Sexually Abused: No    Family History:    Family History  Problem Relation Age of Onset   Diabetes Mother    Heart disease Father    COPD Father    Heart attack Father    Cancer Sister      ROS:  Please see the history of present illness.  All other ROS reviewed and negative.     Physical Exam/Data:   Vitals:   01/18/24 1215 01/18/24 1230 01/18/24 1300 01/18/24 1405  BP: (!) 145/118 (!) 155/100 (!) 140/94   Pulse: 75 (!) 56    Resp:  20 (!) 31   Temp:    98.6 F (37 C)  TempSrc:    Axillary  SpO2: 95% 96%    Weight:      Height:       No intake or output data in the 24 hours ending 01/18/24 1542    01/18/2024    9:06 AM 10/17/2023    8:10 PM 08/18/2023   12:55 PM  Last 3 Weights  Weight (lbs) 88 lb 2.9 oz 86 lb 6.7 oz 86 lb 8 oz  Weight (kg) 40 kg 39.2 kg 39.236 kg     Body mass index is 17.22 kg/m.  General:  Elderly thin female laying in bed with daughter at bedside, ill appearing  Vascular: Distal pulses 2+ bilaterally Cardiac:  tachycardia, regular rhythm on exam, unable to appreciate murmurs due to diffuse rhonchi bilaterally  Lungs:  Tachypnea, rhonchi auscultated  diffusely, respiratory distress,  respiratory accessory muscles in use, coughing throughout exam Abd: soft, non-tender Ext: trace edema present L>R LE Skin: warm and dry  Psych:  Overt confusion present on exam with occasional mumbling   EKG:  The EKG was personally reviewed and demonstrates:  Attial fibrillation with RVR at a rate of 144, RBBB, RAD present  Telemetry:  Telemetry was personally reviewed and demonstrates:  Sinus tachycardia   Relevant CV Studies: Echocardiogram 09/2022: LVEF 55-60%, Grade III diastolic dysfunction, bilateral artrial enlargement, moderate aortic stenosis, mild AI, TR mild-moderate, MR mild to moderate, MS mild   Laboratory Data:  High Sensitivity Troponin:   Recent Labs  Lab 01/18/24 1029  TROPONINIHS 25*     Chemistry Recent Labs  Lab 01/18/24 1029  NA 139  K 4.1  CL 101  CO2 26  GLUCOSE 101*  BUN 12  CREATININE 0.59  CALCIUM 9.5  MG 2.1  GFRNONAA >60  ANIONGAP 12    Recent Labs  Lab 01/18/24 1029  PROT 7.8  ALBUMIN 3.7  AST 29  ALT 28  ALKPHOS 113  BILITOT 0.6   Hematology Recent Labs  Lab 01/18/24 1029  WBC 10.4  RBC 3.41*  HGB 10.3*  HCT 31.8*  MCV 93.3  MCH 30.2  MCHC 32.4  RDW 17.5*  PLT 268   Thyroid  Recent Labs  Lab 01/18/24 1029  TSH 4.127    BNP Recent Labs  Lab 01/18/24 1029  BNP 1,319.6*    Radiology/Studies:  No results found.   Assessment and Plan:   Atrial Fibrillation with RVR: Patient presents with new atrial fibrillation with RVR. She is currently not on any rate controlling medications at this time. On exam, she is tachycardic with an EKG showing Afib with RVR. I  suspect that her Afib with RVR is due to an acute pulmonary disease at this time, as she is in respiratory distress during exam with diffuse rhonchi. We spoke with the patient's daughter and will begin treatment and see how the patient responds over the next day.  Plan: -Will start patient on Cardizem drip -Lopressor 5mg   injection every 6 hours  -Obtain Echocardiogram -Restart patient's home eliquis, will address AC with primary team due to suspicion of aspiration.  -Continue telemetry  Pulmonary disease Bronchiectasis s/p Left Lower Lobectomy COPD I suspect the patient's lung exam findings including inspiratory rhonchi are due to lung pathology and not volume overload. Will defer to primary team, high suspicion for aspiration pneumonia at this time with the respiratory distress.   HFpEF Aortic Stenosis  Patient appears mildly volume overloaded with trace lower extremity edema and BNP is elevated at 1,319.6, CXR was performed but the read is not finalized at this time. She is not on GDMT at this time.  -Will hold off on diuresis, patient does not appear overtly volume overloaded at this time  -Order echocardiogram -Daily standing weight -Strict ins and outs    Troponin elevation: Initial troponin was mildly elevated at 25, likely due to Afib with RVR  -Will obtain additional troponin  Risk Assessment/Risk Scores:   CHA2DS2-VASc Score =   5 points      For questions or updates, please contact [facility] Please consult [email or web address] for contact info under    Signed, Faith Rogue, DO  01/18/2024 3:42 PM

## 2024-01-18 NOTE — ED Notes (Signed)
 Pt is confused and removed IV. Provider informed.Family bedside.

## 2024-01-18 NOTE — ED Notes (Signed)
 Got patient undressed into a gown on the monitor did EKG shown to Dr Tegeler patient is resting with call bell in reach

## 2024-01-18 NOTE — ED Provider Notes (Signed)
 [place] EMERGENCY DEPARTMENT AT [facility] Provider Note   CSN: [id number] Arrival date & time: 01/18/24  6387     History  Chief Complaint  Patient presents with   Shortness of Breath    Nicole [name] is a 89 y.o. female.  The history is provided by the patient and medical records. No language interpreter was used.  Shortness of Breath Severity:  Moderate Onset quality:  Gradual Duration:  2 days Timing:  Intermittent Progression:  Waxing and waning Context: URI (cough)   Relieved by:  Nothing Worsened by:  Coughing Associated symptoms: cough   Associated symptoms: no abdominal pain, no chest pain, no fever, no headaches, no hemoptysis, no neck pain, no sputum production, no vomiting and no wheezing   Risk factors: hx of PE/DVT        Home Medications Prior to Admission medications   Medication Sig Start Date End Date Taking? Authorizing Provider  acetaminophen (TYLENOL) 325 MG tablet Take 325 mg by mouth daily as needed for moderate pain or headache.    [provider]  albuterol (PROVENTIL) (2.5 MG/3ML) 0.083% nebulizer solution Take 3 mLs (2.5 mg total) by nebulization every 6 (six) hours as needed for wheezing or shortness of breath. 08/25/23   Karie Georges, MD  albuterol (VENTOLIN HFA) 108 (90 Base) MCG/ACT inhaler Inhale 2 puffs into the lungs every 6 (six) hours as needed for wheezing or shortness of breath. 07/21/23   Karie Georges, MD  atorvastatin (LIPITOR) 20 MG tablet Take 1 tablet (20 mg total) by mouth daily. 08/18/23   Karie Georges, MD  benzonatate (TESSALON) 100 MG capsule Take 1 capsule (100 mg total) by mouth 2 (two) times daily as needed for cough. 08/12/23   Karie Georges, MD  cholecalciferol (VITAMIN D3) 25 MCG (1000 UNIT) tablet Take 1 tablet (1,000 Units total) by mouth daily. 09/24/23   Karie Georges, MD  Dextromethorphan-guaiFENesin [facility] DM MAXIMUM STRENGTH) 60-1200 MG TB12 Take 1 tablet by mouth 2  (two) times daily as needed. 11/02/23   Karie Georges, MD  fluticasone-salmeterol (ADVAIR) 250-50 MCG/ACT AEPB Inhale 1 puff into the lungs 2 (two) times daily. 03/31/23   Karie Georges, MD  HYDROCHLOROTHIAZIDE PO Take 12.5 mg by mouth. Every 3 days    [provider]  INCRUSE ELLIPTA 62.5 MCG/ACT AEPB Inhale 1 puff into the lungs daily. 07/21/23   Karie Georges, MD  latanoprost (XALATAN) 0.005 % ophthalmic solution INSTILL 1 DROP IN BOTH EYES AT BEDTIME 11/11/23   Karie Georges, MD  levocetirizine (XYZAL) 5 MG tablet Take 1 tablet (5 mg total) by mouth every evening. 08/18/23   Karie Georges, MD  levothyroxine (SYNTHROID) 75 MCG tablet Take 1 tablet (75 mcg total) by mouth daily. 07/21/23   Karie Georges, MD  lisinopril (ZESTRIL) 5 MG tablet Take 1 tablet (5 mg total) by mouth daily. 07/21/23 08/20/23  Karie Georges, MD  montelukast (SINGULAIR) 10 MG tablet TAKE 1 TABLET DAILY 12/21/23   Karie Georges, MD  Multiple Vitamin (MULTIVITAMIN WITH MINERALS) TABS tablet Take 1 tablet by mouth daily. 10/30/22   Sheikh, Omair Latif, DO  senna-docusate (SENOKOT-S) 8.6-50 MG tablet Take 1 tablet by mouth at bedtime. 10/30/22   Marguerita Merles Latif, DO      Allergies    Codeine and Penicillins    Review of Systems   Review of Systems  Constitutional:  Positive for fatigue. Negative for chills and  fever.  HENT:  Negative for congestion.   Eyes:  Negative for visual disturbance.  Respiratory:  Positive for cough, chest tightness and shortness of breath. Negative for hemoptysis, sputum production and wheezing.   Cardiovascular:  Positive for leg swelling (chronci per pt). Negative for chest pain and palpitations.  Gastrointestinal:  Negative for abdominal pain, constipation, diarrhea, nausea and vomiting.  Genitourinary:  Negative for dysuria.  Musculoskeletal:  Negative for back pain and neck pain.  Neurological:  Negative for headaches.  Psychiatric/Behavioral:   Negative for agitation and confusion.   All other systems reviewed and are negative.   Physical Exam Updated Vital Signs There were no vitals taken for this visit. Physical Exam Vitals and nursing note reviewed.  Constitutional:      General: She is not in acute distress.    Appearance: She is well-developed. She is not ill-appearing, toxic-appearing or diaphoretic.  HENT:     Head: Normocephalic and atraumatic.  Eyes:     Extraocular Movements: Extraocular movements intact.     Conjunctiva/sclera: Conjunctivae normal.     Pupils: Pupils are equal, round, and reactive to light.  Cardiovascular:     Rate and Rhythm: Tachycardia present. Rhythm irregular.     Heart sounds: No murmur heard. Pulmonary:     Effort: Tachypnea present. No respiratory distress.     Breath sounds: No stridor. Rhonchi and rales present. No decreased breath sounds.  Chest:     Chest wall: No tenderness.  Abdominal:     Palpations: Abdomen is soft.     Tenderness: There is no abdominal tenderness.  Musculoskeletal:        General: No swelling.     Cervical back: Neck supple.     Right lower leg: Edema present.     Left lower leg: Edema present.  Skin:    General: Skin is warm and dry.     Capillary Refill: Capillary refill takes less than 2 seconds.  Neurological:     Mental Status: She is alert.  Psychiatric:        Mood and Affect: Mood normal.     ED Results / Procedures / Treatments   Labs (all labs ordered are listed, but only abnormal results are displayed) Labs Reviewed  CBC WITH DIFFERENTIAL/PLATELET - Abnormal; Notable for the following components:      Result Value   RBC 3.41 (*)    Hemoglobin 10.3 (*)    HCT 31.8 (*)    RDW 17.5 (*)    Neutro Abs 8.6 (*)    All other components within normal limits  COMPREHENSIVE METABOLIC PANEL WITH GFR - Abnormal; Notable for the following components:   Glucose, Bld 101 (*)    All other components within normal limits  URINALYSIS, W/ REFLEX  TO CULTURE (INFECTION SUSPECTED) - Abnormal; Notable for the following components:   Hgb urine dipstick MODERATE (*)    Protein, ur 30 (*)    Bacteria, UA RARE (*)    All other components within normal limits  BRAIN NATRIURETIC PEPTIDE - Abnormal; Notable for the following components:   B Natriuretic Peptide 1,319.6 (*)    All other components within normal limits  TROPONIN I (HIGH SENSITIVITY) - Abnormal; Notable for the following components:   Troponin I (High Sensitivity) 25 (*)    All other components within normal limits  CULTURE, BLOOD (ROUTINE X 2)  CULTURE, BLOOD (ROUTINE X 2)  RESP PANEL BY RT-PCR (RSV, FLU A&B, COVID)  RVPGX2  LACTIC ACID, PLASMA  LIPASE, BLOOD  MAGNESIUM  TSH  LACTIC ACID, PLASMA  COMPREHENSIVE METABOLIC PANEL WITH GFR  CBC  TROPONIN I (HIGH SENSITIVITY)    EKG EKG Interpretation Date/Time:  Monday January 18 2024 09:02:56 EDT Ventricular Rate:  144 PR Interval:    QRS Duration:  117 QT Interval:  328 QTC Calculation: 488 R Axis:   91  Text Interpretation: Atrial fibrillation with rapid V-rate RBBB and LPFB Artifact in lead(s) V5 when comapred to prior, now afib with RVR. No STEMI Confirmed by Theda Belfast ([id number]) on 01/18/2024 9:04:46 AM  Radiology DG Chest Portable 1 View Result Date: 01/18/2024 CLINICAL DATA:  Cough, shortness of breath, fatigue, atrial fibrillation and right arm pain. EXAM: PORTABLE CHEST 1 VIEW COMPARISON:  10/17/2023 FINDINGS: Stable mildly enlarged cardiac silhouette. Interval patchy density at the right lung base with less prominent patchy density at the left lung base. Small bilateral pleural effusions. Mild increase in prominence of the interstitial markings with some Kerley lines. Mild increase in prominence of the pulmonary vasculature. Diffuse osteopenia. IMPRESSION: 1. Mild congestive heart failure. 2. Bibasilar atelectasis, pneumonia or alveolar edema, new on the right and less prominent on the left. 3. Small bilateral  pleural effusions. Electronically Signed   By: Beckie Salts M.D.   On: 01/18/2024 15:45    Procedures Procedures    Medications Ordered in ED Medications  atorvastatin (LIPITOR) tablet 20 mg (has no administration in time range)  levothyroxine (SYNTHROID) tablet 75 mcg (has no administration in time range)  montelukast (SINGULAIR) tablet 10 mg (has no administration in time range)  albuterol (PROVENTIL) (2.5 MG/3ML) 0.083% nebulizer solution 2.5 mg (has no administration in time range)  sodium chloride flush (NS) 0.9 % injection 3 mL (3 mLs Intravenous Given 01/18/24 1659)  acetaminophen (TYLENOL) tablet 650 mg (has no administration in time range)    Or  acetaminophen (TYLENOL) suppository 650 mg (has no administration in time range)  polyethylene glycol (MIRALAX / GLYCOLAX) packet 17 g (has no administration in time range)  diltiazem (CARDIZEM) 1 mg/mL load via infusion 20 mg (20 mg Intravenous Bolus from Bag 01/18/24 1659)    And  diltiazem (CARDIZEM) 125 mg in dextrose 5% 125 mL (1 mg/mL) infusion (5 mg/hr Intravenous New Bag/Given 01/18/24 1657)  metoprolol tartrate (LOPRESSOR) injection 5 mg (has no administration in time range)  apixaban (ELIQUIS) tablet 2.5 mg (has no administration in time range)  LORazepam (ATIVAN) injection 0.5 mg (0.5 mg Intravenous Given 01/18/24 1352)    ED Course/ Medical Decision Making/ A&P                                 Medical Decision Making Amount and/or Complexity of Data Reviewed Labs: ordered. Radiology: ordered.  Risk Prescription drug management. Decision regarding hospitalization.    NESHIA [name] is a 89 y.o. female with a past medical history significant for CHF, COPD, asthma, previous pulmonary embolism on Eliquis therapy, hypertension, hyperlipidemia, glaucoma, and previous anemia who presents with several weeks of intermittent fatigue with worsening shortness of breath and cough for the last few days.  According to patient, she  has had some fatigue on and off for the last few weeks but denies any chest pain or palpitations.  She denies nausea, vomiting, constipation, diarrhea, or urinary changes.  Does report edema in her legs this is similar to prior.  She has no history of A-fib or a flutter but was told this  morning that she was in A-fib.  She denies any trauma and denies any feeling of her heart racing.  She does report some shortness of breath that comes and goes and has this cough.  She denies any hemoptysis.  She has had some lightheadedness at times but no syncopal episodes reported.  On exam, lungs had rales and rhonchi predominantly in the bases.  She did not have wheezing but she reportedly had a DuoNeb with EMS on the way here.  She had good pulses in extremities but legs are edematous bilaterally.  Chest and abdomen nontender.  No evidence of trauma.  No rashes seen.  Patient resting comfortably despite her heart rate going about 150 and what is A-fib with RVR and occasionally look like a flutter.  EKG does not show STEMI but does show the A-fib with RVR.  Given her short of breath and tachycardia I am somewhat concerned that we need to rule out new pulmonary emboli despite her taking her Eliquis.  I also feel we need to rule out pneumonia and other infectious symptoms we will get a COVID swab.  Will get a rectal temp as she did feel warm to me.  Will get screening labs including a BNP as her legs are edematous and she had rales in her lungs.  Given her heart failure, will be careful about medications to help rate control her so anticipate discussion with cardiology to discuss best management plan but I anticipate admission given the patient's vital signs and appearance on arrival.  Will see what workup reveals and then call cardiology for some guidance and management given her new rhythm.   Patient reportedly was refusing workup.  Will await family to arrive and have a discussion about management.  10:32  AM Daughter and family has now arrived and daughter showed paperwork that she is a healthcare 5 attorney.  Daughter does want her to get the workup to figure out why her heart rate is going fast and why she has been having these short of breath and fatigue.  Daughter says we can do anything we need to do to get her workup completed and daughter agrees with her heart rate in the 150s she likely would not be appropriate to go back to the same facility.  Anticipate admission after workup is completed.  Patient needs some medication help her relax we may need to do that as well.  12:58 PM Workup continues to return.  Heart rate is still in the 140s to 150s with A-fib with RVR.  Troponin is elevated compared to prior, BNP is much higher at 1300 compared to prior.  Other labs were relatively reassuring.  She does not have a UTI.  She initially was refusing a chest x-ray however daughter once again confirmed she is the healthcare by returning and would like her to get the care she needs.  Thus we will try to get her portable chest x-ray and if she refuses, we and family agree with giving her some medicine either oral or V/IM to get the appropriate imaging.  Will go ahead and call cardiology and medicine for admission.  We agree with family to discontinue the CT scan but will try to get the portable chest x-ray after giving her some medicine to help her relax.         Final Clinical Impression(s) / ED Diagnoses Final diagnoses:  Atrial fibrillation with RVR (HCC)  New onset a-fib (HCC)  Shortness of breath  Tachycardia  Elevated  brain natriuretic peptide (BNP) level     Clinical Impression: 1. Atrial fibrillation with RVR (HCC)   2. New onset a-fib (HCC)   3. Shortness of breath   4. Tachycardia   5. Elevated brain natriuretic peptide (BNP) level     Disposition: Admit  This note was prepared with assistance of Dragon voice recognition software. Occasional wrong-word or sound-a-like  substitutions may have occurred due to the inherent limitations of voice recognition software.     Simisola Sandles, Canary Brim, MD 01/18/24 [phone]

## 2024-01-18 NOTE — ED Triage Notes (Signed)
 PT BIB [facility] from [facility] for SOB onset beginning yesterday with cough, PT has been in Afib with HR between 120/150. Received duoneb en route. PT also endorses 5/10 R arm pain. Aox4. [facility] BP 140/90, 98% on room air.

## 2024-01-19 ENCOUNTER — Observation Stay (HOSPITAL_COMMUNITY)

## 2024-01-19 DIAGNOSIS — I110 Hypertensive heart disease with heart failure: Secondary | ICD-10-CM | POA: Diagnosis present

## 2024-01-19 DIAGNOSIS — F0390 Unspecified dementia without behavioral disturbance: Secondary | ICD-10-CM | POA: Diagnosis present

## 2024-01-19 DIAGNOSIS — G9341 Metabolic encephalopathy: Secondary | ICD-10-CM | POA: Diagnosis present

## 2024-01-19 DIAGNOSIS — I4891 Unspecified atrial fibrillation: Secondary | ICD-10-CM | POA: Diagnosis not present

## 2024-01-19 DIAGNOSIS — J470 Bronchiectasis with acute lower respiratory infection: Secondary | ICD-10-CM | POA: Diagnosis present

## 2024-01-19 DIAGNOSIS — J440 Chronic obstructive pulmonary disease with acute lower respiratory infection: Secondary | ICD-10-CM | POA: Diagnosis present

## 2024-01-19 DIAGNOSIS — J479 Bronchiectasis, uncomplicated: Secondary | ICD-10-CM | POA: Diagnosis not present

## 2024-01-19 DIAGNOSIS — I5043 Acute on chronic combined systolic (congestive) and diastolic (congestive) heart failure: Secondary | ICD-10-CM

## 2024-01-19 DIAGNOSIS — R0602 Shortness of breath: Secondary | ICD-10-CM | POA: Diagnosis present

## 2024-01-19 DIAGNOSIS — E039 Hypothyroidism, unspecified: Secondary | ICD-10-CM | POA: Diagnosis present

## 2024-01-19 DIAGNOSIS — J121 Respiratory syncytial virus pneumonia: Secondary | ICD-10-CM | POA: Diagnosis present

## 2024-01-19 DIAGNOSIS — J441 Chronic obstructive pulmonary disease with (acute) exacerbation: Secondary | ICD-10-CM | POA: Diagnosis present

## 2024-01-19 DIAGNOSIS — B338 Other specified viral diseases: Secondary | ICD-10-CM | POA: Diagnosis not present

## 2024-01-19 DIAGNOSIS — J189 Pneumonia, unspecified organism: Secondary | ICD-10-CM | POA: Diagnosis not present

## 2024-01-19 DIAGNOSIS — I5032 Chronic diastolic (congestive) heart failure: Secondary | ICD-10-CM | POA: Diagnosis not present

## 2024-01-19 DIAGNOSIS — Z1152 Encounter for screening for COVID-19: Secondary | ICD-10-CM | POA: Diagnosis not present

## 2024-01-19 DIAGNOSIS — J9601 Acute respiratory failure with hypoxia: Secondary | ICD-10-CM

## 2024-01-19 DIAGNOSIS — D649 Anemia, unspecified: Secondary | ICD-10-CM | POA: Diagnosis present

## 2024-01-19 DIAGNOSIS — I350 Nonrheumatic aortic (valve) stenosis: Secondary | ICD-10-CM | POA: Diagnosis present

## 2024-01-19 DIAGNOSIS — R64 Cachexia: Secondary | ICD-10-CM | POA: Diagnosis present

## 2024-01-19 DIAGNOSIS — E782 Mixed hyperlipidemia: Secondary | ICD-10-CM | POA: Diagnosis present

## 2024-01-19 DIAGNOSIS — Z7901 Long term (current) use of anticoagulants: Secondary | ICD-10-CM | POA: Diagnosis not present

## 2024-01-19 DIAGNOSIS — Z66 Do not resuscitate: Secondary | ICD-10-CM | POA: Diagnosis present

## 2024-01-19 DIAGNOSIS — E8809 Other disorders of plasma-protein metabolism, not elsewhere classified: Secondary | ICD-10-CM | POA: Diagnosis present

## 2024-01-19 DIAGNOSIS — Z7951 Long term (current) use of inhaled steroids: Secondary | ICD-10-CM | POA: Diagnosis not present

## 2024-01-19 DIAGNOSIS — Z79890 Hormone replacement therapy (postmenopausal): Secondary | ICD-10-CM | POA: Diagnosis not present

## 2024-01-19 DIAGNOSIS — Z515 Encounter for palliative care: Secondary | ICD-10-CM | POA: Diagnosis not present

## 2024-01-19 DIAGNOSIS — Z681 Body mass index (BMI) 19 or less, adult: Secondary | ICD-10-CM | POA: Diagnosis not present

## 2024-01-19 DIAGNOSIS — Z7189 Other specified counseling: Secondary | ICD-10-CM | POA: Diagnosis not present

## 2024-01-19 DIAGNOSIS — R627 Adult failure to thrive: Secondary | ICD-10-CM | POA: Diagnosis present

## 2024-01-19 DIAGNOSIS — I5031 Acute diastolic (congestive) heart failure: Secondary | ICD-10-CM | POA: Diagnosis not present

## 2024-01-19 DIAGNOSIS — I480 Paroxysmal atrial fibrillation: Secondary | ICD-10-CM | POA: Diagnosis present

## 2024-01-19 LAB — CBC
HCT: 35 % — ABNORMAL LOW (ref 36.0–46.0)
Hemoglobin: 11.4 g/dL — ABNORMAL LOW (ref 12.0–15.0)
MCH: 30.5 pg (ref 26.0–34.0)
MCHC: 32.6 g/dL (ref 30.0–36.0)
MCV: 93.6 fL (ref 80.0–100.0)
Platelets: 281 10*3/uL (ref 150–400)
RBC: 3.74 MIL/uL — ABNORMAL LOW (ref 3.87–5.11)
RDW: 17 % — ABNORMAL HIGH (ref 11.5–15.5)
WBC: 12.4 10*3/uL — ABNORMAL HIGH (ref 4.0–10.5)
nRBC: 0 % (ref 0.0–0.2)

## 2024-01-19 LAB — COMPREHENSIVE METABOLIC PANEL WITH GFR
ALT: 29 U/L (ref 0–44)
AST: 32 U/L (ref 15–41)
Albumin: 3.5 g/dL (ref 3.5–5.0)
Alkaline Phosphatase: 109 U/L (ref 38–126)
Anion gap: 16 — ABNORMAL HIGH (ref 5–15)
BUN: 22 mg/dL (ref 8–23)
CO2: 25 mmol/L (ref 22–32)
Calcium: 9.2 mg/dL (ref 8.9–10.3)
Chloride: 99 mmol/L (ref 98–111)
Creatinine, Ser: 0.8 mg/dL (ref 0.44–1.00)
GFR, Estimated: >60 mL/min (ref >60)
Glucose, Bld: 143 mg/dL — ABNORMAL HIGH (ref 70–99)
Potassium: 4.4 mmol/L (ref 3.5–5.1)
Sodium: 140 mmol/L (ref 135–145)
Total Bilirubin: 1 mg/dL (ref 0.0–1.2)
Total Protein: 7.1 g/dL (ref 6.5–8.1)

## 2024-01-19 LAB — RESPIRATORY PANEL BY PCR

## 2024-01-19 LAB — BLOOD GAS, VENOUS
Acid-Base Excess: 1.6 mmol/L (ref 0.0–2.0)
Bicarbonate: 29.8 mmol/L — ABNORMAL HIGH (ref 20.0–28.0)
O2 Saturation: 69.9 %
Patient temperature: 37
pCO2, Ven: 62 mmHg — ABNORMAL HIGH (ref 44–60)
pH, Ven: 7.29 (ref 7.25–7.43)
pO2, Ven: 49 mmHg — ABNORMAL HIGH (ref 32–45)

## 2024-01-19 LAB — RESP PANEL BY RT-PCR (RSV, FLU A&B, COVID)  RVPGX2
Influenza A by PCR: NEGATIVE
Influenza B by PCR: NEGATIVE
Resp Syncytial Virus by PCR: POSITIVE — AB
SARS Coronavirus 2 by RT PCR: NEGATIVE

## 2024-01-19 LAB — MRSA NEXT GEN BY PCR, NASAL: MRSA by PCR Next Gen: NOT DETECTED

## 2024-01-19 LAB — STREP PNEUMONIAE URINARY ANTIGEN: Strep Pneumo Urinary Antigen: NEGATIVE

## 2024-01-19 LAB — PROCALCITONIN: Procalcitonin: 0.69 ng/mL

## 2024-01-19 LAB — LACTIC ACID, PLASMA: Lactic Acid, Venous: 1.8 mmol/L (ref 0.5–1.9)

## 2024-01-19 MED ORDER — METOPROLOL TARTRATE 25 MG PO TABS
25.0000 mg | ORAL_TABLET | Freq: Two times a day (BID) | ORAL | Status: DC
  Administered 2024-01-19 – 2024-01-20 (×3): 25 mg via ORAL
  Filled 2024-01-19 (×3): qty 1

## 2024-01-19 MED ORDER — LORAZEPAM 2 MG/ML IJ SOLN
0.5000 mg | INTRAMUSCULAR | Status: DC | PRN
  Administered 2024-01-19 – 2024-01-20 (×3): 0.5 mg via INTRAVENOUS
  Filled 2024-01-19 (×3): qty 1

## 2024-01-19 MED ORDER — CEFTRIAXONE SODIUM 2 G IJ SOLR
2.0000 g | INTRAMUSCULAR | Status: DC
  Administered 2024-01-20: 2 g via INTRAVENOUS
  Filled 2024-01-19: qty 20

## 2024-01-19 MED ORDER — FUROSEMIDE 10 MG/ML IJ SOLN
60.0000 mg | INTRAMUSCULAR | Status: AC
  Administered 2024-01-19: 60 mg via INTRAVENOUS
  Filled 2024-01-19: qty 6

## 2024-01-19 MED ORDER — ALBUMIN HUMAN 25 % IV SOLN
50.0000 g | INTRAVENOUS | Status: AC
  Administered 2024-01-19: 50 g via INTRAVENOUS
  Filled 2024-01-19: qty 200

## 2024-01-19 MED ORDER — SODIUM CHLORIDE 0.9 % IV SOLN
2.0000 g | Freq: Once | INTRAVENOUS | Status: AC
  Administered 2024-01-19: 2 g via INTRAVENOUS
  Filled 2024-01-19: qty 20

## 2024-01-19 MED ORDER — SODIUM CHLORIDE 0.9 % IV BOLUS: 250.0000 mL | INTRAVENOUS | Status: DC

## 2024-01-19 MED ORDER — METHYLPREDNISOLONE SODIUM SUCC 125 MG IJ SOLR
125.0000 mg | Freq: Once | INTRAMUSCULAR | Status: AC
  Administered 2024-01-19: 125 mg via INTRAVENOUS
  Filled 2024-01-19: qty 2

## 2024-01-19 MED ORDER — FUROSEMIDE 10 MG/ML IJ SOLN: 40.0000 mg | Freq: Two times a day (BID) | INTRAMUSCULAR | Status: DC

## 2024-01-19 MED ORDER — ACETYLCYSTEINE 20 % IN SOLN
3.0000 mL | Freq: Two times a day (BID) | RESPIRATORY_TRACT | Status: DC
  Administered 2024-01-19 – 2024-01-20 (×3): 3 mL via RESPIRATORY_TRACT
  Filled 2024-01-19 (×4): qty 4

## 2024-01-19 MED ORDER — METOPROLOL TARTRATE 5 MG/5ML IV SOLN: 5.0000 mg | Freq: Four times a day (QID) | INTRAVENOUS | Status: DC

## 2024-01-19 MED ORDER — LEVALBUTEROL HCL 0.63 MG/3ML IN NEBU
0.6300 mg | INHALATION_SOLUTION | Freq: Four times a day (QID) | RESPIRATORY_TRACT | Status: DC | PRN
  Administered 2024-01-20: 0.63 mg via RESPIRATORY_TRACT
  Filled 2024-01-19 (×2): qty 3

## 2024-01-19 MED ORDER — IPRATROPIUM-ALBUTEROL 0.5-2.5 (3) MG/3ML IN SOLN
3.0000 mL | Freq: Four times a day (QID) | RESPIRATORY_TRACT | Status: DC
  Administered 2024-01-19 – 2024-01-20 (×5): 3 mL via RESPIRATORY_TRACT
  Filled 2024-01-19 (×4): qty 3

## 2024-01-19 MED ORDER — SODIUM CHLORIDE 0.9 % IV SOLN
500.0000 mg | INTRAVENOUS | Status: DC
  Administered 2024-01-19 – 2024-01-20 (×2): 500 mg via INTRAVENOUS
  Filled 2024-01-19 (×2): qty 5

## 2024-01-19 MED ORDER — METHYLPREDNISOLONE SODIUM SUCC 40 MG IJ SOLR
40.0000 mg | Freq: Every day | INTRAMUSCULAR | Status: DC
  Filled 2024-01-19: qty 1

## 2024-01-19 NOTE — Progress Notes (Addendum)
 [facility] PROGRESS NOTE   Nicole [name] ZOX:[id number] DOB: 21-Apr-1935 DOA: 01/18/2024  PCP: Karie Georges, MD  Brief History: 89 y.o. female with medical history significant of hypertension, hyperlipidemia, hypothyroidism, bronchiectasis, COPD, PE, diastolic CHF, mitral regurgitation, tricuspid regurgitation, aortic regurgitation, aortic stenosis presented with worsening shortness of breath.  Was noted to be positive for RSV.  Apparently has had a slow decline over the last several weeks to months.  There was concern for volume overload as well.  Cardiology was consulted.  Patient was hospitalized for further management.    Consultants: Cardiology  Procedures: Echocardiogram    Subjective/Interval History: Overnight events noted.  Patient became quite hypoxic and hypotensive overnight.  She was stabilized.  She briefly required nonrebreather.  Noted to be on nasal cannula this morning.  However she is send not responsive at all.  Her baseline mentation is not clearly known though per cardiology notes apparently patient does have cognitive impairment.  Noted to be in mittens.  Not answering any questions.    Assessment/Plan:  Atrial fibrillation with RVR This appears to be new atrial fibrillation.  Seen by cardiology. Patient was on Eliquis but she was taking it for pulmonary embolism. TSH was noted to be normal. Echocardiogram shows LVEF of 35 to 40%.  Severe aortic valve stenosis was noted.  Other valvular abnormalities were also noted including mild mitral stenosis. Patient was placed on Cardizem infusion but she became hypotensive overnight and then subsequently converted to sinus rhythm.  Currently noted to be on metoprolol twice a day. Eliquis is being continued.  Hypotension Most likely multifactorial including medication effect as well as a degree of hypovolemia. Cardizem infusion was discontinued.  Blood pressures have improved.  Continue to monitor.  Acute  respiratory failure with hypoxia/RSV/history of COPD/bronchiectasis Unclear if she uses home O2 at baseline. Became quite hypoxic overnight.  Required nonrebreather briefly.  Currently on 4 to 5 L of oxygen by nasal cannula. Chest x-ray suggested multifocal pneumonia.  RSV is noted to be positive.  Procalcitonin 0.69. Patient noted to be on ceftriaxone and azithromycin. Strep pneumo urinary antigen is negative. Follow-up blood cultures.  Acute on chronic systolic CHF/multiple valvular abnormalities including severe aortic stenosis LVEF is 35 to 40%.  Back in 2023 EF is normal. Cardiology is following.  Low blood pressures make it challenging to initiate goal-directed medical treatment. However it also appears that patient has been declining for the past year or so according to cardiology notes.  She is likely not a candidate for aggressive testing or interventions.  Acute metabolic encephalopathy It appears that patient has cognitive impairment at baseline.  Came in with third delirium in the setting of acute illness.  Noted to be unresponsive this morning. Prognosis is guarded to poor.  History of pulmonary embolism Continue Eliquis.  Hypothyroidism Continue levothyroxine.  Normocytic anemia No evidence of overt bleeding.  Continue to monitor.  Goals of care According to cardiology notes and based on their discussions with patient's daughter patient has had a gradual decline over the past 1 year.  She had to be moved into an independent living facility few weeks ago.  She had continued to decline however.  Now she is poorly responsive.  Prognosis is guarded to poor.  Tried to call the daughter this morning and was not able to reach her but left a message.  Patient is already DNR/DNI.  If there is no significant improvement in the next 24 hours transition to comfort care will be  reasonable.  But definitely not recommend escalation of care. Was able to speak with daughter.  She confirmed  that patient has had a decline since Christmas.  She does have a history of dementia.  She was moved into assisted living facility few weeks ago because she was living at home prior to that and was not taking her medications and was not going for her doctors appointments.  Even at the assisted living facility she was experiencing sundowning and then she started having worsening cough about a week ago.  She does have chronic cough due to history of bronchiectasis and COPD.   Daughter understands that prognosis is guarded.  She is agreeable to transition to comfort care and hospice if the patient continues to decline.  We will continue current treatment for now.  We will not escalate care if she were to decline.   DVT Prophylaxis: On Eliquis Code Status: DNR Family Communication: Attempts to reach daughter not successful so far Disposition Plan: To be determined  Status is: Observation The patient will require care spanning > 2 midnights and should be moved to inpatient because: Acute respiratory failure with hypoxia      Medications: Scheduled:  acetylcysteine  3 mL Nebulization BID   apixaban  2.5 mg Oral BID   atorvastatin  20 mg Oral Daily   ipratropium-albuterol  3 mL Nebulization Q6H   levothyroxine  75 mcg Oral Q0600   [START ON 01/20/2024] methylPREDNISolone (SOLU-MEDROL) injection  40 mg Intravenous Daily   metoprolol tartrate  25 mg Oral BID   montelukast  10 mg Oral Daily   sodium chloride flush  3 mL Intravenous Q12H   Continuous:  azithromycin 500 mg (01/19/24 0238)   [START ON 01/20/2024] cefTRIAXone (ROCEPHIN)  IV     ZOX:[id number] **OR** acetaminophen, levalbuterol, polyethylene glycol  Antibiotics: Anti-infectives (From admission, onward)    Start     Dose/Rate Route Frequency Ordered Stop   01/20/24 0000  cefTRIAXone (ROCEPHIN) 2 g in sodium chloride 0.9 % 100 mL IVPB        2 g 200 mL/hr over 30 Minutes Intravenous Every 24 hours 01/19/24 0125 01/25/24 2359    01/19/24 0200  azithromycin (ZITHROMAX) 500 mg in sodium chloride 0.9 % 250 mL IVPB        500 mg 250 mL/hr over 60 Minutes Intravenous Every 24 hours 01/19/24 0104 01/24/24 0159   01/19/24 0145  cefTRIAXone (ROCEPHIN) 2 g in sodium chloride 0.9 % 100 mL IVPB        2 g 200 mL/hr over 30 Minutes Intravenous  Once 01/19/24 0132 01/19/24 0453       Objective:  Vital Signs  Vitals:   01/19/24 0416 01/19/24 0442 01/19/24 0513 01/19/24 0737  BP: 138/75 134/79 (!) 97/57 119/60  Pulse: 87 82 74 88  Resp: (!) 35 (!) 26 (!) 26 (!) 22  Temp:  97.8 F (36.6 C)  98.2 F (36.8 C)  TempSrc:    Axillary  SpO2: 100% 100% 100%   Weight:      Height:       No intake or output data in the 24 hours ending 01/19/24 0941 Filed Weights   01/18/24 0906 01/19/24 0206  Weight: 40 kg 40 kg    General appearance: Patient is unresponsive Resp: Tachypneic.  Coarse breath sounds with crackles bilateral bases.  No wheezing or rhonchi appreciated this morning Cardio: S1-S2 is normal regular.  No S3-S4.  No rubs murmurs or bruit GI: Abdomen is soft.  Nontender nondistended.  Bowel sounds are present normal.  No masses organomegaly Extremities: No edema.   Unresponsive.  Noted to be moving her extremities.   Lab Results:  Data Reviewed: I have personally reviewed following labs and reports of the imaging studies  CBC: Recent Labs  Lab 01/18/24 1029 01/19/24 0433  WBC 10.4 12.4*  NEUTROABS 8.6*  --   HGB 10.3* 11.4*  HCT 31.8* 35.0*  MCV 93.3 93.6  PLT 268 281    Basic Metabolic Panel: Recent Labs  Lab 01/18/24 1029 01/19/24 0433  NA 139 140  K 4.1 4.4  CL 101 99  CO2 26 25  GLUCOSE 101* 143*  BUN 12 22  CREATININE 0.59 0.80  CALCIUM 9.5 9.2  MG 2.1  --     GFR: Estimated Creatinine Clearance: 31.3 mL/min (by C-G formula based on SCr of 0.8 mg/dL).  Liver Function Tests: Recent Labs  Lab 01/18/24 1029 01/19/24 0433  AST 29 32  ALT 28 29  ALKPHOS 113 109  BILITOT 0.6  1.0  PROT 7.8 7.1  ALBUMIN 3.7 3.5    Recent Labs  Lab 01/18/24 1029  LIPASE 22    Thyroid Function Tests: Recent Labs    01/18/24 1029  TSH 4.127     Recent Results (from the past 240 hours)  Resp panel by RT-PCR (RSV, Flu A&B, Covid) Nasal Mucosa     Status: Abnormal   Collection Time: 01/18/24  9:13 AM   Specimen: Nasal Mucosa; Nasal Swab  Result Value Ref Range Status   SARS Coronavirus 2 by RT PCR NEGATIVE NEGATIVE Final   Influenza A by PCR NEGATIVE NEGATIVE Final   Influenza B by PCR NEGATIVE NEGATIVE Final    Comment: (NOTE) The Xpert Xpress SARS-CoV-2/FLU/RSV plus assay is intended as an aid in the diagnosis of influenza from Nasopharyngeal swab specimens and should not be used as a sole basis for treatment. Nasal washings and aspirates are unacceptable for Xpert Xpress SARS-CoV-2/FLU/RSV testing.  Fact Sheet for Patients: BloggerCourse.com  Fact Sheet for Healthcare Providers: SeriousBroker.it  This test is not yet approved or cleared by the [place] FDA and has been authorized for detection and/or diagnosis of SARS-CoV-2 by FDA under an Emergency Use Authorization (EUA). This EUA will remain in effect (meaning this test can be used) for the duration of the COVID-19 declaration under Section 564(b)(1) of the Act, 21 U.S.C. section 360bbb-3(b)(1), unless the authorization is terminated or revoked.     Resp Syncytial Virus by PCR POSITIVE (A) NEGATIVE Final    Comment: (NOTE) Fact Sheet for Patients: BloggerCourse.com  Fact Sheet for Healthcare Providers: SeriousBroker.it  This test is not yet approved or cleared by the [place] FDA and has been authorized for detection and/or diagnosis of SARS-CoV-2 by FDA under an Emergency Use Authorization (EUA). This EUA will remain in effect (meaning this test can be used) for the duration of  the COVID-19 declaration under Section 564(b)(1) of the Act, 21 U.S.C. section 360bbb-3(b)(1), unless the authorization is terminated or revoked.  Performed at [facility], [address] [place], [place] [address]   MRSA Next Gen by PCR, Nasal     Status: None   Collection Time: 01/19/24  2:52 AM  Result Value Ref Range Status   MRSA by PCR Next Gen NOT DETECTED NOT DETECTED Final    Comment: (NOTE) The GeneXpert MRSA Assay (FDA approved for NASAL specimens only), is one component of a comprehensive MRSA colonization surveillance program. It  is not intended to diagnose MRSA infection nor to guide or monitor treatment for MRSA infections. Test performance is not FDA approved in patients less than 35 years old. Performed at [facility], [address] [place], [place] [address]       Radiology Studies: DG CHEST PORT 1 VIEW Result Date: 01/19/2024 CLINICAL DATA:  Shortness of breath EXAM: PORTABLE CHEST 1 VIEW COMPARISON:  01/18/2024 FINDINGS: Cardiomegaly. Worsening patchy bilateral airspace disease, most progressed in the upper lobes. Small bilateral pleural effusions. No acute bony abnormality. IMPRESSION: Patchy bilateral airspace disease, worsening in the upper lobes concerning for multifocal pneumonia. Electronically Signed   By: Charlett Nose M.D.   On: 01/19/2024 01:03   ECHOCARDIOGRAM COMPLETE Result Date: 01/18/2024    ECHOCARDIOGRAM REPORT   Patient Name:   Nicole [name] Date of Exam: 01/18/2024 Medical Rec #:  [id number]     Height:       60.0 in Accession #:    [id number]    Weight:       88.2 lb Date of Birth:  1935-05-14     BSA:          1.318 m Patient Age:    89 years      BP:           140/94 mmHg Patient Gender: F             HR:           149 bpm. Exam Location:  Inpatient Procedure: 2D Echo, Cardiac Doppler and Color Doppler (Both Spectral and Color            Flow Doppler were utilized during procedure). Indications:    Atrial fibrillation, Congestive  heart failure  History:        Patient has prior history of Echocardiogram examinations. CHF,                 Arrythmias:Atrial Fibrillation and Bradycardia; Risk                 Factors:Hypertension.  Sonographer:    Lamont Snowball Referring Phys: Cecille Po MELVIN IMPRESSIONS  1. Left ventricular ejection fraction, by estimation, is 35 to 40%. The left ventricle has moderately decreased function. The left ventricle demonstrates global hypokinesis. There is mild concentric left ventricular hypertrophy. Left ventricular diastolic function could not be evaluated.  2. Right ventricular systolic function is moderately reduced. The right ventricular size is normal. There is severely elevated pulmonary artery systolic pressure.  3. Left atrial size was severely dilated.  4. The mitral valve is degenerative. Moderate mitral valve regurgitation. Mild mitral stenosis. The mean mitral valve gradient is 9.0 mmHg with average heart rate of 150 bpm. Moderate to severe mitral annular calcification.  5. Tricuspid valve regurgitation is moderate.  6. The aortic valve is tricuspid. There is severe calcifcation of the aortic valve. There is severe thickening of the aortic valve. Aortic valve regurgitation is mild. Severe aortic valve stenosis.  7. There is Moderate (Grade III) protruding plaque involving the descending aorta.  8. The inferior vena cava is dilated in size with <50% respiratory variability, suggesting right atrial pressure of 15 mmHg. Comparison(s): The left ventricular function is significantly worse. The right ventricular systolic function is significantly worse. There is probably severe low flow low gradient aortic stenosis. The increased mitral valve gradients are primarily due to  tachycardia and not due to valve disease progression. Conclusion(s)/Recommendation(s): There is severe tachycardia (150 bpm) during the study,  the rhythm may be atrial flutter. Recommend repeating the study at physiological heart rates.  FINDINGS  Left Ventricle: Left ventricular ejection fraction, by estimation, is 35 to 40%. The left ventricle has moderately decreased function. The left ventricle demonstrates global hypokinesis. The left ventricular internal cavity size was normal in size. There is mild concentric left ventricular hypertrophy. Left ventricular diastolic function could not be evaluated due to atrial fibrillation. Left ventricular diastolic function could not be evaluated. Right Ventricle: The right ventricular size is normal. No increase in right ventricular wall thickness. Right ventricular systolic function is moderately reduced. There is severely elevated pulmonary artery systolic pressure. The tricuspid regurgitant velocity is 3.40 m/s, and with an assumed right atrial pressure of 15 mmHg, the estimated right ventricular systolic pressure is 61.2 mmHg. Left Atrium: Left atrial size was severely dilated. Right Atrium: Right atrial size was normal in size. Prominent Chiari network and Prominent Eustachian valve. Pericardium: There is no evidence of pericardial effusion. Mitral Valve: The mitral valve is degenerative in appearance. Moderate to severe mitral annular calcification. Moderate mitral valve regurgitation, with centrally-directed jet. Mild mitral valve stenosis. MV peak gradient, 11.0 mmHg. The mean mitral valve gradient is 9.0 mmHg with average heart rate of 150 bpm. Tricuspid Valve: The tricuspid valve is normal in structure. Tricuspid valve regurgitation is moderate. Aortic Valve: The aortic valve is tricuspid. There is severe calcifcation of the aortic valve. There is severe thickening of the aortic valve. Aortic valve regurgitation is mild. Severe aortic stenosis is present. Aortic valve mean gradient measures 33.0  mmHg. Aortic valve peak gradient measures 51.1 mmHg. Aortic valve area, by VTI measures 0.64 cm. Pulmonic Valve: The pulmonic valve was grossly normal. Pulmonic valve regurgitation is not visualized. No  evidence of pulmonic stenosis. Aorta: The aortic root and ascending aorta are structurally normal, with no evidence of dilitation. There is moderate (Grade III) protruding plaque involving the descending aorta. Venous: The inferior vena cava is dilated in size with less than 50% respiratory variability, suggesting right atrial pressure of 15 mmHg. IAS/Shunts: No atrial level shunt detected by color flow Doppler.  LEFT VENTRICLE PLAX 2D LVIDd:         3.60 cm LVIDs:         2.70 cm LV PW:         1.20 cm LV IVS:        1.20 cm LVOT diam:     1.90 cm LV SV:         34 LV SV Index:   26 LVOT Area:     2.84 cm  RIGHT VENTRICLE          IVC RV Basal diam:  3.70 cm  IVC diam: 2.30 cm TAPSE (M-mode): 1.5 cm LEFT ATRIUM             Index        RIGHT ATRIUM           Index LA diam:        3.40 cm 2.58 cm/m   RA Area:     10.70 cm LA Vol (A2C):   74.8 ml 56.75 ml/m  RA Volume:   22.20 ml  16.84 ml/m LA Vol (A4C):   89.2 ml 67.68 ml/m LA Biplane Vol: 82.4 ml 62.52 ml/m  AORTIC VALVE AV Area (Vmax):    0.70 cm AV Area (Vmean):   0.66 cm AV Area (VTI):     0.64 cm AV Vmax:  357.33 cm/s AV Vmean:          267.000 cm/s AV VTI:            0.532 m AV Peak Grad:      51.1 mmHg AV Mean Grad:      33.0 mmHg LVOT Vmax:         88.50 cm/s LVOT Vmean:        61.700 cm/s LVOT VTI:          0.121 m LVOT/AV VTI ratio: 0.23  AORTA Ao Root diam: 2.90 cm Ao Asc diam:  3.00 cm MITRAL VALVE                TRICUSPID VALVE MV Area (PHT): 5.38 cm     TR Peak grad:   46.2 mmHg MV Area VTI:   2.35 cm     TR Vmax:        340.00 cm/s MV Peak grad:  11.0 mmHg MV Mean grad:  9.0 mmHg     SHUNTS MV Vmax:       1.66 m/s     Systemic VTI:  0.12 m MV Vmean:      125.5 cm/s   Systemic Diam: 1.90 cm MV Decel Time: 141 msec MV E velocity: 153.00 cm/s Rachelle Hora Croitoru MD Electronically signed by Thurmon Fair MD Signature Date/Time: 01/18/2024/5:56:58 PM    Final    DG Chest Portable 1 View Result Date: 01/18/2024 CLINICAL DATA:  Cough,  shortness of breath, fatigue, atrial fibrillation and right arm pain. EXAM: PORTABLE CHEST 1 VIEW COMPARISON:  10/17/2023 FINDINGS: Stable mildly enlarged cardiac silhouette. Interval patchy density at the right lung base with less prominent patchy density at the left lung base. Small bilateral pleural effusions. Mild increase in prominence of the interstitial markings with some Kerley lines. Mild increase in prominence of the pulmonary vasculature. Diffuse osteopenia. IMPRESSION: 1. Mild congestive heart failure. 2. Bibasilar atelectasis, pneumonia or alveolar edema, new on the right and less prominent on the left. 3. Small bilateral pleural effusions. Electronically Signed   By: Beckie Salts M.D.   On: 01/18/2024 15:45       LOS: 0 days   Mattew Chriswell Rito Ehrlich  [facility] Pager on [email or web address]  01/19/2024, 9:41 AM

## 2024-01-19 NOTE — Plan of Care (Signed)

## 2024-01-19 NOTE — Progress Notes (Signed)
 Rounding Note    Patient Name: Nicole [name] Date of Encounter: 01/19/2024  [facility] Cardiologist: S[facility]: Penni Bombard MD   Subjective   Pt found resting  Inpatient Medications    Scheduled Meds:  acetylcysteine  3 mL Nebulization BID   apixaban  2.5 mg Oral BID   atorvastatin  20 mg Oral Daily   ipratropium-albuterol  3 mL Nebulization Q6H   levothyroxine  75 mcg Oral Q0600   [START ON 01/20/2024] methylPREDNISolone (SOLU-MEDROL) injection  40 mg Intravenous Daily   metoprolol tartrate  5 mg Intravenous Q6H   montelukast  10 mg Oral Daily   sodium chloride flush  3 mL Intravenous Q12H   Continuous Infusions:  azithromycin 500 mg (01/19/24 0238)   [START ON 01/20/2024] cefTRIAXone (ROCEPHIN)  IV     PRN Meds: acetaminophen **OR** acetaminophen, levalbuterol, polyethylene glycol   Vital Signs    Vitals:   01/19/24 0416 01/19/24 0442 01/19/24 0513 01/19/24 0737  BP: 138/75 134/79 (!) 97/57 119/60  Pulse: 87 82 74 88  Resp: (!) 35 (!) 26 (!) 26 (!) 22  Temp:  97.8 F (36.6 C)  98.2 F (36.8 C)  TempSrc:    Axillary  SpO2: 100% 100% 100%   Weight:      Height:       No intake or output data in the 24 hours ending 01/19/24 0842    01/19/2024    2:06 AM 01/18/2024    9:06 AM 10/17/2023    8:10 PM  Last 3 Weights  Weight (lbs) 88 lb 2.9 oz 88 lb 2.9 oz 86 lb 6.7 oz  Weight (kg) 40 kg 40 kg 39.2 kg      Telemetry    Sinus rhythm with PACs HR in the 90s - Personally Reviewed  ECG    Sinus rhythm with HR 83 with RBBB - Personally Reviewed  Physical Exam   GEN: No acute distress.   Neck: No JVD Cardiac: RRR, 4-6 systolic murmur Respiratory: rhonchus throughout GI: Soft, nontender, non-distended  MS: No edema; No deformity. Neuro:  Nonfocal  Psych: sleeping  Labs    High Sensitivity Troponin:   Recent Labs  Lab 01/18/24 1029 01/18/24 1700  TROPONINIHS 25* 30*     Chemistry Recent Labs  Lab 01/18/24 1029  01/19/24 0433  NA 139 140  K 4.1 4.4  CL 101 99  CO2 26 25  GLUCOSE 101* 143*  BUN 12 22  CREATININE 0.59 0.80  CALCIUM 9.5 9.2  MG 2.1  --   PROT 7.8 7.1  ALBUMIN 3.7 3.5  AST 29 32  ALT 28 29  ALKPHOS 113 109  BILITOT 0.6 1.0  GFRNONAA >60 >60  ANIONGAP 12 16*    Lipids No results for input(s): "CHOL", "TRIG", "HDL", "LABVLDL", "LDLCALC", "CHOLHDL" in the last 168 hours.  Hematology Recent Labs  Lab 01/18/24 1029 01/19/24 0433  WBC 10.4 12.4*  RBC 3.41* 3.74*  HGB 10.3* 11.4*  HCT 31.8* 35.0*  MCV 93.3 93.6  MCH 30.2 30.5  MCHC 32.4 32.6  RDW 17.5* 17.0*  PLT 268 281   Thyroid  Recent Labs  Lab 01/18/24 1029  TSH 4.127    BNP Recent Labs  Lab 01/18/24 1029  BNP 1,319.6*    DDimer No results for input(s): "DDIMER" in the last 168 hours.   Radiology    DG CHEST PORT 1 VIEW Result Date: 01/19/2024 CLINICAL DATA:  Shortness of breath EXAM: PORTABLE CHEST 1  VIEW COMPARISON:  01/18/2024 FINDINGS: Cardiomegaly. Worsening patchy bilateral airspace disease, most progressed in the upper lobes. Small bilateral pleural effusions. No acute bony abnormality. IMPRESSION: Patchy bilateral airspace disease, worsening in the upper lobes concerning for multifocal pneumonia. Electronically Signed   By: Charlett Nose M.D.   On: 01/19/2024 01:03   ECHOCARDIOGRAM COMPLETE Result Date: 01/18/2024    ECHOCARDIOGRAM REPORT   Patient Name:   Nicole [name] Date of Exam: 01/18/2024 Medical Rec #:  [id number]     Height:       60.0 in Accession #:    [id number]    Weight:       88.2 lb Date of Birth:  [birth year]     BSA:          1.318 m Patient Age:    89 years      BP:           140/94 mmHg Patient Gender: F             HR:           149 bpm. Exam Location:  Inpatient Procedure: 2D Echo, Cardiac Doppler and Color Doppler (Both Spectral and Color            Flow Doppler were utilized during procedure). Indications:    Atrial fibrillation, Congestive heart failure  History:        Patient has  prior history of Echocardiogram examinations. CHF,                 Arrythmias:Atrial Fibrillation and Bradycardia; Risk                 Factors:Hypertension.  Sonographer:    Lamont Snowball Referring Phys: Cecille Po MELVIN IMPRESSIONS  1. Left ventricular ejection fraction, by estimation, is 35 to 40%. The left ventricle has moderately decreased function. The left ventricle demonstrates global hypokinesis. There is mild concentric left ventricular hypertrophy. Left ventricular diastolic function could not be evaluated.  2. Right ventricular systolic function is moderately reduced. The right ventricular size is normal. There is severely elevated pulmonary artery systolic pressure.  3. Left atrial size was severely dilated.  4. The mitral valve is degenerative. Moderate mitral valve regurgitation. Mild mitral stenosis. The mean mitral valve gradient is 9.0 mmHg with average heart rate of 150 bpm. Moderate to severe mitral annular calcification.  5. Tricuspid valve regurgitation is moderate.  6. The aortic valve is tricuspid. There is severe calcifcation of the aortic valve. There is severe thickening of the aortic valve. Aortic valve regurgitation is mild. Severe aortic valve stenosis.  7. There is Moderate (Grade III) protruding plaque involving the descending aorta.  8. The inferior vena cava is dilated in size with <50% respiratory variability, suggesting right atrial pressure of 15 mmHg. Comparison(s): The left ventricular function is significantly worse. The right ventricular systolic function is significantly worse. There is probably severe low flow low gradient aortic stenosis. The increased mitral valve gradients are primarily due to  tachycardia and not due to valve disease progression. Conclusion(s)/Recommendation(s): There is severe tachycardia (150 bpm) during the study, the rhythm may be atrial flutter. Recommend repeating the study at physiological heart rates. FINDINGS  Left Ventricle: Left ventricular  ejection fraction, by estimation, is 35 to 40%. The left ventricle has moderately decreased function. The left ventricle demonstrates global hypokinesis. The left ventricular internal cavity size was normal in size. There is mild concentric left ventricular hypertrophy. Left ventricular diastolic function could not be evaluated due  to atrial fibrillation. Left ventricular diastolic function could not be evaluated. Right Ventricle: The right ventricular size is normal. No increase in right ventricular wall thickness. Right ventricular systolic function is moderately reduced. There is severely elevated pulmonary artery systolic pressure. The tricuspid regurgitant velocity is 3.40 m/s, and with an assumed right atrial pressure of 15 mmHg, the estimated right ventricular systolic pressure is 61.2 mmHg. Left Atrium: Left atrial size was severely dilated. Right Atrium: Right atrial size was normal in size. Prominent Chiari network and Prominent Eustachian valve. Pericardium: There is no evidence of pericardial effusion. Mitral Valve: The mitral valve is degenerative in appearance. Moderate to severe mitral annular calcification. Moderate mitral valve regurgitation, with centrally-directed jet. Mild mitral valve stenosis. MV peak gradient, 11.0 mmHg. The mean mitral valve gradient is 9.0 mmHg with average heart rate of 150 bpm. Tricuspid Valve: The tricuspid valve is normal in structure. Tricuspid valve regurgitation is moderate. Aortic Valve: The aortic valve is tricuspid. There is severe calcifcation of the aortic valve. There is severe thickening of the aortic valve. Aortic valve regurgitation is mild. Severe aortic stenosis is present. Aortic valve mean gradient measures 33.0  mmHg. Aortic valve peak gradient measures 51.1 mmHg. Aortic valve area, by VTI measures 0.64 cm. Pulmonic Valve: The pulmonic valve was grossly normal. Pulmonic valve regurgitation is not visualized. No evidence of pulmonic stenosis. Aorta: The  aortic root and ascending aorta are structurally normal, with no evidence of dilitation. There is moderate (Grade III) protruding plaque involving the descending aorta. Venous: The inferior vena cava is dilated in size with less than 50% respiratory variability, suggesting right atrial pressure of 15 mmHg. IAS/Shunts: No atrial level shunt detected by color flow Doppler.  LEFT VENTRICLE PLAX 2D LVIDd:         3.60 cm LVIDs:         2.70 cm LV PW:         1.20 cm LV IVS:        1.20 cm LVOT diam:     1.90 cm LV SV:         34 LV SV Index:   26 LVOT Area:     2.84 cm  RIGHT VENTRICLE          IVC RV Basal diam:  3.70 cm  IVC diam: 2.30 cm TAPSE (M-mode): 1.5 cm LEFT ATRIUM             Index        RIGHT ATRIUM           Index LA diam:        3.40 cm 2.58 cm/m   RA Area:     10.70 cm LA Vol (A2C):   74.8 ml 56.75 ml/m  RA Volume:   22.20 ml  16.84 ml/m LA Vol (A4C):   89.2 ml 67.68 ml/m LA Biplane Vol: 82.4 ml 62.52 ml/m  AORTIC VALVE AV Area (Vmax):    0.70 cm AV Area (Vmean):   0.66 cm AV Area (VTI):     0.64 cm AV Vmax:           357.33 cm/s AV Vmean:          267.000 cm/s AV VTI:            0.532 m AV Peak Grad:      51.1 mmHg AV Mean Grad:      33.0 mmHg LVOT Vmax:         88.50 cm/s LVOT Vmean:  61.700 cm/s LVOT VTI:          0.121 m LVOT/AV VTI ratio: 0.23  AORTA Ao Root diam: 2.90 cm Ao Asc diam:  3.00 cm MITRAL VALVE                TRICUSPID VALVE MV Area (PHT): 5.38 cm     TR Peak grad:   46.2 mmHg MV Area VTI:   2.35 cm     TR Vmax:        340.00 cm/s MV Peak grad:  11.0 mmHg MV Mean grad:  9.0 mmHg     SHUNTS MV Vmax:       1.66 m/s     Systemic VTI:  0.12 m MV Vmean:      125.5 cm/s   Systemic Diam: 1.90 cm MV Decel Time: 141 msec MV E velocity: 153.00 cm/s Rachelle Hora Croitoru MD Electronically signed by Thurmon Fair MD Signature Date/Time: 01/18/2024/5:56:58 PM    Final    DG Chest Portable 1 View Result Date: 01/18/2024 CLINICAL DATA:  Cough, shortness of breath, fatigue, atrial  fibrillation and right arm pain. EXAM: PORTABLE CHEST 1 VIEW COMPARISON:  10/17/2023 FINDINGS: Stable mildly enlarged cardiac silhouette. Interval patchy density at the right lung base with less prominent patchy density at the left lung base. Small bilateral pleural effusions. Mild increase in prominence of the interstitial markings with some Kerley lines. Mild increase in prominence of the pulmonary vasculature. Diffuse osteopenia. IMPRESSION: 1. Mild congestive heart failure. 2. Bibasilar atelectasis, pneumonia or alveolar edema, new on the right and less prominent on the left. 3. Small bilateral pleural effusions. Electronically Signed   By: Beckie Salts M.D.   On: 01/18/2024 15:45    Cardiac Studies   Echo 01/18/24:  1. Left ventricular ejection fraction, by estimation, is 35 to 40%. The  left ventricle has moderately decreased function. The left ventricle  demonstrates global hypokinesis. There is mild concentric left ventricular  hypertrophy. Left ventricular  diastolic function could not be evaluated.   2. Right ventricular systolic function is moderately reduced. The right  ventricular size is normal. There is severely elevated pulmonary artery  systolic pressure.   3. Left atrial size was severely dilated.   4. The mitral valve is degenerative. Moderate mitral valve regurgitation.  Mild mitral stenosis. The mean mitral valve gradient is 9.0 mmHg with  average heart rate of 150 bpm. Moderate to severe mitral annular  calcification.   5. Tricuspid valve regurgitation is moderate.   6. The aortic valve is tricuspid. There is severe calcifcation of the  aortic valve. There is severe thickening of the aortic valve. Aortic valve  regurgitation is mild. Severe aortic valve stenosis.   7. There is Moderate (Grade III) protruding plaque involving the  descending aorta.   8. The inferior vena cava is dilated in size with <50% respiratory  variability, suggesting right atrial pressure of 15  mmHg.   Patient Profile     89 y.o. female with PMH of severe aortic stenosis, HFpEF, COPD, prior PE on eliquis, HTN, HLD, hypothyroidism, and prior lobectomy presented with SOB and cough found to be in Afib RVR.   Assessment & Plan    Afib with RVR - converted to SR overnight with HR now in the 80s-90s - cardizem gtt now off - was held prior to conversion due to hypotension - she remains on IV lopressor 5 mg, but now taking PO medications - will transition to 25 mg lopressor BID  Severe aortic stenosis - code status now DNR-limited - no plans for intervention   Acute systolic heart failure - echo this admission with LVEF 35-40% - question if related to Afib RVR - has received 60 mg IV lasix x 1 dose with hypotension - would hold further diuresis - no I&Os charted   Chronic anticoagulation Hx of PE - continue eliquis 2.5 mg BID   Acute hypoxic respiratory failure In the setting of COPD, multifocal PNA, CHF exacerbation, and aortic stenosis - remains on [place] - has received 60 mg IV lasix - CXR this morning more concerning for PNA      For questions or updates, please contact [facility] Please consult [email or web address] for contact info under        Signed, Marcelino Duster, PA  01/19/2024, 8:42 AM

## 2024-01-19 NOTE — Progress Notes (Addendum)
 Acute hypoxic respiratory failure-secondary to COPD, multifocal pneumonia and CHF exacerbation RN reported that patient O2 sat dropped to 72% as patient was keep taking of the nasal cannula oxygen.  Currently has been placed on nonrebreather and O2 sat has been improved to 92 to 93%.  Physical exam showing diffuse rhonchi and rales bilateral upper and lower lung field - Per chart review chest x-ray showing mild congestive heart failure, bibasal atelectasis/pneumonia/pulmonary edema new on the right and less prominent left.  Small bilateral pleural effusion - Patient being admitted for A-fib RVR currently on Cardizem drip. -Concern for acute hypoxic respiratory failure both component of COPD and CHF exacerbation. Plan COPD exacerbation - Obtaining repeat chest x-ray, and checking respiratory panel.  Checking VBG. - Will start IV Solu-Medrol 125 mg followed by 40 mg daily, DuoNeb every 6 hours scheduled, Xopenex as needed.  Nonrebreather.  Aspiration precaution.  Azithromycin 500 mg for 5 days course.  -Given patient is afebrile and no evidence of leukocytosis low susceptibility of any pneumonia rather than patient having CHF and COPD exacerbation.   CHF exacerbation -Echo obtained today which showed EF 35 to 40% global hypokinesis and unable to obtain diastolic parameter. -Elevated BNP around 1400.  Initial chest x-ray showing pulmonary vascular congestion. - Pending repeat chest x-ray. -Concern for CHF exacerbation as well.  Blood pressure is elevated even patient is on Cardizem drip.  There is room to give Lasix 60 mg IV now.  Followed by will continue 40 mg twice daily. -Continue nonrebreather 8 to 10 L goal to keep O2 sat above 92%.   Multifocal pneumonia - Repeat chest x-ray showing multifocal pneumonia. -Starting broad-spectrum IV coverage with ceftriaxone and continue azithromycin. - Checking sputum culture, blood culture, respiratory panel, Legionella and strep antigen  test.Procalcitonin level.   Addendum -RN reported patient blood pressure dropped to 70/45 MAP 53.  Wake patient up and recheck the blood pressure again which is 105/68 and MAP is 80.    -Patient is on Cardizem drip for A-fib RVR.  Her blood pressure was 202/176. Earlier tonight patient found to have CHF exacerbation, COPD exacerbation and pneumonia as well.  Blood pressure has been dropped after giving Lasix 60 mg. -Holding further diuretics and holding the Cardizem drip at 4 AM as heart rate has been improved to 80 to 90s range. -Patient being started on Lopressor 5 mg every 6 hours scheduled by cardiology.  Will continue that however holding parameter is if systolic blood pressure drop below 90 hold it and notify MD. -Due to hypotension giving albumin 50 g.   CRITICAL CARE Performed by: Tereasa Coop, MD     Total critical care time: 35 minutes   Critical care time was exclusive of separately billable procedures and treating other patients.   Critical care was necessary to treat or prevent imminent or life-threatening deterioration.   Critical care was time spent personally by me on the following activities: development of treatment plan with patient and/or surrogate as well as nursing, discussions with consultants, evaluation of patient's response to treatment, examination of patient, obtaining history from patient or surrogate, ordering and performing treatments and interventions, ordering and review of laboratory studies, ordering and review of radiographic studies, pulse oximetry and re-evaluation of patient's condition.

## 2024-01-19 NOTE — Progress Notes (Signed)
 A-fib RVR-resolved Patient's RN reporting that patient has converted to sinus rhythm.  Obtained EKG which showing normal sinus rhythm heart rate 83.  Discontinuing Cardizem drip.  Of note, Cardizem drip on hold since 4 AM in the setting of persistent hypotension. -Continue IV Lopressor 5 mg every 6 hours which has been ordered by cardiology team.  However will hold IV Lopressor if blood pressure below 90.  Tereasa Coop, MD [facility] 01/19/2024, 4:59 AM

## 2024-01-19 NOTE — Progress Notes (Signed)
   01/19/24 0305  Vitals  Temp 97.7 F (36.5 C)  Temp Source Axillary  BP 98/63  MAP (mmHg) 74  BP Location Right Arm  BP Method Automatic  Patient Position (if appropriate) Lying  Pulse Rate (!) 54  Pulse Rate Source Monitor  ECG Heart Rate 83  Resp (!) 29  Level of Consciousness  Level of Consciousness Alert  MEWS COLOR  MEWS Score Color Yellow  Oxygen Therapy  SpO2 (!) 89 %  O2 Device Non-rebreather Mask  O2 Flow Rate (L/min) 8 L/min  Pain Assessment  Pain Scale 0-10  Pain Score 0  MEWS Score  MEWS Temp 0  MEWS Systolic 1  MEWS Pulse 0  MEWS RR 2  MEWS LOC 0  MEWS Score 3  Provider Notification  Provider Name/Title Dr Janalyn Shy  Date Provider Notified 01/19/24  Time Provider Notified 0330  Method of Notification Page  Notification Reason Change in status  Provider response See new orders  Date of Provider Response 01/19/24  Time of Provider Response 0330

## 2024-01-19 NOTE — Progress Notes (Signed)
 Heart Failure Navigator Progress Note  Assessed for Heart & Vascular TOC clinic readiness.  Patient does not meet criteria due to patient with [facility]. .   Navigator will sign off at this time.   Rhae Hammock, BSN, Scientist, clinical (histocompatibility and immunogenetics) Only

## 2024-01-19 NOTE — Progress Notes (Addendum)
 RN reported patient blood pressure dropped to 70/45 MAP 53.  Wake patient up and recheck the blood pressure again which is 105/68 and MAP is 80.     -Patient is on Cardizem drip for A-fib RVR.  Her blood pressure was 202/176. Earlier tonight patient found to have CHF exacerbation, COPD exacerbation and pneumonia as well.  Blood pressure has been dropped after giving Lasix 60 mg. -Holding further diuretics and holding the Cardizem drip at 4 AM as heart rate has been improved to 80 to 90s range. -Due to hypotension giving albumin 50 g.  Tereasa Coop, MD [facility] 01/19/2024, 3:56 AM

## 2024-01-19 DEATH — deceased

## 2024-01-20 DIAGNOSIS — I5043 Acute on chronic combined systolic (congestive) and diastolic (congestive) heart failure: Secondary | ICD-10-CM | POA: Diagnosis not present

## 2024-01-20 DIAGNOSIS — Z7189 Other specified counseling: Secondary | ICD-10-CM

## 2024-01-20 DIAGNOSIS — J449 Chronic obstructive pulmonary disease, unspecified: Secondary | ICD-10-CM

## 2024-01-20 DIAGNOSIS — J210 Acute bronchiolitis due to respiratory syncytial virus: Secondary | ICD-10-CM

## 2024-01-20 DIAGNOSIS — J479 Bronchiectasis, uncomplicated: Secondary | ICD-10-CM | POA: Diagnosis not present

## 2024-01-20 DIAGNOSIS — J9601 Acute respiratory failure with hypoxia: Secondary | ICD-10-CM | POA: Diagnosis not present

## 2024-01-20 DIAGNOSIS — I4891 Unspecified atrial fibrillation: Secondary | ICD-10-CM | POA: Diagnosis not present

## 2024-01-20 DIAGNOSIS — J189 Pneumonia, unspecified organism: Secondary | ICD-10-CM

## 2024-01-20 DIAGNOSIS — I5032 Chronic diastolic (congestive) heart failure: Secondary | ICD-10-CM | POA: Diagnosis not present

## 2024-01-20 LAB — CBC
HCT: 31.2 % — ABNORMAL LOW (ref 36.0–46.0)
Hemoglobin: 9.9 g/dL — ABNORMAL LOW (ref 12.0–15.0)
MCH: 30 pg (ref 26.0–34.0)
MCHC: 31.7 g/dL (ref 30.0–36.0)
MCV: 94.5 fL (ref 80.0–100.0)
Platelets: 264 10*3/uL (ref 150–400)
RBC: 3.3 MIL/uL — ABNORMAL LOW (ref 3.87–5.11)
RDW: 17 % — ABNORMAL HIGH (ref 11.5–15.5)
WBC: 14.4 10*3/uL — ABNORMAL HIGH (ref 4.0–10.5)
nRBC: 0 % (ref 0.0–0.2)

## 2024-01-20 LAB — COMPREHENSIVE METABOLIC PANEL WITH GFR
ALT: 28 U/L (ref 0–44)
AST: 36 U/L (ref 15–41)
Albumin: 3.3 g/dL — ABNORMAL LOW (ref 3.5–5.0)
Alkaline Phosphatase: 80 U/L (ref 38–126)
Anion gap: 12 (ref 5–15)
BUN: 40 mg/dL — ABNORMAL HIGH (ref 8–23)
CO2: 28 mmol/L (ref 22–32)
Calcium: 8.7 mg/dL — ABNORMAL LOW (ref 8.9–10.3)
Chloride: 102 mmol/L (ref 98–111)
Creatinine, Ser: 0.82 mg/dL (ref 0.44–1.00)
GFR, Estimated: >60 mL/min (ref >60)
Glucose, Bld: 115 mg/dL — ABNORMAL HIGH (ref 70–99)
Potassium: 3.7 mmol/L (ref 3.5–5.1)
Sodium: 142 mmol/L (ref 135–145)
Total Bilirubin: 0.5 mg/dL (ref 0.0–1.2)
Total Protein: 5.9 g/dL — ABNORMAL LOW (ref 6.5–8.1)

## 2024-01-20 LAB — LEGIONELLA PNEUMOPHILA SEROGP 1 UR AG: L. pneumophila Serogp 1 Ur Ag: NEGATIVE

## 2024-01-20 LAB — MAGNESIUM: Magnesium: 2.2 mg/dL (ref 1.7–2.4)

## 2024-01-20 MED ORDER — POLYVINYL ALCOHOL 1.4 % OP SOLN: 1.0000 [drp] | Freq: Four times a day (QID) | OPHTHALMIC | Status: DC | PRN

## 2024-01-20 MED ORDER — GLYCOPYRROLATE 0.2 MG/ML IJ SOLN
0.2000 mg | INTRAMUSCULAR | Status: DC | PRN
  Administered 2024-01-20: 0.2 mg via INTRAVENOUS
  Filled 2024-01-20: qty 1

## 2024-01-20 MED ORDER — ONDANSETRON 4 MG PO TBDP: 4.0000 mg | ORAL_TABLET | Freq: Four times a day (QID) | ORAL | Status: DC | PRN

## 2024-01-20 MED ORDER — LORAZEPAM 1 MG PO TABS: 1.0000 mg | ORAL_TABLET | ORAL | Status: DC | PRN

## 2024-01-20 MED ORDER — HALOPERIDOL 0.5 MG PO TABS: 0.5000 mg | ORAL_TABLET | ORAL | Status: DC | PRN

## 2024-01-20 MED ORDER — HALOPERIDOL LACTATE 5 MG/ML IJ SOLN
0.5000 mg | INTRAMUSCULAR | Status: DC | PRN
  Administered 2024-01-20: 0.5 mg via INTRAVENOUS
  Filled 2024-01-20: qty 1

## 2024-01-20 MED ORDER — GLYCOPYRROLATE 0.2 MG/ML IJ SOLN: 0.2000 mg | INTRAMUSCULAR | Status: DC | PRN

## 2024-01-20 MED ORDER — GLYCOPYRROLATE 1 MG PO TABS: 1.0000 mg | ORAL_TABLET | ORAL | Status: DC | PRN

## 2024-01-20 MED ORDER — MORPHINE SULFATE (PF) 2 MG/ML IV SOLN
1.0000 mg | INTRAVENOUS | Status: DC | PRN
  Administered 2024-01-20 – 2024-01-21 (×5): 2 mg via INTRAVENOUS
  Filled 2024-01-20 (×6): qty 1

## 2024-01-20 MED ORDER — IPRATROPIUM-ALBUTEROL 0.5-2.5 (3) MG/3ML IN SOLN: 3.0000 mL | Freq: Two times a day (BID) | RESPIRATORY_TRACT | Status: DC

## 2024-01-20 MED ORDER — MORPHINE SULFATE (CONCENTRATE) 10 MG /0.5 ML PO SOLN: 5.0000 mg | ORAL | Status: DC | PRN

## 2024-01-20 MED ORDER — HALOPERIDOL LACTATE 2 MG/ML PO CONC: 0.5000 mg | ORAL | Status: DC | PRN

## 2024-01-20 MED ORDER — LORAZEPAM 2 MG/ML PO CONC: 1.0000 mg | ORAL | Status: DC | PRN

## 2024-01-20 MED ORDER — LORAZEPAM 2 MG/ML IJ SOLN
1.0000 mg | INTRAMUSCULAR | Status: DC | PRN
  Filled 2024-01-20: qty 1

## 2024-01-20 MED ORDER — ONDANSETRON HCL 4 MG/2ML IJ SOLN
4.0000 mg | Freq: Four times a day (QID) | INTRAMUSCULAR | Status: DC | PRN
  Administered 2024-01-21: 4 mg via INTRAVENOUS
  Filled 2024-01-20: qty 2

## 2024-01-20 NOTE — Plan of Care (Signed)
 Palliative-  Brief note- full consult note to follow-   Patient with history of bronchiectasis, mod dementia, living in assisted living- admitted with a-fib with RVR, acute respiratory failure due to RSV in the setting of chronic lung disease, acute on chronic heart failure.  She has not been recovering well. Patient had episode of shortness of breath earlier requiring neb and increase in oxygen requirements.  It has been a difficult year with multiple hospitalizations.  Family would like to transition patient's care plan to comfort measures only.  Discussed transition to comfort measures only which includes stopping IV fluids, antibiotics, labs and providing symptom management for SOB, anxiety, nausea, vomiting, and other symptoms of dying.    Nicole [name], AGNP-C Palliative Medicine  No charge

## 2024-01-20 NOTE — Evaluation (Signed)
 Physical Therapy Evaluation Patient Details Name: Nicole [name] MRN: [id number] DOB: 30-Apr-1935 Today's Date: 01/20/2024  History of Present Illness  Pt is a 89 yr old female who presented 01/18/24 due to SOB. Chest xray found to have small pleural effusion and pna.  Pt noted be + for RSV, new afib and became hypotensive. PMH: asthma, hypothyroidism, HTN, HLD, PE, glaucoma, ronchiectasis, COPD, PE, diastolic CHF, mitral regurgitation, tricuspid regurgitation, aortic regurgitation, aortic stenosis  Clinical Impression  Pt confused and uncooperative on entry. PT/OT attempted to have pt get out of bed to change under ware and bed linens.  Pt refuses, requires increased encouragement to get cleaned up. Pt is able to bridge hips easily multiple times to change under ware and linens Pt requiring max-modA for rolling mainly due to resistance. Pt eventually, set up for breakfast. Recommend pt be seen by therapy when her daughter is there. Per notes pt was receiving SNF level care prior to hospitalization. PT recommends <3 hrs therapy in inpatient setting. PT will continue to follow acutely.       If plan is discharge home, recommend the following: A lot of help with walking and/or transfers;A lot of help with bathing/dressing/bathroom;Assistance with cooking/housework;Direct supervision/assist for medications management;Direct supervision/assist for financial management;Assist for transportation;Help with stairs or ramp for entrance;Supervision due to cognitive status   Can travel by private vehicle   No    Equipment Recommendations Other (comment) (needs to be clarified with family)     Functional Status Assessment Patient has had a recent decline in their functional status and demonstrates the ability to make significant improvements in function in a reasonable and predictable amount of time.     Precautions / Restrictions Precautions Precautions: Fall Recall of Precautions/Restrictions:  Impaired Restrictions Weight Bearing Restrictions Per Provider Order: No      Mobility  Bed Mobility Overal bed mobility: Needs Assistance Bed Mobility: Rolling Rolling: Mod assist, Used rails              Transfers                   General transfer comment: pt decline         Balance Overall balance assessment:  (did not come to [facility] for balance to be assessed)                                           Pertinent Vitals/Pain Pain Assessment Pain Assessment: No/denies pain    Home Living Family/patient expects to be discharged to:: Assisted living                 Home Equipment: Rolling Walker (2 wheels);Rollator (4 wheels) Additional Comments: In notes, it appeared they changed from ILF to a nursing home setting. No family was present but pt reported about being "scooted around" in a chair and goign to exercise classes    Prior Function Prior Level of Function : Patient poor historian/Family not available;Needs assist               ADLs Comments: Per last note in 12/24: daughter assists with medication management per pt; pt reports she does not do much cooking or cleaning but is independent with bathing and dressing     Extremity/Trunk Assessment   Upper Extremity Assessment Upper Extremity Assessment: Defer to OT evaluation    Lower Extremity Assessment Lower Extremity Assessment:  Generalized weakness (although able to bridge hips multiple dimes for changing underware and placement of dry pads)    Cervical / Trunk Assessment Cervical / Trunk Assessment: Kyphotic  Communication   Communication Communication: Impaired Factors Affecting Communication: Other (comment) (difficult to follow)    Cognition Arousal: Alert Behavior During Therapy: Restless                             Following commands: Impaired Following commands impaired: Follows one step commands inconsistently     Cueing Cueing  Techniques: Verbal cues, Gestural cues, Tactile cues     General Comments General comments (skin integrity, edema, etc.): SpO2 >90%O2 on RA, bouts of elevated HR with movement max noted 123bpm    Exercises Low Level/ICU Exercises Stabilized Bridging: AROM, 5 reps, Supine   Assessment/Plan    PT Assessment Patient needs continued PT services  PT Problem List Decreased cognition;Decreased safety awareness;Cardiopulmonary status limiting activity       PT Treatment Interventions DME instruction;Gait training;Functional mobility training;Therapeutic activities;Therapeutic exercise;Balance training;Cognitive remediation;Patient/family education;Wheelchair mobility training    PT Goals (Current goals can be found in the Care Plan section)  Acute Rehab PT Goals PT Goal Formulation: Patient unable to participate in goal setting Time For Goal Achievement: 02/03/24 Potential to Achieve Goals: Fair    Frequency Min 2X/week     Co-evaluation   Reason for Co-Treatment: Complexity of the patient's impairments (multi-system involvement);Necessary to address cognition/behavior during functional activity   OT goals addressed during session: ADL's and self-care       AM-PAC PT "6 Clicks" Mobility  Outcome Measure Help needed turning from your back to your side while in a flat bed without using bedrails?: A Lot Help needed moving from lying on your back to sitting on the side of a flat bed without using bedrails?: Total Help needed moving to and from a bed to a chair (including a wheelchair)?: Total Help needed standing up from a chair using your arms (e.g., wheelchair or bedside chair)?: Total Help needed to walk in hospital room?: Total Help needed climbing 3-5 steps with a railing? : Total 6 Click Score: 7    End of Session Equipment Utilized During Treatment: Oxygen Activity Tolerance: Treatment limited secondary to agitation (pt refuses out of bed) Patient left: in bed;with call  bell/phone within reach;with bed alarm set Nurse Communication: Mobility status PT Visit Diagnosis: Muscle weakness (generalized) (M62.81);Difficulty in walking, not elsewhere classified (R26.2);Adult, failure to thrive (R62.7)    Time: 4098-1191 PT Time Calculation (min) (ACUTE ONLY): 47 min   Charges:   PT Evaluation $PT Eval Moderate Complexity: 1 Mod PT Treatments $Therapeutic Activity: 8-22 mins PT General Charges $$ ACUTE PT VISIT: 1 Visit         Jarren Para B. Beverely Risen PT, DPT Acute Rehabilitation Services Please use secure chat or  Call Office [phone]   [facility] 01/20/2024, 11:53 AM

## 2024-01-20 NOTE — Plan of Care (Signed)

## 2024-01-20 NOTE — Hospital Course (Signed)
 The patient is an 89 y.o. female with medical history significant of hypertension, hyperlipidemia, hypothyroidism, bronchiectasis, COPD, PE, diastolic CHF, mitral regurgitation, tricuspid regurgitation, aortic regurgitation, aortic stenosis presented with worsening shortness of breath.  Was noted to be positive for RSV.  Apparently has had a slow decline over the last several weeks to months.  There was concern for volume overload as well.  Cardiology was consulted.  Patient was hospitalized for further management.  Subsequently she continued to not improve and family requested discussion with palliative care and after further goals of care discussion family elected to transition the patient to FULL Comfort Measures.  Assessment and Plan:  Atrial fibrillation with RVR: This appears to be new atrial fibrillation.  Seen by cardiology. Patient was on Eliquis but she was taking it for pulmonary embolism. TSH was noted to be normal. Echocardiogram shows LVEF of 35 to 40%.  Severe aortic valve stenosis was noted.  Other valvular abnormalities were also noted including mild mitral stenosis.Patient was placed on Cardizem infusion but she became hypotensive overnight and then subsequently converted to sinus rhythm.  Currently noted to be on metoprolol twice a day. Eliquis is being discontinued now being transitioned to Comfort Care   Hypotension: Most likely multifactorial including medication effect as well as a degree of hypovolemia. Cardizem infusion was discontinued.  Blood pressures have improved and last BP was 149/97  Acute respiratory failure with hypoxia/RSV/history of COPD/bronchiectasis: Unclear if she uses home O2 at baseline. Became quite hypoxic overnight.  Required nonrebreather briefly. Currently on 4 to 5 L of oxygen by nasal cannula. Chest x-ray suggested multifocal pneumonia.  RSV is noted to be positive.  Procalcitonin 0.69. Patient noted to be on ceftriaxone and azithromycin but now stopped due  to transitioned to Comfort Care. Strep pneumo urinary antigen is negative. Follow-up blood cultures. All medications not in patients Comfort have been discontinued.    Acute on Chronic systolic CHF/multiple valvular abnormalities including severe aortic stenosis: LVEF is 35 to 40%.  Back in 2023 EF is normal. Cardiology was following.  Low blood pressures make it challenging to initiate goal-directed medical treatment. However it also appears that patient has been declining for the past year or so according to cardiology notes.  She is likely not a candidate for aggressive testing or interventions. Cards signed off as she is being transitioned to Comfort Care   Acute Metabolic Encephalopathy: It appears that patient has cognitive impairment at baseline.  Came in with third delirium in the setting of acute illness. Was unresponsive yesterday morning. Prognosis is guarded to poor and now being transitioned to Comfort Care   History of Pulmonary Embolism: Discontinue Apixaban as being transitioned to Comfort Care   Hypothyroidism: On Levothyroxine 75 mcg po Daily but likely will D/C given transition to Comfort Care   Normocytic Anemia: Hgb/Hct went from 11.4/35.0 -> 9.9/31.2. Will not continue to Monitor and Trend as she is being transitioned to Comfort Care  Hypoalbuminemia: Patient's Albumin Trend:  Recent Labs  Lab 01/18/24 1029 01/19/24 0433 01/20/24 0453  ALBUMIN 3.7 3.5 3.3*  -Will not Continue to Monitor and Trend as she is being transitioned to Comfort Care  Goals of care: According to cardiology notes and based on their discussions with patient's daughter patient has had a gradual decline over the past 1 year.  She had to be moved into an independent living facility few weeks ago.  She had continued to decline however.  Now she is poorly responsive.  Prognosis is guarded  to poor.  Patient is already DNR/DNI. Daughter understands that prognosis is guarded.  Palliative Care consulted and GOC  discussion held and patient being transitioned to Comfort Measures now.

## 2024-01-20 NOTE — Consult Note (Signed)
 Consultation Note Date: 01/20/2024   Patient Name: Nicole [name]  DOB: [birth year]  MRN: [id number]  Age / Sex: 89 y.o., female  PCP: Karie Georges, MD Referring Physician: Merlene Laughter, DO  Reason for Consultation:  goals of care  HPI/Patient Profile: 89 y.o. female  with past medical history of advanced dementia, frequent falls, hyptertension, bronchiectasis, COPD, CHF, multivalve regurg, aortic stenosis admitted on 01/18/2024 with shortness of breath. Workup revealed RSV+, volume overload, a-fib with RVR. Palliative consulted for goals of care.    Primary Decision Maker HCPOA - pt daughter  Discussion: Chart reviewed including labs, progress notes, imaging from this and previous encounters.  Discussed with RN and attending MD.  Met with patient's daughter and son in law. Patient born in war time [place] and bronchiectasis has caused suffering through her lifetime.  Daughter described great decline over the last year with frequent hospitalizations, worsening of her dementia and functional status, great deal of weight loss.  We discussed patient's current acute illness and chronic disease trajectory. Patient's mom feels patient is suffering and would not choose to live in this way.  We discussed ongoing aggressive medical interventions vs transition to full comfort measures.  Discussed transition to comfort measures only which includes stopping IV fluids, antibiotics, labs and providing symptom management for SOB, anxiety, nausea, vomiting, and other symptoms of dying.  Nicole [name] was very clear that goal of care for her mom is comfort measures.    SUMMARY OF RECOMMENDATIONS -Transition to full comfort measures only -Comfort meds ordered -Will evaluate tomorrow for disposition- hospital death vs hospice    Code Status/Advance Care Planning:   Code Status: Do not attempt resuscitation (DNR) -  Comfort care    Prognosis:   < 2 weeks  Discharge Planning: To Be Determined  Primary Diagnoses: Present on Admission:  Hypertension  Mixed hyperlipidemia  Hypothyroidism  Chronic obstructive lung disease (HCC)  History of pulmonary embolus (PE)  Chronic diastolic heart failure (HCC)  Atrial fibrillation with RVR (HCC)  Multifocal pneumonia  Acute respiratory failure with hypoxia (HCC)   Review of Systems  Unable to perform ROS: Mental status change    Physical Exam Vitals and nursing note reviewed.  Constitutional:      Appearance: She is ill-appearing.     Comments: Frail, cachectic  Cardiovascular:     Rate and Rhythm: Tachycardia present. Rhythm irregular.  Pulmonary:     Effort: Pulmonary effort is normal.     Comments: Increased oxygen requirements Neurological:     Mental Status: She is alert. She is disoriented.  Psychiatric:     Comments: Agitated at times     Vital Signs: BP (!) 149/97   Pulse (!) 101   Temp (!) 97.5 F (36.4 C) (Oral)   Resp 20   Ht 5' (1.524 m)   Wt 38.5 kg   SpO2 95%   BMI 16.58 kg/m  Pain Scale: 0-10   Pain Score: 0-No pain   SpO2: SpO2: 95 % O2 Device:SpO2: 95 % O2  Flow Rate: .O2 Flow Rate (L/min): 2 L/min  IO: Intake/output summary:  Intake/Output Summary (Last 24 hours) at 01/20/2024 1442 Last data filed at 01/20/2024 0953 Gross per 24 hour  Intake 423.28 ml  Output 1050 ml  Net -626.72 ml    LBM:   Baseline Weight: Weight: 40 kg Most recent weight: Weight: 38.5 kg       Thank you for this consult. Palliative medicine will continue to follow and assist as needed.  Time Total: 90 minutes Signed by: Ocie Bob, AGNP-C Palliative Medicine  Time includes:   Preparing to see the patient (e.g., review of tests) Obtaining and/or reviewing separately obtained history Performing a medically necessary appropriate examination and/or evaluation Counseling and educating the patient/family/caregiver Ordering  medications, tests, or procedures Referring and communicating with other health care professionals (when not reported separately) Documenting clinical information in the electronic or other health record Independently interpreting results (not reported separately) and communicating results to the patient/family/caregiver Care coordination (not reported separately) Clinical documentation   Please contact Palliative Medicine Team phone at [phone] for questions and concerns.  For individual provider: See Loretha Stapler

## 2024-01-20 NOTE — Progress Notes (Signed)
 PROGRESS NOTE    Nicole [name]  GNF:[id number] DOB: [birth year] DOA: 01/18/2024 PCP: Karie Georges, MD   Brief Narrative:  The patient is an 89 y.o. female with medical history significant of hypertension, hyperlipidemia, hypothyroidism, bronchiectasis, COPD, PE, diastolic CHF, mitral regurgitation, tricuspid regurgitation, aortic regurgitation, aortic stenosis presented with worsening shortness of breath.  Was noted to be positive for RSV.  Apparently has had a slow decline over the last several weeks to months.  There was concern for volume overload as well.  Cardiology was consulted.  Patient was hospitalized for further management.  Subsequently she continued to not improve and family requested discussion with palliative care and after further goals of care discussion family elected to transition the patient to FULL Comfort Measures.  Assessment and Plan:  Atrial fibrillation with RVR: This appears to be new atrial fibrillation.  Seen by cardiology. Patient was on Eliquis but she was taking it for pulmonary embolism. TSH was noted to be normal. Echocardiogram shows LVEF of 35 to 40%.  Severe aortic valve stenosis was noted.  Other valvular abnormalities were also noted including mild mitral stenosis.Patient was placed on Cardizem infusion but she became hypotensive overnight and then subsequently converted to sinus rhythm.  Currently noted to be on metoprolol twice a day. Eliquis is being discontinued now being transitioned to Comfort Care   Hypotension: Most likely multifactorial including medication effect as well as a degree of hypovolemia. Cardizem infusion was discontinued.  Blood pressures have improved and last BP was 149/97  Acute respiratory failure with hypoxia/RSV/history of COPD/bronchiectasis: Unclear if she uses home O2 at baseline. Became quite hypoxic overnight.  Required nonrebreather briefly. Currently on 4 to 5 L of oxygen by nasal cannula. Chest x-ray suggested multifocal  pneumonia.  RSV is noted to be positive.  Procalcitonin 0.69. Patient noted to be on ceftriaxone and azithromycin but now stopped due to transitioned to Comfort Care. Strep pneumo urinary antigen is negative. Follow-up blood cultures. All medications not in patients Comfort have been discontinued.    Acute on Chronic systolic CHF/multiple valvular abnormalities including severe aortic stenosis: LVEF is 35 to 40%.  Back in 2023 EF is normal. Cardiology was following.  Low blood pressures make it challenging to initiate goal-directed medical treatment. However it also appears that patient has been declining for the past year or so according to cardiology notes.  She is likely not a candidate for aggressive testing or interventions. Cards signed off as she is being transitioned to Comfort Care   Acute Metabolic Encephalopathy: It appears that patient has cognitive impairment at baseline.  Came in with third delirium in the setting of acute illness. Was unresponsive yesterday morning. Prognosis is guarded to poor and now being transitioned to Comfort Care   History of Pulmonary Embolism: Discontinue Apixaban as being transitioned to Comfort Care   Hypothyroidism: On Levothyroxine 75 mcg po Daily but likely will D/C given transition to Comfort Care   Normocytic Anemia: Hgb/Hct went from 11.4/35.0 -> 9.9/31.2. Will not continue to Monitor and Trend as she is being transitioned to Comfort Care  Hypoalbuminemia: Patient's Albumin Trend:  Recent Labs  Lab 01/18/24 1029 01/19/24 0433 01/20/24 0453  ALBUMIN 3.7 3.5 3.3*  -Will not Continue to Monitor and Trend as she is being transitioned to Comfort Care  Goals of care: According to cardiology notes and based on their discussions with patient's daughter patient has had a gradual decline over the past 1 year.  She had to be moved into  an independent living facility few weeks ago.  She had continued to decline however.  Now she is poorly responsive.   Prognosis is guarded to poor.  Patient is already DNR/DNI. Daughter understands that prognosis is guarded.  Palliative Care consulted and GOC discussion held and patient being transitioned to Comfort Measures now.    DVT prophylaxis: None    Code Status: Do not attempt resuscitation (DNR) - Comfort care Family Communication: Discussed with the patient's daughter at bedside  Disposition Plan:  Level of care: Palliative Care Status is: Inpatient Remains inpatient appropriate because:    Consultants:  Cardiology Palliative Care Medicine  Procedures:  As delineated as above  Antimicrobials:  Anti-infectives (From admission, onward)    Start     Dose/Rate Route Frequency Ordered Stop   01/20/24 0000  cefTRIAXone (ROCEPHIN) 2 g in sodium chloride 0.9 % 100 mL IVPB  Status:  Discontinued        2 g 200 mL/hr over 30 Minutes Intravenous Every 24 hours 01/19/24 0125 01/20/24 1439   01/19/24 0200  azithromycin (ZITHROMAX) 500 mg in sodium chloride 0.9 % 250 mL IVPB  Status:  Discontinued        500 mg 250 mL/hr over 60 Minutes Intravenous Every 24 hours 01/19/24 0104 01/20/24 1439   01/19/24 0145  cefTRIAXone (ROCEPHIN) 2 g in sodium chloride 0.9 % 100 mL IVPB        2 g 200 mL/hr over 30 Minutes Intravenous  Once 01/19/24 0132 01/19/24 0453       Subjective: Seen and examined at bedside and was still feeling little bit short of breath and unable to recall full per secretions.  No nausea or vomiting.  Felt anxious and continue her supplemental oxygen.  Patient's daughter was very tearful and spoke to me outside the room and states that she knows that her mother is sick and is leaning towards comfort care but wants to speak with the palliative care team.  No other concerns or complaints this time.  Objective: Vitals:   01/19/24 1959 01/20/24 0325 01/20/24 0424 01/20/24 0944  BP: (!) 99/58 112/73  (!) 149/97  Pulse:  96  (!) 101  Resp:  20    Temp: 97.8 F (36.6 C) (!) 97.5 F (36.4  C)    TempSrc: Oral Oral    SpO2:  100%  95%  Weight:   38.5 kg   Height:        Intake/Output Summary (Last 24 hours) at 01/20/2024 1802 Last data filed at 01/20/2024 1610 Gross per 24 hour  Intake 423.28 ml  Output 250 ml  Net 173.28 ml   Filed Weights   01/18/24 0906 01/19/24 0206 01/20/24 0424  Weight: 40 kg 40 kg 38.5 kg   Examination: Physical Exam:  Constitutional: Thin elderly chronically ill-appearing Caucasian female who appears a little uncomfortable Respiratory: Diminished to auscultation bilaterally with some coarse breath sounds and does have some rhonchi and some slight crackles.  No appreciable rales or wheezing.  Wearing supplemental oxygen nasal cannula and a little tachypneic. Cardiovascular: RRR, no murmurs / rubs / gallops. S1 and S2 auscultated. No extremity edema. Abdomen: Soft, non-tender, non-distended. Bowel sounds positive.  GU: Deferred. Musculoskeletal: No clubbing / cyanosis of digits/nails. No joint deformity upper and lower extremities.  Skin: No rashes, lesions, ulcers on the skin evaluation on a limited skin evaluation. No induration; Warm and dry.  Neurologic: CN 2-12 grossly intact with no focal deficits. Romberg sign and cerebellar reflexes not assessed.  Psychiatric: Confused  Data Reviewed: I have personally reviewed following labs and imaging studies  CBC: Recent Labs  Lab 01/18/24 1029 01/19/24 0433 01/20/24 0859  WBC 10.4 12.4* 14.4*  NEUTROABS 8.6*  --   --   HGB 10.3* 11.4* 9.9*  HCT 31.8* 35.0* 31.2*  MCV 93.3 93.6 94.5  PLT 268 281 264   Basic Metabolic Panel: Recent Labs  Lab 01/18/24 1029 01/19/24 0433 01/20/24 0453  NA 139 140 142  K 4.1 4.4 3.7  CL 101 99 102  CO2 26 25 28   GLUCOSE 101* 143* 115*  BUN 12 22 40*  CREATININE 0.59 0.80 0.82  CALCIUM 9.5 9.2 8.7*  MG 2.1  --  2.2   GFR: Estimated Creatinine Clearance: 29.4 mL/min (by C-G formula based on SCr of 0.82 mg/dL). Liver Function Tests: Recent Labs   Lab 01/18/24 1029 01/19/24 0433 01/20/24 0453  AST 29 32 36  ALT 28 29 28   ALKPHOS 113 109 80  BILITOT 0.6 1.0 0.5  PROT 7.8 7.1 5.9*  ALBUMIN 3.7 3.5 3.3*   Recent Labs  Lab 01/18/24 1029  LIPASE 22   No results for input(s): "AMMONIA" in the last 168 hours. Coagulation Profile: No results for input(s): "INR", "PROTIME" in the last 168 hours. Cardiac Enzymes: No results for input(s): "CKTOTAL", "CKMB", "CKMBINDEX", "TROPONINI" in the last 168 hours. BNP (last 3 results) No results for input(s): "PROBNP" in the last 8760 hours. HbA1C: No results for input(s): "HGBA1C" in the last 72 hours. CBG: No results for input(s): "GLUCAP" in the last 168 hours. Lipid Profile: No results for input(s): "CHOL", "HDL", "LDLCALC", "TRIG", "CHOLHDL", "LDLDIRECT" in the last 72 hours. Thyroid Function Tests: Recent Labs    01/18/24 1029  TSH 4.127   Anemia Panel: No results for input(s): "VITAMINB12", "FOLATE", "FERRITIN", "TIBC", "IRON", "RETICCTPCT" in the last 72 hours. Sepsis Labs: Recent Labs  Lab 01/18/24 1028 01/19/24 0433  PROCALCITON  --  0.69  LATICACIDVEN 1.5 1.8   Recent Results (from the past 240 hours)  Resp panel by RT-PCR (RSV, Flu A&B, Covid) Nasal Mucosa     Status: Abnormal   Collection Time: 01/18/24  9:13 AM   Specimen: Nasal Mucosa; Nasal Swab  Result Value Ref Range Status   SARS Coronavirus 2 by RT PCR NEGATIVE NEGATIVE Final   Influenza A by PCR NEGATIVE NEGATIVE Final   Influenza B by PCR NEGATIVE NEGATIVE Final    Comment: (NOTE) The Xpert Xpress SARS-CoV-2/FLU/RSV plus assay is intended as an aid in the diagnosis of influenza from Nasopharyngeal swab specimens and should not be used as a sole basis for treatment. Nasal washings and aspirates are unacceptable for Xpert Xpress SARS-CoV-2/FLU/RSV testing.  Fact Sheet for Patients: BloggerCourse.com  Fact Sheet for Healthcare  Providers: SeriousBroker.it  This test is not yet approved or cleared by the [place] FDA and has been authorized for detection and/or diagnosis of SARS-CoV-2 by FDA under an Emergency Use Authorization (EUA). This EUA will remain in effect (meaning this test can be used) for the duration of the COVID-19 declaration under Section 564(b)(1) of the Act, 21 U.S.C. section 360bbb-3(b)(1), unless the authorization is terminated or revoked.     Resp Syncytial Virus by PCR POSITIVE (A) NEGATIVE Final    Comment: (NOTE) Fact Sheet for Patients: BloggerCourse.com  Fact Sheet for Healthcare Providers: SeriousBroker.it  This test is not yet approved or cleared by the [place] FDA and has been authorized for detection and/or diagnosis of SARS-CoV-2 by FDA  under an Emergency Use Authorization (EUA). This EUA will remain in effect (meaning this test can be used) for the duration of the COVID-19 declaration under Section 564(b)(1) of the Act, 21 U.S.C. section 360bbb-3(b)(1), unless the authorization is terminated or revoked.  Performed at [facility], [address] [place], [place] [address]   Respiratory (~20 pathogens) panel by PCR     Status: Abnormal   Collection Time: 01/19/24  1:01 AM   Specimen: Nasopharyngeal Swab; Respiratory  Result Value Ref Range Status   Adenovirus NOT DETECTED NOT DETECTED Final   Coronavirus 229E NOT DETECTED NOT DETECTED Final    Comment: (NOTE) The Coronavirus on the Respiratory Panel, DOES NOT test for the novel  Coronavirus (2019 nCoV)    Coronavirus HKU1 NOT DETECTED NOT DETECTED Final   Coronavirus NL63 NOT DETECTED NOT DETECTED Final   Coronavirus OC43 NOT DETECTED NOT DETECTED Final   Metapneumovirus NOT DETECTED NOT DETECTED Final   Rhinovirus / Enterovirus NOT DETECTED NOT DETECTED Final   Influenza A NOT DETECTED NOT DETECTED Final   Influenza B NOT  DETECTED NOT DETECTED Final   Parainfluenza Virus 1 NOT DETECTED NOT DETECTED Final   Parainfluenza Virus 2 NOT DETECTED NOT DETECTED Final   Parainfluenza Virus 3 NOT DETECTED NOT DETECTED Final   Parainfluenza Virus 4 NOT DETECTED NOT DETECTED Final   Respiratory Syncytial Virus DETECTED (A) NOT DETECTED Final   Bordetella pertussis NOT DETECTED NOT DETECTED Final   Bordetella Parapertussis NOT DETECTED NOT DETECTED Final   Chlamydophila pneumoniae NOT DETECTED NOT DETECTED Final   Mycoplasma pneumoniae NOT DETECTED NOT DETECTED Final    Comment: Performed at [facility], [address] [place], [place] [address]  MRSA Next Gen by PCR, Nasal     Status: None   Collection Time: 01/19/24  2:52 AM  Result Value Ref Range Status   MRSA by PCR Next Gen NOT DETECTED NOT DETECTED Final    Comment: (NOTE) The GeneXpert MRSA Assay (FDA approved for NASAL specimens only), is one component of a comprehensive MRSA colonization surveillance program. It is not intended to diagnose MRSA infection nor to guide or monitor treatment for MRSA infections. Test performance is not FDA approved in patients less than 37 years old. Performed at [facility], [address] [place], [place] [address]   Culture, blood (Routine X 2) w Reflex to ID Panel     Status: None (Preliminary result)   Collection Time: 01/19/24  4:32 AM   Specimen: BLOOD  Result Value Ref Range Status   Specimen Description BLOOD LEFT ANTECUBITAL  Final   Special Requests   Final    BOTTLES DRAWN AEROBIC AND ANAEROBIC Blood Culture results may not be optimal due to an inadequate volume of blood received in culture bottles   Culture   Final    NO GROWTH 1 DAY Performed at [facility], [address] [place], [place] [address]    Report Status PENDING  Incomplete  Culture, blood (Routine X 2) w Reflex to ID Panel     Status: None (Preliminary result)   Collection Time: 01/19/24  4:37 AM   Specimen: BLOOD   Result Value Ref Range Status   Specimen Description BLOOD LEFT ANTECUBITAL  Final   Special Requests   Final    BOTTLES DRAWN AEROBIC AND ANAEROBIC Blood Culture results may not be optimal due to an inadequate volume of blood received in culture bottles   Culture  Final    NO GROWTH 1 DAY Performed at [facility], [address], [place], [place] [address]    Report Status PENDING  Incomplete    Radiology Studies: DG CHEST PORT 1 VIEW Result Date: 01/19/2024 CLINICAL DATA:  Shortness of breath EXAM: PORTABLE CHEST 1 VIEW COMPARISON:  01/18/2024 FINDINGS: Cardiomegaly. Worsening patchy bilateral airspace disease, most progressed in the upper lobes. Small bilateral pleural effusions. No acute bony abnormality. IMPRESSION: Patchy bilateral airspace disease, worsening in the upper lobes concerning for multifocal pneumonia. Electronically Signed   By: Charlett Nose M.D.   On: 01/19/2024 01:03   Scheduled Meds:  levothyroxine  75 mcg Oral Q0600   sodium chloride flush  3 mL Intravenous Q12H   Continuous Infusions:   LOS: 1 day   Marguerita Merles, DO [facility] Available via Epic secure chat 7am-7pm After these hours, please refer to coverage provider listed on [email or web address] 01/20/2024, 6:02 PM

## 2024-01-20 NOTE — Evaluation (Signed)
 Occupational Therapy Evaluation Patient Details Name: Nicole [name] MRN: [id number] DOB: 1935/05/02 Today's Date: 01/20/2024   History of Present Illness   Pt is a 89 yr old female who presented 01/18/24 due to SOB. Chest xray found to have small pleural effusion and pna.  Pt noted be + for RSV, new afib and became hypotensive. PMH: asthma, hypothyroidism, HTN, HLD, PE, glaucoma, ronchiectasis, COPD, PE, diastolic CHF, mitral regurgitation, tricuspid regurgitation, aortic regurgitation, aortic stenosis     Clinical Impressions Pt presented with no family present in room but witihin session kept asking about Selena Batten (daughter). She refused all OOB mobility and needed max encouragement to complete change of bedding and gown as noted to be wet with urine. Pt needed at bed level max to total assist for LE and mod assist for UE ADLS. Pt would complete rolling side to side in bed with moderate assist and use of bed rails. Patient will benefit from continued inpatient follow up therapy, <3 hours/day.      If plan is discharge home, recommend the following:   Two people to help with walking and/or transfers;Two people to help with bathing/dressing/bathroom;Assistance with cooking/housework;Assistance with feeding;Direct supervision/assist for medications management;Direct supervision/assist for financial management;Assist for transportation;Supervision due to cognitive status;Help with stairs or ramp for entrance     Functional Status Assessment   Patient has had a recent decline in their functional status and demonstrates the ability to make significant improvements in function in a reasonable and predictable amount of time.     Equipment Recommendations    (TBD)     Recommendations for Other Services         Precautions/Restrictions   Precautions Precautions: Fall Recall of Precautions/Restrictions: Impaired Restrictions Weight Bearing Restrictions Per Provider Order: No      Mobility Bed Mobility Overal bed mobility: Needs Assistance Bed Mobility: Rolling Rolling: Mod assist, Used rails              Transfers                   General transfer comment: pt decline      Balance                                           ADL either performed or assessed with clinical judgement   ADL Overall ADL's : Needs assistance/impaired Eating/Feeding: Set up;Sitting   Grooming: Moderate assistance   Upper Body Bathing: Minimal assistance;Moderate assistance;Sitting;Bed level   Lower Body Bathing: Maximal assistance;Bed level   Upper Body Dressing : Minimal assistance;Moderate assistance;Bed level   Lower Body Dressing: Maximal assistance;Bed level       Toileting- Clothing Manipulation and Hygiene: Maximal assistance;Total assistance;Bed level               Vision Baseline Vision/History: 1 Wears glasses       Perception         Praxis         Pertinent Vitals/Pain Pain Assessment Pain Assessment: No/denies pain     Extremity/Trunk Assessment Upper Extremity Assessment Upper Extremity Assessment: Generalized weakness   Lower Extremity Assessment Lower Extremity Assessment: Defer to PT evaluation   Cervical / Trunk Assessment Cervical / Trunk Assessment: Kyphotic   Communication Communication Communication: Impaired Factors Affecting Communication: Other (comment) (difficult to follow)   Cognition Arousal: Alert Behavior During Therapy: Restless Cognition: History of cognitive impairments, No  family/caregiver present to determine baseline             OT - Cognition Comments: Difficult to assess as no family present and no notes about how much has changed from baseline                 Following commands: Impaired Following commands impaired: Follows one step commands inconsistently     Cueing  General Comments   Cueing Techniques: Verbal cues;Gestural cues;Tactile cues       Exercises     Shoulder Instructions      Home Living Family/patient expects to be discharged to:: Assisted living                             Home Equipment: Rolling Walker (2 wheels);Rollator (4 wheels)   Additional Comments: In notes, it appeared they changed from ILF to a nursing home setting. No family was present but pt reported about being "scooted around" in a chair and goign to exercise classes      Prior Functioning/Environment Prior Level of Function : Patient poor historian/Family not available;Needs assist               ADLs Comments: Per last note in 12/24: daughter assists with medication management per pt; pt reports she does not do much cooking or cleaning but is independent with bathing and dressing    OT Problem List: Decreased activity tolerance;Impaired balance (sitting and/or standing);Decreased safety awareness;Decreased knowledge of use of DME or AE;Cardiopulmonary status limiting activity   OT Treatment/Interventions: Self-care/ADL training;Therapeutic exercise;Therapeutic activities;Patient/family education;Balance training      OT Goals(Current goals can be found in the care plan section)   Acute Rehab OT Goals Patient Stated Goal: none OT Goal Formulation: Patient unable to participate in goal setting Time For Goal Achievement: 02/03/24 Potential to Achieve Goals: Fair   OT Frequency:  Min 1X/week    Co-evaluation PT/OT/SLP Co-Evaluation/Treatment: Yes Reason for Co-Treatment: Complexity of the patient's impairments (multi-system involvement);Necessary to address cognition/behavior during functional activity   OT goals addressed during session: ADL's and self-care      AM-PAC OT "6 Clicks" Daily Activity     Outcome Measure Help from another person eating meals?: A Little Help from another person taking care of personal grooming?: A Little Help from another person toileting, which includes using toliet, bedpan, or urinal?:  A Lot Help from another person bathing (including washing, rinsing, drying)?: A Lot Help from another person to put on and taking off regular upper body clothing?: A Little Help from another person to put on and taking off regular lower body clothing?: A Lot 6 Click Score: 15   End of Session Nurse Communication: Mobility status  Activity Tolerance: Other (comment) (cognition) Patient left: in bed;with call bell/phone within reach;with bed alarm set  OT Visit Diagnosis: Unsteadiness on feet (R26.81);Other abnormalities of gait and mobility (R26.89);Repeated falls (R29.6);Muscle weakness (generalized) (M62.81)                Time: 1610-9604 OT Time Calculation (min): 40 min Charges:  OT General Charges $OT Visit: 1 Visit OT Evaluation $OT Eval Low Complexity: 1 Low  Presley Raddle OTR/L  Acute Rehab Services  [phone] office number   Alphia Moh 01/20/2024, 11:00 AM

## 2024-01-20 NOTE — TOC Initial Note (Addendum)
 Transition of Care [facility]) - Initial/Assessment Note    Patient Details  Name: Nicole [name] MRN: [id number] Date of Birth: 19-Feb-1935  Transition of Care [facility]) CM/SW Contact:    [name], LCSWA Phone Number: 01/20/2024, 11:59 AM  Clinical Narrative:                  CSW received consult for possible SNF placement at time of discharge.Due to patients current orientation CSW LVM for patients daughter Selena Batten. CSW awaiting call back to discuss PT/OT recommendations of SNF placement at time of discharge. CSW to continue to follow and assist with discharge planning needs.   Update- CSW received call back from patients daughter Selena Batten. CSW spoke with patients daughter regarding PT recommendation of SNF placement for patient at time of discharge.Patients daughter reports PTA patient comes from [facility]. Patients daughter expressed understanding of PT recommendation and politely declined SNF placement at time of discharge. Patient daughter informed CSW that she is interested in speaking with Palliative to discuss GOC for patient. Patients daughter informed CSW that MD is aware and that palliative has been consulted.All questions answered. No further questions reported at this time.       Patient Goals and CMS Choice            Expected Discharge Plan and Services                                              Prior Living Arrangements/Services                       Activities of Daily Living   ADL Screening (condition at time of admission) Independently performs ADLs?: No Does the patient have a NEW difficulty with bathing/dressing/toileting/self-feeding that is expected to last >3 days?: No (needs assist) Does the patient have a NEW difficulty with getting in/out of bed, walking, or climbing stairs that is expected to last >3 days?: No (needs assist up with walker) Does the patient have a NEW difficulty with communication that is expected to last >3 days?: No Is  the patient deaf or have difficulty hearing?: No Does the patient have difficulty seeing, even when wearing glasses/contacts?: 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) Does the patient have difficulty concentrating, remembering, or making decisions?: Yes  Permission Sought/Granted                  Emotional Assessment              Admission diagnosis:  Shortness of breath [R06.02] Tachycardia [R00.0] New onset a-fib (HCC) [I48.91] Elevated brain natriuretic peptide (BNP) level [R79.89] Atrial fibrillation with RVR (HCC) [I48.91] Acute respiratory failure with hypoxia (HCC) [J96.01] Patient Active Problem List   Diagnosis Date Noted   Acute on chronic combined systolic and diastolic congestive heart failure (HCC) 01/19/2024   Acute hypoxic respiratory failure (HCC) 01/19/2024   COPD with acute exacerbation (HCC) 01/19/2024   Acute respiratory failure with hypoxia (HCC) 01/19/2024   Atrial fibrillation with RVR (HCC) 01/18/2024   Acute diastolic heart failure (HCC) 01/18/2024   Failure to thrive in adult 01/18/2024   DNR (do not resuscitate) 01/18/2024   Dupuytren contracture of both hands 07/21/2023   Age-related osteoporosis with current pathological fracture 03/31/2023   Mixed hyperlipidemia 03/31/2023   Nondisplaced fracture of greater trochanter of femur (HCC) 03/12/2023   Nondisplaced fracture  of greater trochanter of right femur (HCC) 03/10/2023   Chronic diastolic heart failure (HCC) 03/10/2023   Nondisplaced intertrochanteric fracture of right femur, initial encounter for closed fracture (HCC) 03/10/2023   History of pulmonary embolus (PE) 11/18/2022   Malnutrition of moderate degree 10/01/2022   Hyponatremia 09/30/2022   Bradycardia 09/29/2022   Hematemesis 09/29/2022   Hyperkalemia 09/29/2022   Multifocal pneumonia 09/29/2022   Advance care planning 09/29/2022   Bronchiectasis (HCC) 07/16/2022   Hypothyroidism 07/16/2022   Chronic obstructive lung disease (HCC)  03/24/2022   Hypertension 03/20/2022   PCP:  Karie Georges, MD Pharmacy:   [facility] [phone] - [place], [place] - [address] [place] [address] Phone: [phone] Fax: [phone]  [facility] - [place], [place] - [address] [place] [address] Phone: [phone] Fax: [phone]     Social Drivers of Health (SDOH) Social History: SDOH Screenings   Food Insecurity: No Food Insecurity (01/18/2024)  Housing: Low Risk  (01/18/2024)  Transportation Needs: No Transportation Needs (01/18/2024)  Utilities: Not At Risk (01/18/2024)  Depression (PHQ2-9): Low Risk  (03/31/2023)  Financial Resource Strain: Low Risk  (07/20/2023)  Physical Activity: Insufficiently Active (07/20/2023)  Social Connections: Socially Isolated (01/18/2024)  Stress: No Stress Concern Present (11/17/2023)   Received from [facility]  Tobacco Use: Low Risk  (01/18/2024)   SDOH Interventions:     Readmission Risk Interventions     No data to display

## 2024-01-21 DIAGNOSIS — B338 Other specified viral diseases: Secondary | ICD-10-CM

## 2024-01-21 DIAGNOSIS — I4891 Unspecified atrial fibrillation: Secondary | ICD-10-CM | POA: Diagnosis not present

## 2024-01-21 DIAGNOSIS — R0602 Shortness of breath: Secondary | ICD-10-CM | POA: Diagnosis not present

## 2024-01-21 DIAGNOSIS — J9601 Acute respiratory failure with hypoxia: Secondary | ICD-10-CM | POA: Diagnosis not present

## 2024-01-21 DIAGNOSIS — Z515 Encounter for palliative care: Secondary | ICD-10-CM | POA: Diagnosis not present

## 2024-01-21 DIAGNOSIS — J471 Bronchiectasis with (acute) exacerbation: Secondary | ICD-10-CM

## 2024-01-21 DIAGNOSIS — I5043 Acute on chronic combined systolic (congestive) and diastolic (congestive) heart failure: Secondary | ICD-10-CM | POA: Diagnosis not present

## 2024-01-21 MED ORDER — HYDROMORPHONE HCL 1 MG/ML IJ SOLN: 0.2500 mg | INTRAMUSCULAR | Status: DC | PRN

## 2024-01-21 MED ORDER — HYDROMORPHONE HCL 1 MG/ML IJ SOLN
0.2500 mg | INTRAMUSCULAR | Status: DC
  Administered 2024-01-21 – 2024-01-22 (×4): 0.25 mg via INTRAVENOUS
  Filled 2024-01-21 (×4): qty 1

## 2024-01-21 NOTE — Progress Notes (Signed)
 PROGRESS NOTE    Nicole [name]  NWG:[id number] DOB: 01-25-1935 DOA: 01/18/2024 PCP: Karie Georges, MD   Brief Narrative:  The patient is an 89 y.o. female with medical history significant of hypertension, hyperlipidemia, hypothyroidism, bronchiectasis, COPD, PE, diastolic CHF, mitral regurgitation, tricuspid regurgitation, aortic regurgitation, aortic stenosis presented with worsening shortness of breath.  Was noted to be positive for RSV.  Apparently has had a slow decline over the last several weeks to months.  There was concern for volume overload as well.  Cardiology was consulted.  Patient was hospitalized for further management.  Subsequently she continued to not improve and family requested discussion with palliative care and after further goals of care discussion family elected to transition the patient to FULL Comfort Measures.  Referral has been made for residential hospice at [facility] and they will evaluate her in the morning.  Her medications have been adjusted by the palliative care team and was changed to hydromorphone and recommending continuing current comfort medications.  Assessment and Plan:  Atrial fibrillation with RVR: This appears to be new atrial fibrillation.  Seen by cardiology. Patient was on Eliquis but she was taking it for pulmonary embolism. TSH was noted to be normal. Echocardiogram shows LVEF of 35 to 40%.  Severe aortic valve stenosis was noted.  Other valvular abnormalities were also noted including mild mitral stenosis.Patient was placed on Cardizem infusion but she became hypotensive overnight and then subsequently converted to sinus rhythm.  Currently noted to be on metoprolol twice a day. Eliquis is being discontinued now being transitioned to Comfort Care   Hypotension: Most likely multifactorial including medication effect as well as a degree of hypovolemia. Cardizem infusion was discontinued.  Blood pressures have improved and last BP was  149/97  Acute respiratory failure with hypoxia/RSV/history of COPD/bronchiectasis: Unclear if she uses home O2 at baseline. Became quite hypoxic overnight.  Required nonrebreather briefly. Currently on 4 to 5 L of oxygen by nasal cannula. Chest x-ray suggested multifocal pneumonia.  RSV is noted to be positive.  Procalcitonin 0.69. Patient noted to be on ceftriaxone and azithromycin but now stopped due to transitioned to Comfort Care. Strep pneumo urinary antigen is negative. Follow-up blood cultures. All medications not in patients Comfort have been discontinued.    Acute on Chronic systolic CHF/multiple valvular abnormalities including severe aortic stenosis: LVEF is 35 to 40%.  Back in 2023 EF is normal. Cardiology was following.  Low blood pressures make it challenging to initiate goal-directed medical treatment. However it also appears that patient has been declining for the past year or so according to cardiology notes.  She is likely not a candidate for aggressive testing or interventions. Cards signed off as she is being transitioned to Comfort Care   Acute Metabolic Encephalopathy: It appears that patient has cognitive impairment at baseline.  Came in with third delirium in the setting of acute illness. Was unresponsive yesterday morning. Prognosis is guarded to poor and now being transitioned to Comfort Care   History of Pulmonary Embolism: Discontinue Apixaban as being transitioned to Comfort Care   Hypothyroidism: On Levothyroxine 75 mcg po Daily but likely will D/C given transition to Comfort Care   Normocytic Anemia: Hgb/Hct went from 11.4/35.0 -> 9.9/31.2. Will not continue to Monitor and Trend as she is being transitioned to Comfort Care  Hypoalbuminemia: Patient's Albumin Trend:  Recent Labs  Lab 01/18/24 1029 01/19/24 0433 01/20/24 0453  ALBUMIN 3.7 3.5 3.3*  -Will not Continue to Monitor and Trend as  she is being transitioned to Comfort Care  Goals of care: According to  cardiology notes and based on their discussions with patient's daughter patient has had a gradual decline over the past 1 year.  She had to be moved into an independent living facility few weeks ago.  She had continued to decline however.  Now she is poorly responsive.  Prognosis is guarded to poor.  Patient is already DNR/DNI. Daughter understands that prognosis is guarded.  Palliative Care consulted and GOC discussion held and patient being transitioned to Comfort Measures now.  Referral has been made to residential hospice at [facility] and they are going to evaluate her for candidacy tomorrow.   DVT prophylaxis: None    Code Status: Do not attempt resuscitation (DNR) - Comfort care Family Communication: No family present at bedside  Disposition Plan:  Level of care: Palliative Care Status is: Inpatient Remains inpatient appropriate because: High risk for further decompensation given that she is comfort care   Consultants:  Cardiology Palliative care medicine  Procedures:  As delineated as above  Antimicrobials:  Anti-infectives (From admission, onward)    Start     Dose/Rate Route Frequency Ordered Stop   01/20/24 0000  cefTRIAXone (ROCEPHIN) 2 g in sodium chloride 0.9 % 100 mL IVPB  Status:  Discontinued        2 g 200 mL/hr over 30 Minutes Intravenous Every 24 hours 01/19/24 0125 01/20/24 1439   01/19/24 0200  azithromycin (ZITHROMAX) 500 mg in sodium chloride 0.9 % 250 mL IVPB  Status:  Discontinued        500 mg 250 mL/hr over 60 Minutes Intravenous Every 24 hours 01/19/24 0104 01/20/24 1439   01/19/24 0145  cefTRIAXone (ROCEPHIN) 2 g in sodium chloride 0.9 % 100 mL IVPB        2 g 200 mL/hr over 30 Minutes Intravenous  Once 01/19/24 0132 01/19/24 0453       Subjective: Seen and examined at bedside and thinks she is doing okay.  Was anxious and short of breath this morning but doing fine today.  States that she continues to always have a cough due to her bronchiectasis.   No other concerns or complaints at this time.  Objective: Vitals:   01/21/24 0800 01/21/24 0930 01/21/24 1100 01/21/24 1423  BP: (!) 175/81  (!) 183/78 (!) 185/117  Pulse: (!) 45   62  Resp: (!) 21 (!) 32 (!) 38 (!) 24  Temp: 98 F (36.7 C)   98 F (36.7 C)  TempSrc: Axillary   Axillary  SpO2:  92%    Weight:      Height:        Intake/Output Summary (Last 24 hours) at 01/21/2024 1736 Last data filed at 01/21/2024 1100 Gross per 24 hour  Intake 64 ml  Output 430 ml  Net -366 ml   Filed Weights   01/18/24 0906 01/19/24 0206 01/20/24 0424  Weight: 40 kg 40 kg 38.5 kg   Examination: Physical Exam:  Constitutional: Extremely thin frail elderly chronically ill-appearing Caucasian female who appears currently in no acute distress Respiratory: Diminished to auscultation bilaterally with some coarse breath sound does have some rhonchi and wheezing and rales.  No appreciable crackles.  Has a slightly elevated respiratory rate and is wearing supplemental oxygen via nasal cannula. Cardiovascular: RRR, no murmurs / rubs / gallops. S1 and S2 auscultated. No extremity edema.   Abdomen: Soft, non-tender, non-distended. Bowel sounds positive.  GU: Deferred. Musculoskeletal: No clubbing /  cyanosis of digits/nails. No joint deformity upper and lower extremities. Skin: No rashes, lesions, ulcers on limited skin evaluation. No induration; Warm and dry.  Neurologic: CN 2-12 grossly intact with no focal deficits. Romberg sign cerebellar reflexes not assessed.  Psychiatric: Continues to have waxing and waning mental status.  Pleasantly demented  Data Reviewed: I have personally reviewed following labs and imaging studies  CBC: Recent Labs  Lab 01/18/24 1029 01/19/24 0433 01/20/24 0859  WBC 10.4 12.4* 14.4*  NEUTROABS 8.6*  --   --   HGB 10.3* 11.4* 9.9*  HCT 31.8* 35.0* 31.2*  MCV 93.3 93.6 94.5  PLT 268 281 264   Basic Metabolic Panel: Recent Labs  Lab 01/18/24 1029 01/19/24 0433  01/20/24 0453  NA 139 140 142  K 4.1 4.4 3.7  CL 101 99 102  CO2 26 25 28   GLUCOSE 101* 143* 115*  BUN 12 22 40*  CREATININE 0.59 0.80 0.82  CALCIUM 9.5 9.2 8.7*  MG 2.1  --  2.2   GFR: Estimated Creatinine Clearance: 29.4 mL/min (by C-G formula based on SCr of 0.82 mg/dL). Liver Function Tests: Recent Labs  Lab 01/18/24 1029 01/19/24 0433 01/20/24 0453  AST 29 32 36  ALT 28 29 28   ALKPHOS 113 109 80  BILITOT 0.6 1.0 0.5  PROT 7.8 7.1 5.9*  ALBUMIN 3.7 3.5 3.3*   Recent Labs  Lab 01/18/24 1029  LIPASE 22   No results for input(s): "AMMONIA" in the last 168 hours. Coagulation Profile: No results for input(s): "INR", "PROTIME" in the last 168 hours. Cardiac Enzymes: No results for input(s): "CKTOTAL", "CKMB", "CKMBINDEX", "TROPONINI" in the last 168 hours. BNP (last 3 results) No results for input(s): "PROBNP" in the last 8760 hours. HbA1C: No results for input(s): "HGBA1C" in the last 72 hours. CBG: No results for input(s): "GLUCAP" in the last 168 hours. Lipid Profile: No results for input(s): "CHOL", "HDL", "LDLCALC", "TRIG", "CHOLHDL", "LDLDIRECT" in the last 72 hours. Thyroid Function Tests: No results for input(s): "TSH", "T4TOTAL", "FREET4", "T3FREE", "THYROIDAB" in the last 72 hours. Anemia Panel: No results for input(s): "VITAMINB12", "FOLATE", "FERRITIN", "TIBC", "IRON", "RETICCTPCT" in the last 72 hours. Sepsis Labs: Recent Labs  Lab 01/18/24 1028 01/19/24 0433  PROCALCITON  --  0.69  LATICACIDVEN 1.5 1.8    Recent Results (from the past 240 hours)  Resp panel by RT-PCR (RSV, Flu A&B, Covid) Nasal Mucosa     Status: Abnormal   Collection Time: 01/18/24  9:13 AM   Specimen: Nasal Mucosa; Nasal Swab  Result Value Ref Range Status   SARS Coronavirus 2 by RT PCR NEGATIVE NEGATIVE Final   Influenza A by PCR NEGATIVE NEGATIVE Final   Influenza B by PCR NEGATIVE NEGATIVE Final    Comment: (NOTE) The Xpert Xpress SARS-CoV-2/FLU/RSV plus assay is  intended as an aid in the diagnosis of influenza from Nasopharyngeal swab specimens and should not be used as a sole basis for treatment. Nasal washings and aspirates are unacceptable for Xpert Xpress SARS-CoV-2/FLU/RSV testing.  Fact Sheet for Patients: BloggerCourse.com  Fact Sheet for Healthcare Providers: SeriousBroker.it  This test is not yet approved or cleared by the [place] FDA and has been authorized for detection and/or diagnosis of SARS-CoV-2 by FDA under an Emergency Use Authorization (EUA). This EUA will remain in effect (meaning this test can be used) for the duration of the COVID-19 declaration under Section 564(b)(1) of the Act, 21 U.S.C. section 360bbb-3(b)(1), unless the authorization is terminated or revoked.  Resp Syncytial Virus by PCR POSITIVE (A) NEGATIVE Final    Comment: (NOTE) Fact Sheet for Patients: BloggerCourse.com  Fact Sheet for Healthcare Providers: SeriousBroker.it  This test is not yet approved or cleared by the [place] FDA and has been authorized for detection and/or diagnosis of SARS-CoV-2 by FDA under an Emergency Use Authorization (EUA). This EUA will remain in effect (meaning this test can be used) for the duration of the COVID-19 declaration under Section 564(b)(1) of the Act, 21 U.S.C. section 360bbb-3(b)(1), unless the authorization is terminated or revoked.  Performed at [facility], [address], [place], [place] [address]   Respiratory (~20 pathogens) panel by PCR     Status: Abnormal   Collection Time: 01/19/24  1:01 AM   Specimen: Nasopharyngeal Swab; Respiratory  Result Value Ref Range Status   Adenovirus NOT DETECTED NOT DETECTED Final   Coronavirus 229E NOT DETECTED NOT DETECTED Final    Comment: (NOTE) The Coronavirus on the Respiratory Panel, DOES NOT test for the novel  Coronavirus (2019 nCoV)     Coronavirus HKU1 NOT DETECTED NOT DETECTED Final   Coronavirus NL63 NOT DETECTED NOT DETECTED Final   Coronavirus OC43 NOT DETECTED NOT DETECTED Final   Metapneumovirus NOT DETECTED NOT DETECTED Final   Rhinovirus / Enterovirus NOT DETECTED NOT DETECTED Final   Influenza A NOT DETECTED NOT DETECTED Final   Influenza B NOT DETECTED NOT DETECTED Final   Parainfluenza Virus 1 NOT DETECTED NOT DETECTED Final   Parainfluenza Virus 2 NOT DETECTED NOT DETECTED Final   Parainfluenza Virus 3 NOT DETECTED NOT DETECTED Final   Parainfluenza Virus 4 NOT DETECTED NOT DETECTED Final   Respiratory Syncytial Virus DETECTED (A) NOT DETECTED Final   Bordetella pertussis NOT DETECTED NOT DETECTED Final   Bordetella Parapertussis NOT DETECTED NOT DETECTED Final   Chlamydophila pneumoniae NOT DETECTED NOT DETECTED Final   Mycoplasma pneumoniae NOT DETECTED NOT DETECTED Final    Comment: Performed at [facility], [address], [place], [place] [address]  MRSA Next Gen by PCR, Nasal     Status: None   Collection Time: 01/19/24  2:52 AM  Result Value Ref Range Status   MRSA by PCR Next Gen NOT DETECTED NOT DETECTED Final    Comment: (NOTE) The GeneXpert MRSA Assay (FDA approved for NASAL specimens only), is one component of a comprehensive MRSA colonization surveillance program. It is not intended to diagnose MRSA infection nor to guide or monitor treatment for MRSA infections. Test performance is not FDA approved in patients less than 30 years old. Performed at [facility], [address] [place], [place] [address]   Culture, blood (Routine X 2) w Reflex to ID Panel     Status: None (Preliminary result)   Collection Time: 01/19/24  4:32 AM   Specimen: BLOOD  Result Value Ref Range Status   Specimen Description BLOOD LEFT ANTECUBITAL  Final   Special Requests   Final    BOTTLES DRAWN AEROBIC AND ANAEROBIC Blood Culture results may not be optimal due to an inadequate volume of blood  received in culture bottles   Culture   Final    NO GROWTH 2 DAYS Performed at [facility], [address] [place], [place] [address]    Report Status PENDING  Incomplete  Culture, blood (Routine X 2) w Reflex to ID Panel     Status: None (Preliminary result)   Collection Time: 01/19/24  4:37 AM   Specimen:  BLOOD  Result Value Ref Range Status   Specimen Description BLOOD LEFT ANTECUBITAL  Final   Special Requests   Final    BOTTLES DRAWN AEROBIC AND ANAEROBIC Blood Culture results may not be optimal due to an inadequate volume of blood received in culture bottles   Culture   Final    NO GROWTH 2 DAYS Performed at [facility], [address], [place], [place] [address]    Report Status PENDING  Incomplete    Radiology Studies: No results found.  Scheduled Meds:   HYDROmorphone (DILAUDID) injection  0.25 mg Intravenous Q4H   levothyroxine  75 mcg Oral Q0600   sodium chloride flush  3 mL Intravenous Q12H   Continuous Infusions:   LOS: 2 days   Marguerita Merles, DO [facility] Available via Epic secure chat 7am-7pm After these hours, please refer to coverage provider listed on [email or web address] 01/21/2024, 5:36 PM

## 2024-01-21 NOTE — TOC Progression Note (Signed)
 Transition of Care [facility]) - Progression Note    Patient Details  Name: Nicole [name] MRN: [id number] Date of Birth: [birth year]  [facility]) CM/SW Contact  [name], LCSWA Phone Number: 01/21/2024, 4:00 PM  Clinical Narrative:     CSW received consult from palliative for [facility] for patient. CSW spoke with patients daughter Nicole [name] who confirmed she is agreeable for CSW to make referral to [facility] for patient. CSW made referral to Shawn P. With [facility] for [place] [facility] for patient. CSW will continue to follow.       Expected Discharge Plan and Services                                               Social Determinants of Health (SDOH) Interventions SDOH Screenings   Food Insecurity: No Food Insecurity (01/18/2024)  Housing: Low Risk  (01/18/2024)  Transportation Needs: No Transportation Needs (01/18/2024)  Utilities: Not At Risk (01/18/2024)  Depression (PHQ2-9): Low Risk  (03/31/2023)  Financial Resource Strain: Low Risk  (07/20/2023)  Physical Activity: Insufficiently Active (07/20/2023)  Social Connections: Socially Isolated (01/18/2024)  Stress: No Stress Concern Present (11/17/2023)   Received from [facility]  Tobacco Use: Low Risk  (01/18/2024)    Readmission Risk Interventions     No data to display

## 2024-01-21 NOTE — Progress Notes (Signed)
 Multiple family members at bedside with more expected this afternoon, per son, Mellody Dance.  Family would like to discuss goals of care and next level of care.  Secure message to multidisciplinary team sent (hospitalist, palliative care, CM, SW).

## 2024-01-21 NOTE — Plan of Care (Signed)
  Problem: Education: Goal: Knowledge of General Education information will improve Description: Including pain rating scale, medication(s)/side effects and non-pharmacologic comfort measures Outcome: Progressing   Problem: Health Behavior/Discharge Planning: Goal: Ability to manage health-related needs will improve Outcome: Progressing   Problem: Clinical Measurements: Goal: Ability to maintain clinical measurements within normal limits will improve Outcome: Progressing Goal: Will remain free from infection Outcome: Progressing Goal: Diagnostic test results will improve Outcome: Progressing Goal: Respiratory complications will improve Outcome: Progressing Goal: Cardiovascular complication will be avoided Outcome: Progressing   Problem: Activity: Goal: Risk for activity intolerance will decrease Outcome: Progressing   Problem: Nutrition: Goal: Adequate nutrition will be maintained Outcome: Progressing   Problem: Coping: Goal: Level of anxiety will decrease Outcome: Progressing   Problem: Elimination: Goal: Will not experience complications related to bowel motility Outcome: Progressing Goal: Will not experience complications related to urinary retention Outcome: Progressing   Problem: Pain Managment: Goal: General experience of comfort will improve and/or be controlled Outcome: Progressing   Problem: Safety: Goal: Ability to remain free from injury will improve Outcome: Progressing   Problem: Skin Integrity: Goal: Risk for impaired skin integrity will decrease Outcome: Progressing   Problem: Education: Goal: Knowledge of disease or condition will improve Outcome: Progressing Goal: Understanding of medication regimen will improve Outcome: Progressing Goal: Individualized Educational Video(s) Outcome: Progressing   Problem: Activity: Goal: Ability to tolerate increased activity will improve Outcome: Progressing   Problem: Cardiac: Goal: Ability to achieve  and maintain adequate cardiopulmonary perfusion will improve Outcome: Progressing   Problem: Health Behavior/Discharge Planning: Goal: Ability to safely manage health-related needs after discharge will improve Outcome: Progressing   Problem: Education: Goal: Knowledge of the prescribed therapeutic regimen will improve Outcome: Progressing   Problem: Coping: Goal: Ability to identify and develop effective coping behavior will improve Outcome: Progressing   Problem: Clinical Measurements: Goal: Quality of life will improve Outcome: Progressing   Problem: Respiratory: Goal: Verbalizations of increased ease of respirations will increase Outcome: Progressing   Problem: Role Relationship: Goal: Family's ability to cope with current situation will improve Outcome: Progressing Goal: Ability to verbalize concerns, feelings, and thoughts to partner or family member will improve Outcome: Progressing   Problem: Pain Management: Goal: Satisfaction with pain management regimen will improve Outcome: Progressing

## 2024-01-21 NOTE — Progress Notes (Signed)
 Daily Progress Note   Patient Name: Nicole [name]       Date: 01/21/2024 DOB: 22-Jul-1935  Age: 89 y.o. MRN#: [id number] Attending Physician: Merlene Laughter, DO Primary Care Physician: Karie Georges, MD Admit Date: 01/18/2024  Reason for Consultation/Follow-up: Establishing goals of care  Patient Profile/HPI:  89 y.o. female  with past medical history of advanced dementia, frequent falls, hyptertension, bronchiectasis, COPD, CHF, multivalve regurg, aortic stenosis admitted on 01/18/2024 with shortness of breath. Workup revealed RSV+, volume overload, a-fib with RVR. Palliative consulted for goals of care.   Transitioned to comfort measures only on 4/2.  Subjective: Chart reviewed including labs, progress notes, imaging from this and previous encounters.  Patient requiring frequent IV morphine for shortness of breath.  Reporting nausea.  Met with family outside of room. Emotional support provided.  We discussed hospice services and philosophy of care.  They would like to have patient evaluated for possible inpatient hospice at [facility].   Review of Systems  Unable to perform ROS: Mental status change     Physical Exam Vitals and nursing note reviewed.  Constitutional:      Appearance: She is ill-appearing.     Comments: cachetic  Cardiovascular:     Rate and Rhythm: Normal rate.  Pulmonary:     Comments: Increased rate and effort Skin:    Coloration: Skin is pale.  Neurological:     Mental Status: She is alert. She is disoriented.             Vital Signs: BP (!) 185/117 (BP Location: Left Arm)   Pulse 62   Temp 98 F (36.7 C) (Axillary)   Resp (!) 24   Ht 5' (1.524 m)   Wt 38.5 kg   SpO2 92%   BMI 16.58 kg/m  SpO2: SpO2: 92 % O2 Device: O2 Device: Nasal  Cannula O2 Flow Rate: O2 Flow Rate (L/min): 2 L/min  Intake/output summary:  Intake/Output Summary (Last 24 hours) at 01/21/2024 1622 Last data filed at 01/21/2024 1100 Gross per 24 hour  Intake 64 ml  Output 430 ml  Net -366 ml   LBM: Last BM Date :  (patient unable to recall date of last bowel movement) Baseline Weight: Weight: 40 kg Most recent weight: Weight: 38.5 kg       Palliative  Assessment/Data: PPS: 20%      Patient Active Problem List   Diagnosis Date Noted   Acute on chronic combined systolic and diastolic congestive heart failure (HCC) 01/19/2024   Acute hypoxic respiratory failure (HCC) 01/19/2024   COPD with acute exacerbation (HCC) 01/19/2024   Acute respiratory failure with hypoxia (HCC) 01/19/2024   Atrial fibrillation with RVR (HCC) 01/18/2024   Acute diastolic heart failure (HCC) 01/18/2024   Failure to thrive in adult 01/18/2024   DNR (do not resuscitate) 01/18/2024   Dupuytren contracture of both hands 07/21/2023   Age-related osteoporosis with current pathological fracture 03/31/2023   Mixed hyperlipidemia 03/31/2023   Nondisplaced fracture of greater trochanter of femur (HCC) 03/12/2023   Nondisplaced fracture of greater trochanter of right femur (HCC) 03/10/2023   Chronic diastolic heart failure (HCC) 03/10/2023   Nondisplaced intertrochanteric fracture of right femur, initial encounter for closed fracture (HCC) 03/10/2023   History of pulmonary embolus (PE) 11/18/2022   Malnutrition of moderate degree 10/01/2022   Hyponatremia 09/30/2022   Bradycardia 09/29/2022   Hematemesis 09/29/2022   Hyperkalemia 09/29/2022   Multifocal pneumonia 09/29/2022   Advance care planning 09/29/2022   Bronchiectasis (HCC) 07/16/2022   Hypothyroidism 07/16/2022   Chronic obstructive lung disease (HCC) 03/24/2022   Hypertension 03/20/2022    Palliative Care Assessment & Plan    Assessment/Recommendations/Plan  Respiratory failure due to RSV in the setting of  severe chronic lung disease- comfort measures only Nausea- possibly due to morphine- will change morphine IV to hydromorphone IV- scheduled doses 0.25mg  q4 hours as well as 15 min as needed for comfort Continue other comfort medications   Code Status:   Code Status: Do not attempt resuscitation (DNR) - Comfort care   Prognosis:  < 2 weeks  Discharge Planning: To Be Determined - family requesting evaluation for inpatient Hospice- they requested [facility] plan was discussed with patient, bedside RN and attending provider.   Thank you for allowing the Palliative Medicine Team to assist in the care of this patient.  Total time:  60 mins Prolonged billing:  Time includes:   Preparing to see the patient (e.g., review of tests) Obtaining and/or reviewing separately obtained history Performing a medically necessary appropriate examination and/or evaluation Counseling and educating the patient/family/caregiver Ordering medications, tests, or procedures Referring and communicating with other health care professionals (when not reported separately) Documenting clinical information in the electronic or other health record Independently interpreting results (not reported separately) and communicating results to the patient/family/caregiver Care coordination (not reported separately) Clinical documentation  Ocie Bob, AGNP-C Palliative Medicine   Please contact Palliative Medicine Team phone at [phone] for questions and concerns.

## 2024-01-22 DIAGNOSIS — J9601 Acute respiratory failure with hypoxia: Secondary | ICD-10-CM | POA: Diagnosis not present

## 2024-01-22 DIAGNOSIS — J441 Chronic obstructive pulmonary disease with (acute) exacerbation: Secondary | ICD-10-CM

## 2024-01-22 DIAGNOSIS — Z515 Encounter for palliative care: Secondary | ICD-10-CM | POA: Diagnosis not present

## 2024-01-22 DIAGNOSIS — I4891 Unspecified atrial fibrillation: Secondary | ICD-10-CM | POA: Diagnosis not present

## 2024-01-22 DIAGNOSIS — I5031 Acute diastolic (congestive) heart failure: Secondary | ICD-10-CM | POA: Diagnosis not present

## 2024-01-24 LAB — CULTURE, BLOOD (ROUTINE X 2)
Culture: NO GROWTH
Culture: NO GROWTH

## 2024-02-18 NOTE — Death Summary Note (Signed)
 DEATH SUMMARY   Patient Details  Name: Nicole [name] MRN: [id number] DOB: 10/25/1934 NWG:[id number], Nicole Bergamo, MD Admission/Discharge Information   Admit Date:  01-21-24  Date of Death: Date of Death: Jan 25, 2024  Time of Death: Time of Death: 27-Jan-1219  Length of Stay: 3   Principle Cause of death: Acute Cardiopulmonary Arrest from Multifocal RSV Pneumonia complicated by Bronchiectasis   Hospital Diagnoses: Principal Problem:   Atrial fibrillation with RVR (HCC) Active Problems:   Multifocal pneumonia   Bronchiectasis (HCC)   Chronic diastolic heart failure (HCC)   Hypertension   Chronic obstructive lung disease (HCC)   History of pulmonary embolus (PE)   Hypothyroidism   Mixed hyperlipidemia   Acute diastolic heart failure (HCC)   Failure to thrive in adult   DNR (do not resuscitate)   Acute on chronic combined systolic and diastolic congestive heart failure (HCC)   Acute hypoxic respiratory failure (HCC)   COPD with acute exacerbation (HCC)   Acute respiratory failure with hypoxia [facility])   Hospital Course: The patient is an 89 y.o. female with medical history significant of hypertension, hyperlipidemia, hypothyroidism, bronchiectasis, COPD, PE, diastolic CHF, mitral regurgitation, tricuspid regurgitation, aortic regurgitation, aortic stenosis presented with worsening shortness of breath.  Was noted to be positive for RSV.  Apparently has had a slow decline over the last several weeks to months.  There was concern for volume overload as well.  Cardiology was consulted.  Patient was hospitalized for further management.  Subsequently she continued to not improve and family requested discussion with palliative care and after further goals of care discussion family elected to transition the patient to FULL Comfort Measures.    Referral had been made for residential hospice at [facility] and prior to their evaluation she further decompensated and became more encephalopathic and  unresponsive.  Prognosis was extremely poor and in-hospital death was anticipated so held off on inpatient residential hospice evaluation.  Her medications have been adjusted by the palliative care team and was changed to hydromorphone and recommending continuing current comfort medications.  She continued to worsen and subsequently became more unresponsive and started having short of shallow breathing and as expected had a in-hospital death with family at bedside at 1220 on 01/25/2024.  Assessment and Plan:  Acute respiratory failure with hypoxia/RSV/history of COPD/bronchiectasis: Unclear if she uses home O2 at baseline. Became quite hypoxic overnight.  Required nonrebreather briefly. Currently on 4 to 5 L of oxygen by nasal cannula. Chest x-ray suggested multifocal pneumonia.  RSV is noted to be positive.  Procalcitonin 0.69. Patient noted to be on ceftriaxone and azithromycin but now stopped due to transitioned to Comfort Care. Strep pneumo urinary antigen is negative. Blood cultures x2 showed NGTD at 3 Days. All medications not in patients Comfort have been discontinued.   Atrial fibrillation with RVR: This appears to be new atrial fibrillation.  Seen by cardiology. Patient was on Eliquis but she was taking it for pulmonary embolism. TSH was noted to be normal. Echocardiogram shows LVEF of 35 to 40%.  Severe aortic valve stenosis was noted.  Other valvular abnormalities were also noted including mild mitral stenosis.Patient was placed on Cardizem infusion but she became hypotensive overnight and then subsequently converted to sinus rhythm.  Currently noted to be on metoprolol twice a day. Eliquis is being discontinued now being transitioned to Comfort Care   Hypotension: Most likely multifactorial including medication effect as well as a degree of hypovolemia. Cardizem infusion was discontinued.  Blood pressures have  improved and last BP was 149/97   Acute on Chronic systolic CHF/multiple valvular  abnormalities including severe aortic stenosis: LVEF is 35 to 40%.  Back in 2023 EF is normal. Cardiology was following.  Low blood pressures make it challenging to initiate goal-directed medical treatment. However it also appears that patient has been declining for the past year or so according to cardiology notes.  She is likely not a candidate for aggressive testing or interventions. Cards signed off as she is being transitioned to Comfort Care   Acute Metabolic Encephalopathy: It appears that patient has cognitive impairment at baseline.  Came in with third delirium in the setting of acute illness. Was unresponsive earlier on in the admission and improved but now worsened in the setting of being comfort care. Prognosis is guarded to poor and now being transitioned to Comfort Care and as expected she passed away    History of Pulmonary Embolism: Discontinue Apixaban as being transitioned to Comfort Care   Hypothyroidism: On Levothyroxine 75 mcg po Daily but likely will D/C given transition to Comfort Care   Normocytic Anemia: Hgb/Hct went from 11.4/35.0 -> 9.9/31.2 on last check . Will not continue to Monitor and Trend as she is being transitioned to Comfort Care  Hypoalbuminemia: Patient's Albumin Trend:  Recent Labs  Lab 01/18/24 1029 01/19/24 0433 01/20/24 0453  ALBUMIN 3.7 3.5 3.3*  -Will not Continue to Monitor and Trend as she is being transitioned to Comfort Care  Goals of care: According to cardiology notes and based on their discussions with patient's daughter patient has had a gradual decline over the past 1 year.  She had to be moved into an independent living facility few weeks ago.  She had continued to decline however.  Now she is poorly responsive.  Prognosis is guarded to poor.  Patient is already DNR/DNI. Daughter understands that prognosis is guarded.  Palliative Care consulted and GOC discussion held and patient being transitioned to Comfort Measures now.  Referral has been  made to residential hospice at [facility] and they are going to evaluate her for candidacy tomorrow.  Procedures: As delineated as above  Consultations: Cardiology, Palliative Care Medicine   The results of significant diagnostics from this hospitalization (including imaging, microbiology, ancillary and laboratory) are listed below for reference.   Significant Diagnostic Studies: DG CHEST PORT 1 VIEW Result Date: 01/19/2024 CLINICAL DATA:  Shortness of breath EXAM: PORTABLE CHEST 1 VIEW COMPARISON:  01/18/2024 FINDINGS: Cardiomegaly. Worsening patchy bilateral airspace disease, most progressed in the upper lobes. Small bilateral pleural effusions. No acute bony abnormality. IMPRESSION: Patchy bilateral airspace disease, worsening in the upper lobes concerning for multifocal pneumonia. Electronically Signed   By: Charlett Nose M.D.   On: 01/19/2024 01:03   ECHOCARDIOGRAM COMPLETE Result Date: 01/18/2024    ECHOCARDIOGRAM REPORT   Patient Name:   Nicole [name] Date of Exam: 01/18/2024 Medical Rec #:  [id number]     Height:       60.0 in Accession #:    [id number]    Weight:       88.2 lb Date of Birth:  [birth year]     BSA:          1.318 m Patient Age:    89 years      BP:           140/94 mmHg Patient Gender: F             HR:  149 bpm. Exam Location:  Inpatient Procedure: 2D Echo, Cardiac Doppler and Color Doppler (Both Spectral and Color            Flow Doppler were utilized during procedure). Indications:    Atrial fibrillation, Congestive heart failure  History:        Patient has prior history of Echocardiogram examinations. CHF,                 Arrythmias:Atrial Fibrillation and Bradycardia; Risk                 Factors:Hypertension.  Sonographer:    Lamont Snowball Referring Phys: Cecille Po MELVIN IMPRESSIONS  1. Left ventricular ejection fraction, by estimation, is 35 to 40%. The left ventricle has moderately decreased function. The left ventricle demonstrates global hypokinesis. There is  mild concentric left ventricular hypertrophy. Left ventricular diastolic function could not be evaluated.  2. Right ventricular systolic function is moderately reduced. The right ventricular size is normal. There is severely elevated pulmonary artery systolic pressure.  3. Left atrial size was severely dilated.  4. The mitral valve is degenerative. Moderate mitral valve regurgitation. Mild mitral stenosis. The mean mitral valve gradient is 9.0 mmHg with average heart rate of 150 bpm. Moderate to severe mitral annular calcification.  5. Tricuspid valve regurgitation is moderate.  6. The aortic valve is tricuspid. There is severe calcifcation of the aortic valve. There is severe thickening of the aortic valve. Aortic valve regurgitation is mild. Severe aortic valve stenosis.  7. There is Moderate (Grade III) protruding plaque involving the descending aorta.  8. The inferior vena cava is dilated in size with <50% respiratory variability, suggesting right atrial pressure of 15 mmHg. Comparison(s): The left ventricular function is significantly worse. The right ventricular systolic function is significantly worse. There is probably severe low flow low gradient aortic stenosis. The increased mitral valve gradients are primarily due to  tachycardia and not due to valve disease progression. Conclusion(s)/Recommendation(s): There is severe tachycardia (150 bpm) during the study, the rhythm may be atrial flutter. Recommend repeating the study at physiological heart rates. FINDINGS  Left Ventricle: Left ventricular ejection fraction, by estimation, is 35 to 40%. The left ventricle has moderately decreased function. The left ventricle demonstrates global hypokinesis. The left ventricular internal cavity size was normal in size. There is mild concentric left ventricular hypertrophy. Left ventricular diastolic function could not be evaluated due to atrial fibrillation. Left ventricular diastolic function could not be evaluated.  Right Ventricle: The right ventricular size is normal. No increase in right ventricular wall thickness. Right ventricular systolic function is moderately reduced. There is severely elevated pulmonary artery systolic pressure. The tricuspid regurgitant velocity is 3.40 m/s, and with an assumed right atrial pressure of 15 mmHg, the estimated right ventricular systolic pressure is 61.2 mmHg. Left Atrium: Left atrial size was severely dilated. Right Atrium: Right atrial size was normal in size. Prominent Chiari network and Prominent Eustachian valve. Pericardium: There is no evidence of pericardial effusion. Mitral Valve: The mitral valve is degenerative in appearance. Moderate to severe mitral annular calcification. Moderate mitral valve regurgitation, with centrally-directed jet. Mild mitral valve stenosis. MV peak gradient, 11.0 mmHg. The mean mitral valve gradient is 9.0 mmHg with average heart rate of 150 bpm. Tricuspid Valve: The tricuspid valve is normal in structure. Tricuspid valve regurgitation is moderate. Aortic Valve: The aortic valve is tricuspid. There is severe calcifcation of the aortic valve. There is severe thickening of the aortic valve. Aortic valve regurgitation is  mild. Severe aortic stenosis is present. Aortic valve mean gradient measures 33.0  mmHg. Aortic valve peak gradient measures 51.1 mmHg. Aortic valve area, by VTI measures 0.64 cm. Pulmonic Valve: The pulmonic valve was grossly normal. Pulmonic valve regurgitation is not visualized. No evidence of pulmonic stenosis. Aorta: The aortic root and ascending aorta are structurally normal, with no evidence of dilitation. There is moderate (Grade III) protruding plaque involving the descending aorta. Venous: The inferior vena cava is dilated in size with less than 50% respiratory variability, suggesting right atrial pressure of 15 mmHg. IAS/Shunts: No atrial level shunt detected by color flow Doppler.  LEFT VENTRICLE PLAX 2D LVIDd:         3.60  cm LVIDs:         2.70 cm LV PW:         1.20 cm LV IVS:        1.20 cm LVOT diam:     1.90 cm LV SV:         34 LV SV Index:   26 LVOT Area:     2.84 cm  RIGHT VENTRICLE          IVC RV Basal diam:  3.70 cm  IVC diam: 2.30 cm TAPSE (M-mode): 1.5 cm LEFT ATRIUM             Index        RIGHT ATRIUM           Index LA diam:        3.40 cm 2.58 cm/m   RA Area:     10.70 cm LA Vol (A2C):   74.8 ml 56.75 ml/m  RA Volume:   22.20 ml  16.84 ml/m LA Vol (A4C):   89.2 ml 67.68 ml/m LA Biplane Vol: 82.4 ml 62.52 ml/m  AORTIC VALVE AV Area (Vmax):    0.70 cm AV Area (Vmean):   0.66 cm AV Area (VTI):     0.64 cm AV Vmax:           357.33 cm/s AV Vmean:          267.000 cm/s AV VTI:            0.532 m AV Peak Grad:      51.1 mmHg AV Mean Grad:      33.0 mmHg LVOT Vmax:         88.50 cm/s LVOT Vmean:        61.700 cm/s LVOT VTI:          0.121 m LVOT/AV VTI ratio: 0.23  AORTA Ao Root diam: 2.90 cm Ao Asc diam:  3.00 cm MITRAL VALVE                TRICUSPID VALVE MV Area (PHT): 5.38 cm     TR Peak grad:   46.2 mmHg MV Area VTI:   2.35 cm     TR Vmax:        340.00 cm/s MV Peak grad:  11.0 mmHg MV Mean grad:  9.0 mmHg     SHUNTS MV Vmax:       1.66 m/s     Systemic VTI:  0.12 m MV Vmean:      125.5 cm/s   Systemic Diam: 1.90 cm MV Decel Time: 141 msec MV E velocity: 153.00 cm/s Rachelle Hora Croitoru MD Electronically signed by Thurmon Fair MD Signature Date/Time: 01/18/2024/5:56:58 PM    Final    DG Chest Portable 1 View Result Date: 01/18/2024 CLINICAL  DATA:  Cough, shortness of breath, fatigue, atrial fibrillation and right arm pain. EXAM: PORTABLE CHEST 1 VIEW COMPARISON:  10/17/2023 FINDINGS: Stable mildly enlarged cardiac silhouette. Interval patchy density at the right lung base with less prominent patchy density at the left lung base. Small bilateral pleural effusions. Mild increase in prominence of the interstitial markings with some Kerley lines. Mild increase in prominence of the pulmonary vasculature. Diffuse  osteopenia. IMPRESSION: 1. Mild congestive heart failure. 2. Bibasilar atelectasis, pneumonia or alveolar edema, new on the right and less prominent on the left. 3. Small bilateral pleural effusions. Electronically Signed   By: Beckie Salts M.D.   On: 01/18/2024 15:45   Microbiology: Recent Results (from the past 240 hours)  Resp panel by RT-PCR (RSV, Flu A&B, Covid) Nasal Mucosa     Status: Abnormal   Collection Time: 01/18/24  9:13 AM   Specimen: Nasal Mucosa; Nasal Swab  Result Value Ref Range Status   SARS Coronavirus 2 by RT PCR NEGATIVE NEGATIVE Final   Influenza A by PCR NEGATIVE NEGATIVE Final   Influenza B by PCR NEGATIVE NEGATIVE Final    Comment: (NOTE) The Xpert Xpress SARS-CoV-2/FLU/RSV plus assay is intended as an aid in the diagnosis of influenza from Nasopharyngeal swab specimens and should not be used as a sole basis for treatment. Nasal washings and aspirates are unacceptable for Xpert Xpress SARS-CoV-2/FLU/RSV testing.  Fact Sheet for Patients: BloggerCourse.com  Fact Sheet for Healthcare Providers: SeriousBroker.it  This test is not yet approved or cleared by the [place] FDA and has been authorized for detection and/or diagnosis of SARS-CoV-2 by FDA under an Emergency Use Authorization (EUA). This EUA will remain in effect (meaning this test can be used) for the duration of the COVID-19 declaration under Section 564(b)(1) of the Act, 21 U.S.C. section 360bbb-3(b)(1), unless the authorization is terminated or revoked.     Resp Syncytial Virus by PCR POSITIVE (A) NEGATIVE Final    Comment: (NOTE) Fact Sheet for Patients: BloggerCourse.com  Fact Sheet for Healthcare Providers: SeriousBroker.it  This test is not yet approved or cleared by the [place] FDA and has been authorized for detection and/or diagnosis of SARS-CoV-2 by FDA under an Emergency  Use Authorization (EUA). This EUA will remain in effect (meaning this test can be used) for the duration of the COVID-19 declaration under Section 564(b)(1) of the Act, 21 U.S.C. section 360bbb-3(b)(1), unless the authorization is terminated or revoked.  Performed at [facility], [address] [place], [place] [address]   Respiratory (~20 pathogens) panel by PCR     Status: Abnormal   Collection Time: 01/19/24  1:01 AM   Specimen: Nasopharyngeal Swab; Respiratory  Result Value Ref Range Status   Adenovirus NOT DETECTED NOT DETECTED Final   Coronavirus 229E NOT DETECTED NOT DETECTED Final    Comment: (NOTE) The Coronavirus on the Respiratory Panel, DOES NOT test for the novel  Coronavirus (2019 nCoV)    Coronavirus HKU1 NOT DETECTED NOT DETECTED Final   Coronavirus NL63 NOT DETECTED NOT DETECTED Final   Coronavirus OC43 NOT DETECTED NOT DETECTED Final   Metapneumovirus NOT DETECTED NOT DETECTED Final   Rhinovirus / Enterovirus NOT DETECTED NOT DETECTED Final   Influenza A NOT DETECTED NOT DETECTED Final   Influenza B NOT DETECTED NOT DETECTED Final   Parainfluenza Virus 1 NOT DETECTED NOT DETECTED Final   Parainfluenza Virus 2 NOT DETECTED NOT DETECTED Final   Parainfluenza Virus 3 NOT DETECTED NOT DETECTED Final  Parainfluenza Virus 4 NOT DETECTED NOT DETECTED Final   Respiratory Syncytial Virus DETECTED (A) NOT DETECTED Final   Bordetella pertussis NOT DETECTED NOT DETECTED Final   Bordetella Parapertussis NOT DETECTED NOT DETECTED Final   Chlamydophila pneumoniae NOT DETECTED NOT DETECTED Final   Mycoplasma pneumoniae NOT DETECTED NOT DETECTED Final    Comment: Performed at [facility], [address], [place], [place] [address]  MRSA Next Gen by PCR, Nasal     Status: None   Collection Time: 01/19/24  2:52 AM  Result Value Ref Range Status   MRSA by PCR Next Gen NOT DETECTED NOT DETECTED Final    Comment: (NOTE) The GeneXpert MRSA Assay (FDA approved for  NASAL specimens only), is one component of a comprehensive MRSA colonization surveillance program. It is not intended to diagnose MRSA infection nor to guide or monitor treatment for MRSA infections. Test performance is not FDA approved in patients less than 46 years old. Performed at [facility], [address], [place], [place] [address]   Culture, blood (Routine X 2) w Reflex to ID Panel     Status: None (Preliminary result)   Collection Time: 01/19/24  4:32 AM   Specimen: BLOOD  Result Value Ref Range Status   Specimen Description BLOOD LEFT ANTECUBITAL  Final   Special Requests   Final    BOTTLES DRAWN AEROBIC AND ANAEROBIC Blood Culture results may not be optimal due to an inadequate volume of blood received in culture bottles   Culture   Final    NO GROWTH 3 DAYS Performed at [facility], [address] [place], [place] [address]    Report Status PENDING  Incomplete  Culture, blood (Routine X 2) w Reflex to ID Panel     Status: None (Preliminary result)   Collection Time: 01/19/24  4:37 AM   Specimen: BLOOD  Result Value Ref Range Status   Specimen Description BLOOD LEFT ANTECUBITAL  Final   Special Requests   Final    BOTTLES DRAWN AEROBIC AND ANAEROBIC Blood Culture results may not be optimal due to an inadequate volume of blood received in culture bottles   Culture   Final    NO GROWTH 3 DAYS Performed at [facility], [address] [place], [place] [address]    Report Status PENDING  Incomplete   Time spent: 35 minutes  Signed: Marguerita Merles, DO [facility] 

## 2024-02-18 NOTE — Progress Notes (Signed)
 Patient noted not responding and having shallow breathing upon bedside shift change report. Daughter [name] called @ [phone] and updated on mother condition. States will be getting ready and come up shortly. Attending Provider made aware via secured chat. Mikal Plane, BSN, RN

## 2024-02-18 NOTE — Progress Notes (Signed)
 Respirations progressed to irregular and agonal with pauses. Daughter and three other family members at the bedside. Expired at 1220. Attending Physician notified. Pronounced  dead by 2 RNs. [employer] documentation completed and patient deemed not suitable. Post mortem care performed and flowsheet updated with [facility] services information provided by family. Body transported to the morgue. Mikal Plane, BSN, RN

## 2024-02-18 NOTE — Progress Notes (Signed)
 Daily Progress Note   Patient Name: Nicole [name]       Date:  DOB: [birth year]  Age: 89 y.o. MRN#: [id number] Attending Physician: Merlene Laughter, DO Primary Care Physician: Karie Georges, MD Admit Date: 01/18/2024  Reason for Consultation/Follow-up: Establishing goals of care  Patient Profile/HPI:  89 y.o. female  with past medical history of advanced dementia, frequent falls, hyptertension, bronchiectasis, COPD, CHF, multivalve regurg, aortic stenosis admitted on 01/18/2024 with shortness of breath. Workup revealed RSV+, volume overload, a-fib with RVR. Palliative consulted for goals of care.   Transitioned to comfort measures only on 4/2.  Subjective: Chart reviewed including labs, progress notes, imaging from this and previous encounters.  Patient had decline overnight. She is now actively dying. Unresponsive.  Loss of jaw motor control, nasolabial flattening.  Family at bedside.  I do not think she is stable for transfer out of hospital. Family agrees. Emotional support provided as family reflected on their journey with patient.   Review of Systems  Unable to perform ROS: Mental status change     Physical Exam Vitals and nursing note reviewed.  Constitutional:      Appearance: She is ill-appearing.     Comments: cachetic  Cardiovascular:     Rate and Rhythm: Normal rate.  Pulmonary:     Comments: Increased rate and effort, audible terminal secretions Skin:    Coloration: Skin is pale.  Neurological:     Comments: unresponsive             Vital Signs: BP 131/84 (BP Location: Left Arm)   Pulse (!) 117   Temp 98.1 F (36.7 C) (Axillary)   Resp (!) 24   Ht 5' (1.524 m)   Wt 38.5 kg   SpO2 93%   BMI 16.58 kg/m  SpO2: SpO2: 93 % O2 Device: O2 Device:  Nasal Cannula O2 Flow Rate: O2 Flow Rate (L/min): 2 L/min  Intake/output summary:  Intake/Output Summary (Last 24 hours) at  1012 Last data filed at  1914 Gross per 24 hour  Intake 120 ml  Output 220 ml  Net -100 ml   LBM: Last BM Date :  (patient unable to recall date of last bowel movement) Baseline Weight: Weight: 40 kg Most recent weight: Weight: 38.5 kg       Palliative Assessment/Data:  PPS: 20%      Patient Active Problem List   Diagnosis Date Noted   Acute on chronic combined systolic and diastolic congestive heart failure (HCC) 01/19/2024   Acute hypoxic respiratory failure (HCC) 01/19/2024   COPD with acute exacerbation (HCC) 01/19/2024   Acute respiratory failure with hypoxia (HCC) 01/19/2024   Atrial fibrillation with RVR (HCC) 01/18/2024   Acute diastolic heart failure (HCC) 01/18/2024   Failure to thrive in adult 01/18/2024   DNR (do not resuscitate) 01/18/2024   Dupuytren contracture of both hands 07/21/2023   Age-related osteoporosis with current pathological fracture 03/31/2023   Mixed hyperlipidemia 03/31/2023   Nondisplaced fracture of greater trochanter of femur (HCC) 03/12/2023   Nondisplaced fracture of greater trochanter of right femur (HCC) 03/10/2023   Chronic diastolic heart failure (HCC) 03/10/2023   Nondisplaced intertrochanteric fracture of right femur, initial encounter for closed fracture (HCC) 03/10/2023   History of pulmonary embolus (PE) 11/18/2022   Malnutrition of moderate degree 10/01/2022   Hyponatremia 09/30/2022   Bradycardia 09/29/2022   Hematemesis 09/29/2022   Hyperkalemia 09/29/2022   Multifocal pneumonia 09/29/2022   Advance care planning 09/29/2022   Bronchiectasis (HCC) 07/16/2022   Hypothyroidism 07/16/2022   Chronic obstructive lung disease (HCC) 03/24/2022   Hypertension 03/20/2022    Palliative Care Assessment & Plan    Assessment/Recommendations/Plan  Respiratory failure due to RSV in the  setting of severe chronic lung disease- comfort measures only Continue IV hydromorphone and other comfort measures Anticipate hospital death   Code Status:   Code Status: Do not attempt resuscitation (DNR) - Comfort care   Prognosis:  Hours - Days  Discharge Planning: Anticipated Hospital Death   Care plan was discussed with patient's family, bedside RN and attending provider.   Thank you for allowing the Palliative Medicine Team to assist in the care of this patient.  Total time:   Prolonged billing:  Time includes:   Preparing to see the patient (e.g., review of tests) Obtaining and/or reviewing separately obtained history Performing a medically necessary appropriate examination and/or evaluation Counseling and educating the patient/family/caregiver Ordering medications, tests, or procedures Referring and communicating with other health care professionals (when not reported separately) Documenting clinical information in the electronic or other health record Independently interpreting results (not reported separately) and communicating results to the patient/family/caregiver Care coordination (not reported separately) Clinical documentation  Ocie Bob, AGNP-C Palliative Medicine   Please contact Palliative Medicine Team phone at [phone] for questions and concerns.

## 2024-02-18 DEATH — deceased
